# Patient Record
Sex: Female | Born: 1955
Health system: Southern US, Community
[De-identification: ages and names within clinical notes are randomized; demographics above are authoritative.]

## PROBLEM LIST (undated history)

## (undated) DIAGNOSIS — K635 Polyp of colon: Secondary | ICD-10-CM

## (undated) DIAGNOSIS — N159 Renal tubulo-interstitial disease, unspecified: Secondary | ICD-10-CM

## (undated) DIAGNOSIS — K589 Irritable bowel syndrome without diarrhea: Secondary | ICD-10-CM

## (undated) DIAGNOSIS — E890 Postprocedural hypothyroidism: Secondary | ICD-10-CM

## (undated) DIAGNOSIS — I499 Cardiac arrhythmia, unspecified: Secondary | ICD-10-CM

## (undated) DIAGNOSIS — F329 Major depressive disorder, single episode, unspecified: Secondary | ICD-10-CM

## (undated) DIAGNOSIS — M8580 Other specified disorders of bone density and structure, unspecified site: Secondary | ICD-10-CM

## (undated) DIAGNOSIS — I7300 Raynaud's syndrome without gangrene: Secondary | ICD-10-CM

## (undated) DIAGNOSIS — M1990 Unspecified osteoarthritis, unspecified site: Secondary | ICD-10-CM

## (undated) DIAGNOSIS — R002 Palpitations: Secondary | ICD-10-CM

## (undated) DIAGNOSIS — E785 Hyperlipidemia, unspecified: Secondary | ICD-10-CM

## (undated) DIAGNOSIS — M67919 Unspecified disorder of synovium and tendon, unspecified shoulder: Secondary | ICD-10-CM

## (undated) DIAGNOSIS — F32A Depression, unspecified: Secondary | ICD-10-CM

## (undated) HISTORY — DX: Major depressive disorder, single episode, unspecified: F32.9

## (undated) HISTORY — DX: Depression, unspecified: F32.A

## (undated) HISTORY — DX: Unspecified disorder of synovium and tendon, unspecified shoulder: M67.919

## (undated) HISTORY — PX: ROTATOR CUFF REPAIR: SHX139

## (undated) HISTORY — DX: Irritable bowel syndrome, unspecified: K58.9

## (undated) HISTORY — DX: Palpitations: R00.2

## (undated) HISTORY — PX: THYROID LOBECTOMY: SHX420

## (undated) HISTORY — DX: Other specified disorders of bone density and structure, unspecified site: M85.80

## (undated) HISTORY — DX: Raynaud's syndrome without gangrene: I73.00

## (undated) HISTORY — DX: Renal tubulo-interstitial disease, unspecified: N15.9

## (undated) HISTORY — DX: Cardiac arrhythmia, unspecified: I49.9

## (undated) HISTORY — DX: Postprocedural hypothyroidism: E89.0

## (undated) HISTORY — DX: Polyp of colon: K63.5

## (undated) HISTORY — DX: Hyperlipidemia, unspecified: E78.5

## (undated) HISTORY — DX: Unspecified osteoarthritis, unspecified site: M19.90

---

## 1999-03-08 ENCOUNTER — Other Ambulatory Visit: Admission: RE | Admit: 1999-03-08 | Discharge: 1999-03-08 | Payer: Self-pay | Admitting: Gynecology

## 1999-05-11 ENCOUNTER — Other Ambulatory Visit: Admission: RE | Admit: 1999-05-11 | Discharge: 1999-05-11 | Payer: Self-pay | Admitting: *Deleted

## 1999-08-21 ENCOUNTER — Other Ambulatory Visit: Admission: RE | Admit: 1999-08-21 | Discharge: 1999-08-21 | Payer: Self-pay | Admitting: Gynecology

## 2000-05-30 ENCOUNTER — Other Ambulatory Visit: Admission: RE | Admit: 2000-05-30 | Discharge: 2000-05-30 | Payer: Self-pay | Admitting: Gynecology

## 2000-06-24 ENCOUNTER — Other Ambulatory Visit: Admission: RE | Admit: 2000-06-24 | Discharge: 2000-06-24 | Payer: Self-pay | Admitting: Gynecology

## 2000-06-24 ENCOUNTER — Other Ambulatory Visit: Admission: RE | Admit: 2000-06-24 | Discharge: 2000-06-24 | Payer: Self-pay | Admitting: Internal Medicine

## 2000-06-24 ENCOUNTER — Encounter (INDEPENDENT_AMBULATORY_CARE_PROVIDER_SITE_OTHER): Payer: Self-pay | Admitting: Specialist

## 2000-09-18 ENCOUNTER — Encounter: Admission: RE | Admit: 2000-09-18 | Discharge: 2000-09-18 | Payer: Self-pay | Admitting: Gynecology

## 2000-09-18 ENCOUNTER — Encounter: Payer: Self-pay | Admitting: Gynecology

## 2001-02-19 ENCOUNTER — Ambulatory Visit (HOSPITAL_COMMUNITY): Admission: RE | Admit: 2001-02-19 | Discharge: 2001-02-19 | Payer: Self-pay | Admitting: Gynecology

## 2001-02-19 ENCOUNTER — Encounter: Payer: Self-pay | Admitting: Gynecology

## 2001-06-22 ENCOUNTER — Encounter: Payer: Self-pay | Admitting: Gynecology

## 2001-06-22 ENCOUNTER — Ambulatory Visit (HOSPITAL_COMMUNITY): Admission: AD | Admit: 2001-06-22 | Discharge: 2001-06-22 | Payer: Self-pay | Admitting: Gynecology

## 2001-07-20 ENCOUNTER — Encounter: Payer: Self-pay | Admitting: Gynecology

## 2001-07-20 ENCOUNTER — Ambulatory Visit (HOSPITAL_COMMUNITY): Admission: RE | Admit: 2001-07-20 | Discharge: 2001-07-20 | Payer: Self-pay | Admitting: Gynecology

## 2001-10-24 ENCOUNTER — Encounter: Payer: Self-pay | Admitting: Gynecology

## 2001-10-24 ENCOUNTER — Inpatient Hospital Stay (HOSPITAL_COMMUNITY): Admission: AD | Admit: 2001-10-24 | Discharge: 2001-10-24 | Payer: Self-pay | Admitting: Gynecology

## 2002-02-03 ENCOUNTER — Other Ambulatory Visit: Admission: RE | Admit: 2002-02-03 | Discharge: 2002-02-03 | Payer: Self-pay | Admitting: Gynecology

## 2003-06-16 ENCOUNTER — Encounter: Payer: Self-pay | Admitting: General Surgery

## 2003-06-16 ENCOUNTER — Encounter (INDEPENDENT_AMBULATORY_CARE_PROVIDER_SITE_OTHER): Payer: Self-pay | Admitting: Specialist

## 2003-06-16 ENCOUNTER — Observation Stay (HOSPITAL_COMMUNITY): Admission: RE | Admit: 2003-06-16 | Discharge: 2003-06-17 | Payer: Self-pay | Admitting: General Surgery

## 2003-07-28 ENCOUNTER — Other Ambulatory Visit: Admission: RE | Admit: 2003-07-28 | Discharge: 2003-07-28 | Payer: Self-pay | Admitting: Gynecology

## 2004-08-03 ENCOUNTER — Other Ambulatory Visit: Admission: RE | Admit: 2004-08-03 | Discharge: 2004-08-03 | Payer: Self-pay | Admitting: Gynecology

## 2004-08-25 ENCOUNTER — Encounter (INDEPENDENT_AMBULATORY_CARE_PROVIDER_SITE_OTHER): Payer: Self-pay | Admitting: *Deleted

## 2004-08-25 ENCOUNTER — Ambulatory Visit (HOSPITAL_COMMUNITY): Admission: RE | Admit: 2004-08-25 | Discharge: 2004-08-25 | Payer: Self-pay | Admitting: General Surgery

## 2005-08-21 ENCOUNTER — Other Ambulatory Visit: Admission: RE | Admit: 2005-08-21 | Discharge: 2005-08-21 | Payer: Self-pay | Admitting: Gynecology

## 2006-07-25 ENCOUNTER — Other Ambulatory Visit: Admission: RE | Admit: 2006-07-25 | Discharge: 2006-07-25 | Payer: Self-pay | Admitting: Endocrinology

## 2006-11-26 DIAGNOSIS — K635 Polyp of colon: Secondary | ICD-10-CM

## 2006-11-26 HISTORY — DX: Polyp of colon: K63.5

## 2006-12-20 ENCOUNTER — Other Ambulatory Visit: Admission: RE | Admit: 2006-12-20 | Discharge: 2006-12-20 | Payer: Self-pay | Admitting: Gynecology

## 2007-06-16 ENCOUNTER — Other Ambulatory Visit: Admission: RE | Admit: 2007-06-16 | Discharge: 2007-06-16 | Payer: Self-pay | Admitting: Gynecology

## 2007-12-25 ENCOUNTER — Other Ambulatory Visit: Admission: RE | Admit: 2007-12-25 | Discharge: 2007-12-25 | Payer: Self-pay | Admitting: Gynecology

## 2008-01-30 ENCOUNTER — Encounter: Admission: RE | Admit: 2008-01-30 | Discharge: 2008-01-30 | Payer: Self-pay | Admitting: Endocrinology

## 2008-01-30 ENCOUNTER — Encounter (INDEPENDENT_AMBULATORY_CARE_PROVIDER_SITE_OTHER): Payer: Self-pay | Admitting: Interventional Radiology

## 2008-01-30 ENCOUNTER — Other Ambulatory Visit: Admission: RE | Admit: 2008-01-30 | Discharge: 2008-01-30 | Payer: Self-pay | Admitting: Interventional Radiology

## 2008-05-11 ENCOUNTER — Encounter: Admission: RE | Admit: 2008-05-11 | Discharge: 2008-05-11 | Payer: Self-pay | Admitting: Gastroenterology

## 2008-07-07 ENCOUNTER — Ambulatory Visit (HOSPITAL_BASED_OUTPATIENT_CLINIC_OR_DEPARTMENT_OTHER): Admission: RE | Admit: 2008-07-07 | Discharge: 2008-07-07 | Payer: Self-pay | Admitting: Gynecology

## 2008-07-07 ENCOUNTER — Encounter: Payer: Self-pay | Admitting: Gynecology

## 2008-08-14 HISTORY — PX: DOPPLER ECHOCARDIOGRAPHY: SHX263

## 2008-11-26 HISTORY — PX: OOPHORECTOMY: SHX86

## 2009-01-03 ENCOUNTER — Other Ambulatory Visit: Admission: RE | Admit: 2009-01-03 | Discharge: 2009-01-03 | Payer: Self-pay | Admitting: Gynecology

## 2009-01-03 ENCOUNTER — Ambulatory Visit: Payer: Self-pay | Admitting: Gynecology

## 2009-01-03 ENCOUNTER — Encounter: Payer: Self-pay | Admitting: Gynecology

## 2009-01-05 ENCOUNTER — Ambulatory Visit: Payer: Self-pay | Admitting: Gynecology

## 2009-02-14 ENCOUNTER — Ambulatory Visit: Payer: Self-pay | Admitting: Gynecology

## 2009-04-20 ENCOUNTER — Encounter: Admission: RE | Admit: 2009-04-20 | Discharge: 2009-04-20 | Payer: Self-pay | Admitting: Endocrinology

## 2009-05-09 ENCOUNTER — Ambulatory Visit: Payer: Self-pay | Admitting: Gynecology

## 2009-05-10 ENCOUNTER — Ambulatory Visit: Payer: Self-pay | Admitting: Gynecology

## 2009-05-25 ENCOUNTER — Other Ambulatory Visit: Admission: RE | Admit: 2009-05-25 | Discharge: 2009-05-25 | Payer: Self-pay | Admitting: Diagnostic Radiology

## 2009-05-25 ENCOUNTER — Encounter: Admission: RE | Admit: 2009-05-25 | Discharge: 2009-05-25 | Payer: Self-pay | Admitting: Endocrinology

## 2010-01-04 ENCOUNTER — Other Ambulatory Visit: Admission: RE | Admit: 2010-01-04 | Discharge: 2010-01-04 | Payer: Self-pay | Admitting: Gynecology

## 2010-01-04 ENCOUNTER — Ambulatory Visit: Payer: Self-pay | Admitting: Gynecology

## 2010-01-19 ENCOUNTER — Ambulatory Visit: Payer: Self-pay | Admitting: Gynecology

## 2010-05-31 ENCOUNTER — Encounter: Admission: RE | Admit: 2010-05-31 | Discharge: 2010-05-31 | Payer: Self-pay | Admitting: Endocrinology

## 2010-08-08 ENCOUNTER — Ambulatory Visit: Payer: Self-pay | Admitting: Gynecology

## 2010-08-31 ENCOUNTER — Ambulatory Visit: Payer: Self-pay | Admitting: Gynecology

## 2010-09-27 ENCOUNTER — Ambulatory Visit: Payer: Self-pay | Admitting: Gynecology

## 2010-11-24 ENCOUNTER — Ambulatory Visit
Admission: RE | Admit: 2010-11-24 | Discharge: 2010-11-24 | Payer: Self-pay | Source: Home / Self Care | Attending: Gynecology | Admitting: Gynecology

## 2010-11-28 ENCOUNTER — Encounter
Admission: RE | Admit: 2010-11-28 | Discharge: 2010-11-28 | Payer: Self-pay | Source: Home / Self Care | Attending: Endocrinology | Admitting: Endocrinology

## 2011-01-16 ENCOUNTER — Other Ambulatory Visit (HOSPITAL_COMMUNITY)
Admission: RE | Admit: 2011-01-16 | Discharge: 2011-01-16 | Disposition: A | Discharge status: Home / Self Care | Payer: BC Managed Care – PPO | Source: Ambulatory Visit | Attending: Gynecology | Admitting: Gynecology

## 2011-01-16 ENCOUNTER — Other Ambulatory Visit: Payer: Self-pay | Admitting: Gynecology

## 2011-01-16 ENCOUNTER — Encounter (INDEPENDENT_AMBULATORY_CARE_PROVIDER_SITE_OTHER): Payer: BC Managed Care – PPO | Admitting: Gynecology

## 2011-01-16 ENCOUNTER — Encounter: Payer: Self-pay | Admitting: Gynecology

## 2011-01-16 DIAGNOSIS — Z01419 Encounter for gynecological examination (general) (routine) without abnormal findings: Secondary | ICD-10-CM

## 2011-01-16 DIAGNOSIS — Z124 Encounter for screening for malignant neoplasm of cervix: Secondary | ICD-10-CM | POA: Insufficient documentation

## 2011-01-16 DIAGNOSIS — B373 Candidiasis of vulva and vagina: Secondary | ICD-10-CM

## 2011-01-16 DIAGNOSIS — Z833 Family history of diabetes mellitus: Secondary | ICD-10-CM

## 2011-01-16 DIAGNOSIS — Z13220 Encounter for screening for lipoid disorders: Secondary | ICD-10-CM

## 2011-02-21 ENCOUNTER — Encounter (INDEPENDENT_AMBULATORY_CARE_PROVIDER_SITE_OTHER): Payer: BC Managed Care – PPO

## 2011-02-21 DIAGNOSIS — Z13820 Encounter for screening for osteoporosis: Secondary | ICD-10-CM

## 2011-04-10 NOTE — H&P (Signed)
NAMELedonna, [name]               ACCOUNT NO.:  [id number]   MEDICAL RECORD NO.:  [id number]          PATIENT TYPE:  AMB   LOCATION:  [facility]                         FACILITY:  [facility]   PHYSICIAN:  Juan H. Lily Peer, M.D.DATE OF BIRTH:  07/03/1956   DATE OF ADMISSION:  DATE OF DISCHARGE:                              HISTORY & PHYSICAL   HISTORY:  The patient is scheduled for a surgery, Wednesday, August 12  at 1:00 p.m. at [facility].  Please have history and  physical available.  The patient is a 55 year old gravida 2, para 2 who  is scheduled to undergo a laparoscopic right salpingo-oophorectomy,  possible laparoscopic bilateral salpingo-oophorectomy.  The patient was  seen in the office on July 31 to discuss the recent findings of the  right adnexal mass that had been noted on CT scan that had been ordered  by her gastroenterologist a result of the patient's abdominal pain.  She  subsequently was followed up in the office here with an ultrasound on  July 27 which demonstrated she had several small fibroids.  The right  ovary had a smooth wall diffuse homogeneous internal low-level echo mass  measuring 43 x 40 x 39 mm, continued with several echogenic foci within  the mass, negative color flow Doppler, free fluid around the mass.  Left  ovary had several follicles, otherwise normal.  The CA 125 was found be  elevated 87.7.  We had discussed with the patient that false-positives  and false-negatives of CA-125 and this could have been attributed to the  small fibroids that she has, nevertheless, and she was 55 years of age  and required further evaluation and possible treatment.  She was given  Depo-Provera 150 mg IM and no change in the size of the cyst from the  time of the CT scan on June 16 to the ultrasound July 30.  We have  recommended we proceed with a laparoscopy and right salpingo-  oophorectomy.  I had given her the option to consider bilateral salpingo-  oophorectomy since she is now 55 years of age.  She stated she would  like to hold on to see if she reaches any after menopause since her  mother did not reach menopause until mid 2s.  If there is any  abnormality on the contralateral ovary, she has given permission to  remove both ovaries.  She is fully aware that the ovarian mass malignant  that she would need to be scheduled for a staging laparotomy with a GYN  oncologist.  The CT scan did not demonstrate any evidence of abdominal  or pelvic lymphadenopathy.  The patient returned to the office on August  11, today, for a preop consultation and the cyst was still present  measuring 44 x 42 x 37 mm with low-level echoes and smooth wall mass.  Negative color flow Doppler.  Contralateral ovary was normal and small  fibroids were noted.   PAST MEDICAL HISTORY:  She is not allergic to any medications.  Has had  2 normal spontaneous vaginal delivery.  Has history of multinodular  goiter  in the past.  She has had twice subtotal thyroidectomy in the  past.  She is currently on Levoxyl 75 mcg daily.  She also had been  taking Aldactone 25 mg daily for fluid retention, and Motrin 800 mg  p.r.n.  She also had taken Valtrex p.r.n.   PAST SURGICAL HISTORY:  Prior surgeries have consisted of subtotal  thyroidectomy as well as a left rotator cuff repair and right thyroid  lobectomy.   FAMILY HISTORY:  Brother and aunt with osteoporosis.   PHYSICAL EXAMINATION:  VITAL SIGNS: The patient weighs 126 pounds.  Blood pressure 118/74.  She is 5 feet 4 inches tall.  HEENT: Unremarkable.  NECK: Supple.  Trachea midline.  No carotid bruits.  No thyromegaly.  LUNGS: Clear to auscultation without rhonchi or wheezes.  HEART: Regular rate and rhythm.  No murmurs or gallops.  BREAST EXAM: Unremarkable.  ABDOMEN: Soft, nontender.  No rebound or guarding.  PELVIC: Bartholin, urethra, Skene's within normal limits.  Vagina and  cervix, no lesions or discharge.   Uterus, anteverted; normal size;  shape; and consistency.  Adnexa, no palpable masses on the left but  right fullness was noted.  RECTAL EXAMINATION:  Confirmatory.   ASSESSMENT:  A 55 year old gravida 2, para 2 with a persistence of right  adnexal mass scheduled to undergo laparoscopy with right salpingo-  oophorectomy, possible bilateral salpingo-oophorectomy, and possible  laparotomy along with pelvic washings.  The risks, benefits, and pros  and cons of the operation were discussed to include infection although  she will receive prophylaxis antibiotic also deep venous thrombosis.  She will have PSA stockings for prophylaxis as well.  In the event of  any complication such as injury to the bladder; to the bowel; or  internal organs, we would need to do an open laparotomy for corrective  surgery at that point or if there any technical difficulty removing the  right adnexal mass.  The patient has opted to leave her contralateral  ovary if it appears to be normal.  She wants to go into a normal  spontaneous physiological menopause.  If the mass turns out to be  malignant, will make the appropriate referral for her to be followed by  the GYN oncologist for a detailed staging procedure.  All of the above  was discussed with the patient and her husband all questions were  answered and will follow accordingly.   PLAN:  The patient is scheduled for laparoscopy with right salpingo-  oophorectomy and pelvic washings, possible bilateral salpingo-  oophorectomy.  Please have history and physical available at [facility] for surgery scheduled on Wednesday August 12 at 1:00  p.m.      Juan H. Lily Peer, M.D.  Electronically Signed     JHF/MEDQ  D:  07/06/2008  T:  07/07/2008  Job:  [id number]

## 2011-04-10 NOTE — Op Note (Signed)
[name], [name]               ACCOUNT NO.:  [id number]   MEDICAL RECORD NO.:  [id number]          PATIENT TYPE:  AMB   LOCATION:  [facility]                         FACILITY:  [facility]   PHYSICIAN:  Juan H. Lily Peer, M.D.DATE OF BIRTH:  Aug 20, 1956   DATE OF PROCEDURE:  07/07/2008  DATE OF DISCHARGE:                               OPERATIVE REPORT   FIRST ASSISTANT:  Daniel L. Eda Paschal, M.D.   INDICATIONS FOR OPERATION:  A 55 year old gravida 2, para 2 with right  adnexal mass and lower abdominal pain and dysfunctional uterine  bleeding.   PREOPERATIVE DIAGNOSIS:  1. Right adnexal mass.  2. Dysfunctional uterine bleeding.   POSTOPERATIVE DIAGNOSIS:  1. Right endometrioma.  2. Endometriosis.  3. Pelvic adhesions.  4. Dysfunctional uterine bleeding.   PROCEDURES PERFORMED:  1. Dilatation and curettage.  2. Diagnostic laparoscopy.  3. Lysis of pelvic adhesions.  4. Right salpingo-oophorectomy.  5. Pelvic washings.   FINDINGS:  The patient was found to have a 4 cm right ovarian cyst  adhered to the right pelvic sidewall evidence of endometriosis scattered  on the periphery of the ovary as well as the anterior and posterior cul-  de-sac and some scattered speckled endometriosis on the left ovary and  some right pelvic adhesions to the adnexa.  Normal smooth liver  appearance, normal-appearing gallbladder, and appendix not visualized.   DESCRIPTION OF OPERATION:  After the patient was adequately counseled,  she was taken to the operating room where she underwent a successful  general endotracheal anesthesia.  She had received a gram of cefoxitin  for prophylaxis and PSA stockings for DVT prophylaxis as well.  She was  placed in the low lithotomy position.  The abdomen, vagina, and perineum  were prepped and draped in usual sterile fashion.  The uterus was found  to be retroverted.  It sounded to 6.5 cm and dilatation and curettage  was accomplished and tissue was submitted for  histological evaluation  for further evaluation of dysfunctional uterine bleeding.  The Hulka  tenaculum had been placed for manipulation during the laparoscopic  procedure.  A small stab incision was made in the infraumbilical region  and with the OptiVu 10/11 mm trocar was inserted into the peritoneum  under laparoscopic guidance.  This was followed by insufflation to  approximately 3 liters of carbon dioxide.  Two additional ports were  made in the right and left lower abdomen.  Under laparoscopic guidance,  whereby to 5 mm ports were placed.  A systematic inspection had  demonstrated extensive endometriosis.  The anterior and posterior cul-de-  sac as well as speckles of endometriosis on the left ovary and a right  large endometrioma adhered to the right pelvic sidewall was evident.  Pelvic washings was accomplished and was submitted for histological  evaluation.  The right tube and ovary were placed on tension to identify  the right infundibulopelvic ligament as well as the course of the right  ureter and with the tripolar cautery transection unit, the  infundibulopelvic ligament was cauterized and transected.  The remainder  of the mesosalpinx as well as the right  utero-ovarian ligament was  cauterized and transected.  The specimen was drawn from the umbilical  port after a 5-mm hysteroscope was inserted into the right lower port  and a basket retrieval with the Endopouch was accomplished to remove the  specimen and submitted for histological evaluation.  The pelvic cavity  was inspected again.  Copious irrigation of the pelvic cavity was  accomplished and Surgicel was placed at the raw surface; however, the  ovary had been adhered to the right pelvic sidewall which was coursing  very close to the ureter.  Ureter was found to be peristalsing.  There  was no injury.  The patient tolerated the procedure well.  The  instruments were removed.  The subumbilical fascia was closed with a   running stitch of 3-0 Vicryl suture and all 3 skin ports were  reapproximated with Dermabond glue.  A 0.25% Marcaine was infiltrated on  all 3 port site for postoperative analgesia.  The Hulka tenaculum was  removed.  Silver nitrate was used for one of the areas where the prong  was hugging the cervix that was bleeding a little bit and that was  contained.  The patient was awakened, transferred to recovery with  stable vital signs.  Blood loss from procedure was minimal.  Fluid  resuscitation consisted 1300 mL cc of lactated Ringer's.  Urine output  200 cc and clear.      Juan H. Lily Peer, M.D.  Electronically Signed     JHF/MEDQ  D:  07/07/2008  T:  07/08/2008  Job:  [id number]

## 2011-04-13 NOTE — Consult Note (Signed)
[facility]  PatientKHLOEI, [name] Visit Number: [id number] MRN: [id number]          Service Type: GYN Location: [facility] Attending Physician:  [name] Dictated by:   [name]. [name], M.D. Consultation Date: 10/24/01 Admit Date:  10/24/2001                            Consultation Report  CHIEF COMPLAINT:              Abdominal discomfort.  HISTORY:                      The patient is a 55 year old, gravida 2, para 2, with last menstrual period October 24, 2001.  The patient has a history of chronic anovulation and secondary infertility.  She has been under assistive reproductive technologies to include currently Pergonal 1 amp, recently released with HCG approximately six days ago.  The patient complained today of some abdominal discomfort and tinea of her abdomen and was asked to come to the emergency room to be evaluated to rule out the possibility of hyperstimulation syndrome.  She denied any history of shortness of breath.  No weakness, tiredness, or fatigue.  Urine output was reported to be normal.  She reported being afebrile.  PAST MEDICAL HISTORY:         She has a history of fibroid in the past, secondary infertility.  She has had HSV outbreak in 2001.  Denies smoking or any alcohol consumption.  ALLERGIES:                    Denies.  REVIEW OF SYSTEMS:            Otherwise unremarkable.  MEDICATIONS:                  Multivitamins and Zoloft for depression.  PHYSICAL EXAMINATION:  VITAL SIGNS:                  Temperature 98, pulse 56, respirations 20, blood pressure 103/62.  LUNGS:                        Clear to auscultation without rhonchi or wheeze.  HEART:                        Regular rate and rhythm with no murmurs or gallops.  ABDOMEN:                      Soft and mildly distended but no rebound or guarding.  Good bowel sounds.  PELVIC:                       Not done due to concerns of  hyperstimulation syndrome.  EXTREMITIES:                  Otherwise unremarkable.  LABORATORY DATA:              The patient had an ultrasound in which endometrium was normal with endometrial stripe 2.3 cm.  The right ovary measured 9.7 x 7.0 x 8.1 cm with 12 follicles, largest follicle measuring 3.5 x 3.2 x 3.5 cm.  The left ovary measured 10.4 x 6.3 x 8.3 cm with 10 follicles, the largest follicle measuring 4.2 x 3.1 x 3.9  cm.  Due to the fact that she could potentially since she was inseminated within 36 hours after recent HCG administration, the patient decided to proceed with a chest x-ray to rule out any evidence of pleural effusion as part of a potential hyperstimulation syndrome, but her abdomen was shielded.  Chest x-ray was otherwise unremarkable.  CBC demonstrated normal white blood count 9.9, hemoglobin 12.0, creatinine 35.3, platelet count 278,000.  Electrolytes including BUN and creatinine were also normal as were her liver function tests.  ASSESSMENT:                   A 55 year old gravida 2, para 2, with history of secondary infertility recently released with human chorionic gonadotropin (HCG) after Pergonal administration for several days prior to that, and she underwent a ______ approximately six days ago.  The patient hemodynamically stable and in no acute distress.  Abdomen was soft and minimally tender and minimally distended.  The patient had good bowel sounds, good urinary output, normal bowel movements, afebrile, ambulating, doing well otherwise, with the above-mentioned parameters indicating no evidence of hemoconcentration, no evidence of leukocytosis, no evidence of azotemia, large ovaries and as stable as she is, we decided to release her home to follow up in the office within 72 hours.  I will be in touch with her daily at home, and she knows to contact me as I am call this weekend if any other symptoms such as shortness of breath or increased  abdominal pain were to manifest itself.  She is to be at rest without any exertional activity, increase her fluid intake.  All this was discussed with [name], and all questions were answered and will follow accordingly.  PLAN:                         As per Assessment above.Dictated by:   [name] [name], M.D. Attending Physician:  [name] DD:  10/24/01 TD:  10/24/01 Job: [id number]

## 2011-04-13 NOTE — Op Note (Signed)
NAMEKHAMILA, BASSINGER                         ACCOUNT NO.:  [id number]   MEDICAL RECORD NO.:  [id number]                   PATIENT TYPE:  AMB   LOCATION:  DAY                                  FACILITY:  [facility]   PHYSICIAN:  Gita Kudo, M.D.              DATE OF BIRTH:  07/06/1956   DATE OF PROCEDURE:  06/16/2003  DATE OF DISCHARGE:                                 OPERATIVE REPORT   PROCEDURE:  Right thyroid lobectomy.   SURGEON:  Gita Kudo, M.D.   ASSISTANT:  Vikki Ports, M.D.   ANESTHESIA:  General endotracheal.   PREOPERATIVE DIAGNOSIS:  Mass right thyroid lobe.   POSTOPERATIVE DIAGNOSIS:  Mass right thyroid lobe, benign by frozen section,  left side felt and looked normal.   CLINICAL SUMMARY:  A 55 year old lady comes in for elective thyroid  resection. She has ultrasound consistent with a multinodular goiter but a  right side was dominant about 3.5 cm in size. A repeat thyroid ultrasound  showed an enlargement of the predominantly solid mass on the right. She has  elected for excision rather than needle aspiration.   FINDINGS:  The left lobe looked and felt totally normal. Ultrasound showed  two small nodules and I really did not see or feel them. The right lobe was  almost replaced by a dominant mass. There were no abnormal nodes in the  neck. The recurrent nerve was identified and not injured. The parathyroid  gland was adherent to the thyroid and after removing the right lobe, this  gland was taken off the thyroid gland, sliced into small bits and inserted  into the sternocleidomastoid muscle.   DESCRIPTION OF PROCEDURE:  Under satisfactory general endotracheal  anesthesia, the patient was positioned prepped and draped in a standard  fashion. A transverse neck incision was made and carried down through the  platysma with cautery for hemostasis. Superior and inferior flaps were  developed and Mahorner retractor placed for excellent exposure.  The midline  was opened and strap muscles dissected laterally and with good exposure, I  could see the right lobe of the thyroid quite well. Beginning at the middle  vein, I dissected it and divided it between clamps and metal clips. Then the  superior pole was taken down between 2-0 silk ties and metal clips. The  inferior pole was mobilized and again the inferior vein divided between  clips. Then retracting the thyroid gland medially and being careful to avoid  injury to nerves or parathyroid, the gland was dissected from the  paratracheal tissues. The inferior portion of the gland had what appeared to  be an adherent parathyroid gland. The superior gland was identified and not  injured. I removed this small possible parathyroid gland and divided it into  three little slices and placed it in the sternomastoid muscle in case they  were indeed parathyroid tissue. Then the dissection was continued and  the  isthmus was quite small and just divided with the cautery. The specimen was  sent for pathology and the wound was lavaged with saline, made dry by  cautery and clips. A light packing was placed and then the left side was  approached. Again the strap muscles were retracted laterally giving good  exposure. This gland was quite small and the lobe was carefully inspected  and palpated and I felt best to leave it along. Therefore the operative site  was again checked for hemostasis, lavaged with saline and suctioned dry and  the wound closed in layers with interrupted 3-0  Vicryl figure-of-eight midline sutures, interrupted 4-0 Vicryl platysma  suture. At this time, the report came back from pathology and we then  proceeded in closing the skin with staples and Steri-Strips. Sterile  dressings were applied and the patient went to the recovery room from the  operating room in good condition without complications.                                               Gita Kudo, M.D.     MRL/MEDQ  D:  06/16/2003  T:  06/16/2003  Job:  [id number]   cc:   Gaetano Hawthorne. Lily Peer, M.D.  [address]  [place]  [place] [address]  Fax: [phone]

## 2011-05-10 ENCOUNTER — Ambulatory Visit (INDEPENDENT_AMBULATORY_CARE_PROVIDER_SITE_OTHER): Payer: BC Managed Care – PPO | Admitting: Gynecology

## 2011-05-10 DIAGNOSIS — N8300 Follicular cyst of ovary, unspecified side: Secondary | ICD-10-CM

## 2011-05-10 DIAGNOSIS — R823 Hemoglobinuria: Secondary | ICD-10-CM

## 2011-05-10 DIAGNOSIS — R1032 Left lower quadrant pain: Secondary | ICD-10-CM

## 2011-05-10 DIAGNOSIS — D3910 Neoplasm of uncertain behavior of unspecified ovary: Secondary | ICD-10-CM

## 2011-05-10 DIAGNOSIS — N949 Unspecified condition associated with female genital organs and menstrual cycle: Secondary | ICD-10-CM

## 2011-06-11 ENCOUNTER — Ambulatory Visit (INDEPENDENT_AMBULATORY_CARE_PROVIDER_SITE_OTHER): Payer: BC Managed Care – PPO | Admitting: Gynecology

## 2011-06-11 ENCOUNTER — Other Ambulatory Visit: Payer: BC Managed Care – PPO

## 2011-06-11 DIAGNOSIS — N938 Other specified abnormal uterine and vaginal bleeding: Secondary | ICD-10-CM

## 2011-06-11 DIAGNOSIS — D252 Subserosal leiomyoma of uterus: Secondary | ICD-10-CM

## 2011-06-11 DIAGNOSIS — N949 Unspecified condition associated with female genital organs and menstrual cycle: Secondary | ICD-10-CM

## 2011-06-11 DIAGNOSIS — D259 Leiomyoma of uterus, unspecified: Secondary | ICD-10-CM

## 2011-08-16 ENCOUNTER — Other Ambulatory Visit: Payer: Self-pay | Admitting: Gynecology

## 2011-11-27 DIAGNOSIS — I499 Cardiac arrhythmia, unspecified: Secondary | ICD-10-CM

## 2011-11-27 HISTORY — DX: Cardiac arrhythmia, unspecified: I49.9

## 2012-01-03 ENCOUNTER — Other Ambulatory Visit: Payer: Self-pay | Admitting: Endocrinology

## 2012-01-03 DIAGNOSIS — E041 Nontoxic single thyroid nodule: Secondary | ICD-10-CM

## 2012-01-11 ENCOUNTER — Ambulatory Visit
Admission: RE | Admit: 2012-01-11 | Discharge: 2012-01-11 | Disposition: A | Discharge status: Home / Self Care | Payer: BC Managed Care – PPO | Source: Ambulatory Visit | Attending: Endocrinology | Admitting: Endocrinology

## 2012-01-11 DIAGNOSIS — E041 Nontoxic single thyroid nodule: Secondary | ICD-10-CM

## 2012-01-18 ENCOUNTER — Encounter: Payer: BC Managed Care – PPO | Admitting: Gynecology

## 2012-01-22 ENCOUNTER — Ambulatory Visit (INDEPENDENT_AMBULATORY_CARE_PROVIDER_SITE_OTHER): Payer: BC Managed Care – PPO | Admitting: Gynecology

## 2012-01-22 ENCOUNTER — Encounter: Payer: Self-pay | Admitting: Gynecology

## 2012-01-22 VITALS — BP 116/70 | Ht 63.75 in | Wt 136.0 lb

## 2012-01-22 DIAGNOSIS — E042 Nontoxic multinodular goiter: Secondary | ICD-10-CM | POA: Insufficient documentation

## 2012-01-22 DIAGNOSIS — Z01419 Encounter for gynecological examination (general) (routine) without abnormal findings: Secondary | ICD-10-CM

## 2012-01-22 DIAGNOSIS — N6310 Unspecified lump in the right breast, unspecified quadrant: Secondary | ICD-10-CM

## 2012-01-22 DIAGNOSIS — R635 Abnormal weight gain: Secondary | ICD-10-CM

## 2012-01-22 DIAGNOSIS — N63 Unspecified lump in unspecified breast: Secondary | ICD-10-CM

## 2012-01-22 DIAGNOSIS — IMO0001 Reserved for inherently not codable concepts without codable children: Secondary | ICD-10-CM

## 2012-01-22 DIAGNOSIS — Z0000 Encounter for general adult medical examination without abnormal findings: Secondary | ICD-10-CM

## 2012-01-22 DIAGNOSIS — Z309 Encounter for contraceptive management, unspecified: Secondary | ICD-10-CM

## 2012-01-22 LAB — CBC WITH DIFFERENTIAL/PLATELET
Basophils Absolute: 0 10*3/uL (ref 0.0–0.1)
Basophils Relative: 0 % (ref 0–1)
Eosinophils Absolute: 0.1 10*3/uL (ref 0.0–0.7)
Eosinophils Relative: 1 % (ref 0–5)
HCT: 37.4 % (ref 36.0–46.0)
Lymphocytes Relative: 27 % (ref 12–46)
Lymphs Abs: 2 10*3/uL (ref 0.7–4.0)
MCH: 28.4 pg (ref 26.0–34.0)
Neutro Abs: 4.8 10*3/uL (ref 1.7–7.7)
WBC: 7.4 10*3/uL (ref 4.0–10.5)

## 2012-01-22 MED ORDER — NORETHIN-ETH ESTRAD-FE BIPHAS 1 MG-10 MCG / 10 MCG PO TABS: 1.0000 | ORAL_TABLET | Freq: Every day | ORAL | Status: DC

## 2012-01-22 NOTE — Patient Instructions (Signed)
Remember to start your calcium/vitamin D (either caltrate plus, or oscal, or citracal or viactiv). Take one daily. Will call you this week for ultrasound appointment

## 2012-01-22 NOTE — Progress Notes (Signed)
[name] 1956/09/23 [id number]   History:    56 y.o.  for annual exam who has been followed by Dr. Lurene Shadow for her nontoxic multinodular goiter. Patient stated recent thyroid function tests were normal. Patient with recent thyroid ultrasound stable left thyroid nodule. Patient's currently on no medication at the present time. Her complaint is been gaining some weight. Review of her record indicated she was weighing 129 last year and is weighing 136 now. Her BMI is less than 25. Her last Pap smear was in 2012 which was normal as was her last bone density study. Her last colonoscopy was negative in 2012. She has been on low low Estrin oral contraceptive pill and she states she is having normal menstrual cycles. In July of 2012 she had a normal FSH.  Past medical history,surgical history, family history and social history were all reviewed and documented in the EPIC chart.  Gynecologic History Patient's last menstrual period was 01/15/2012. Contraception: OCP (estrogen/progesterone) Last Pap: 2012. Results were: normal Last mammogram: 2012. Results were: normal  Obstetric History OB History    Grav Para Term Preterm Abortions TAB SAB Ect Mult Living   2 2 2       2      # Outc Date GA Lbr Len/2nd Wgt Sex Del Anes PTL Lv   1 TRM     F SVD  No Yes   2 TRM     M SVD  No Yes       ROS:  Was performed and pertinent positives and negatives are included in the history.  Exam: chaperone present  BP 116/70  Ht 5' 3.75" (1.619 m)  Wt 136 lb (61.689 kg)  BMI 23.53 kg/m2  LMP 01/15/2012  Body mass index is 23.53 kg/(m^2).  General appearance : Well developed well nourished female. No acute distress HEENT: Neck supple, trachea midline, no carotid bruits, no thyroidmegaly, small left inferior thyroid nodule Lungs: Clear to auscultation, no rhonchi or wheezes, or rib retractions  Heart: Regular rate and rhythm, no murmurs or gallops Breast:Examined in sitting and supine position were  symmetrical in appearance, no palpable masses on the left breast or tenderness,  no skin retraction, no nipple inversion, no nipple discharge, no skin discoloration, no axillary or supraclavicular lymphadenopathy. On the right breast at the 4:00 position in the periareolar region there was a 1 cm nontender mobile nodule Abdomen: no palpable masses or tenderness, no rebound or guarding Extremities: no edema or skin discoloration or tenderness  Pelvic:  Bartholin, Urethra, Skene Glands: Within normal limits             Vagina: No gross lesions or discharge  Cervix: No gross lesions or discharge  Uterus  retroverted, normal size, shape and consistency, non-tender and mobile  Adnexa  Without masses or tenderness  Anus and perineum  normal   Rectovaginal  normal sphincter tone without palpated masses or tenderness             Hemoccult cards given to the patient to submit to the office for testing     Assessment/Plan:  56 y.o. female for annual exam with incidental findings today of a right breast nodule at the 4:00 position in the periareolar region approximately 1 cm in size mobile and nontender. She will be sent for a diagnostic mammogram. A CBC cholesterol along with a hemoglobin A1c was done today. Thyroid function test done by an endocrinologist recently. Next year we will take patient off the oral contraceptive pill  and check her FSH level. Fecal occult blood testing cards were given to the patient to submit to the office for testing. She was encouraged to start taking her calcium and vitamin D for osteoporosis prevention.    Ok Edwards MD, 4:53 PM 01/22/2012

## 2012-01-23 ENCOUNTER — Other Ambulatory Visit: Payer: Self-pay

## 2012-01-23 ENCOUNTER — Other Ambulatory Visit: Payer: Self-pay | Admitting: *Deleted

## 2012-01-23 ENCOUNTER — Telehealth: Payer: Self-pay | Admitting: *Deleted

## 2012-01-23 DIAGNOSIS — R7309 Other abnormal glucose: Secondary | ICD-10-CM

## 2012-01-23 DIAGNOSIS — N63 Unspecified lump in unspecified breast: Secondary | ICD-10-CM

## 2012-01-23 LAB — HEMOGLOBIN A1C: Mean Plasma Glucose: 117 mg/dL — ABNORMAL HIGH (ref <117)

## 2012-01-23 LAB — URINALYSIS W MICROSCOPIC + REFLEX CULTURE
Casts: NONE SEEN
Crystals: NONE SEEN
Glucose, UA: NEGATIVE mg/dL
Nitrite: NEGATIVE
Protein, ur: NEGATIVE mg/dL
Squamous Epithelial / LPF: NONE SEEN
Urobilinogen, UA: 0.2 mg/dL (ref 0.0–1.0)
pH: 6.5 (ref 5.0–8.0)

## 2012-01-23 NOTE — Telephone Encounter (Signed)
Patient informed appt set up at [facility] for 01/28/12 @ 9:30am. Order faxed

## 2012-01-23 NOTE — Telephone Encounter (Signed)
Message copied by Libby Maw on Wed Jan 23, 2012 12:28 PM ------      Message from: Ok Edwards      Created: Tue Jan 22, 2012  4:58 PM       Babbette Dalesandro, please schedule a diagnostic mammogram of the right breast for this patient at [facility] breast imaging Center. Patient with right breast nodule 4:00 position periareolar region. She had her scan there done last year. She'll need a screening mammogram on the contralateral breast. Thank you

## 2012-01-24 ENCOUNTER — Other Ambulatory Visit: Payer: BC Managed Care – PPO

## 2012-01-24 DIAGNOSIS — R7309 Other abnormal glucose: Secondary | ICD-10-CM

## 2012-01-24 LAB — GLUCOSE, RANDOM: Glucose, Bld: 90 mg/dL (ref 70–99)

## 2012-01-31 ENCOUNTER — Other Ambulatory Visit: Payer: Self-pay | Admitting: Gynecology

## 2012-01-31 DIAGNOSIS — N63 Unspecified lump in unspecified breast: Secondary | ICD-10-CM

## 2012-02-29 DIAGNOSIS — Z1211 Encounter for screening for malignant neoplasm of colon: Secondary | ICD-10-CM

## 2012-03-03 ENCOUNTER — Other Ambulatory Visit: Payer: Self-pay | Admitting: Anesthesiology

## 2012-03-03 ENCOUNTER — Other Ambulatory Visit: Payer: Self-pay | Admitting: Gynecology

## 2012-03-03 DIAGNOSIS — Z1211 Encounter for screening for malignant neoplasm of colon: Secondary | ICD-10-CM

## 2012-03-10 ENCOUNTER — Ambulatory Visit (INDEPENDENT_AMBULATORY_CARE_PROVIDER_SITE_OTHER): Payer: BC Managed Care – PPO | Admitting: Gynecology

## 2012-03-10 ENCOUNTER — Other Ambulatory Visit: Payer: Self-pay | Admitting: Gynecology

## 2012-03-10 ENCOUNTER — Ambulatory Visit (HOSPITAL_COMMUNITY)
Admission: RE | Admit: 2012-03-10 | Discharge: 2012-03-10 | Disposition: A | Discharge status: Home / Self Care | Payer: BC Managed Care – PPO | Source: Ambulatory Visit | Attending: Gynecology | Admitting: Gynecology

## 2012-03-10 ENCOUNTER — Other Ambulatory Visit: Payer: Self-pay | Admitting: *Deleted

## 2012-03-10 ENCOUNTER — Telehealth: Payer: Self-pay | Admitting: *Deleted

## 2012-03-10 DIAGNOSIS — N951 Menopausal and female climacteric states: Secondary | ICD-10-CM

## 2012-03-10 DIAGNOSIS — M79606 Pain in leg, unspecified: Secondary | ICD-10-CM

## 2012-03-10 DIAGNOSIS — M79609 Pain in unspecified limb: Secondary | ICD-10-CM

## 2012-03-10 DIAGNOSIS — Z780 Asymptomatic menopausal state: Secondary | ICD-10-CM

## 2012-03-10 DIAGNOSIS — M549 Dorsalgia, unspecified: Secondary | ICD-10-CM

## 2012-03-10 DIAGNOSIS — I8390 Asymptomatic varicose veins of unspecified lower extremity: Secondary | ICD-10-CM | POA: Insufficient documentation

## 2012-03-10 DIAGNOSIS — N390 Urinary tract infection, site not specified: Secondary | ICD-10-CM

## 2012-03-10 DIAGNOSIS — R300 Dysuria: Secondary | ICD-10-CM

## 2012-03-10 DIAGNOSIS — M79604 Pain in right leg: Secondary | ICD-10-CM

## 2012-03-10 LAB — URINALYSIS W MICROSCOPIC + REFLEX CULTURE
Nitrite: NEGATIVE
Protein, ur: 100 mg/dL — AB
pH: 7 (ref 5.0–8.0)

## 2012-03-10 MED ORDER — CIPROFLOXACIN HCL 250 MG PO TABS: 250.0000 mg | ORAL_TABLET | Freq: Two times a day (BID) | ORAL | Status: AC

## 2012-03-10 NOTE — Telephone Encounter (Signed)
Pt was sent to [facility] vascular clinic for Doppler study for right lower extremity today, vascular clinic called and said that the study was negative for right leg DVT.  Report will be up soon.

## 2012-03-10 NOTE — Patient Instructions (Signed)
[name], here Doppler study of the lower extremity will be done this afternoon at [facility]. They will call me with a result this afternoon and I will contact you later. Don't start her oral contraceptive pill on Sunday and come by the office on Monday to check her [facility] and a urinalysis. If you do not hear from by the middle the week and a Doppler study is negative you can stay on the low dose oral contraceptive pill until we retest her [facility] in one year. During clear fluids. If he have worsening back pain or fever please report to the office during office hours or if not to the emergency room. She should start to have some improvement in the next 24-48 hours.

## 2012-03-10 NOTE — Progress Notes (Signed)
Patient presented to the office today stating that she woke up early this morning with dysuria and frequency and noticed blood in her urine. She has no prior history of kidney stones. She is currently on the low dose oral contraceptive pill and is not heavy menstrual cycle. She had some mild flank discomfort but no fever reported or nausea or vomiting. She was also complaining of cramping in her lower right thigh and the or the back of her knee. She does have history of varicosities. She has not had this lower extremity discomfort and quite some time.  Exam: Back: No CVA tenderness Abdomen: Tender suprapubically Lower extremities: Tender right lower medial thigh and knee or popliteal fossa area. Bilateral equal popliteal pulses and bilateral equal dorsalis pedis and medial malleolus pulses. Equal temperature in size on both lower extremities and size. Some varicosity was noted in her right thigh posterior. There was negative Homan sign. There was no edema. There was no discoloration of the lower extremity.  Urinalysis: Too numerous to count WBC, too numerous to count RBCs, too numerous to count bacteria  Assessment/plan: Urinary tract infection patient will be treated with Cipro 250 mg twice a day for 5 days. Uribell 1 by mouth 3 times a day for 2-3 days for discomfort. She was instructed to increase her fluid intake. She will be sent to the [facility]  vascular clinic for Doppler study of the right lower extremity this afternoon. She will return to the office Monday of next week when she has been off the oral contraceptive pill so that we can check her [facility]. If her FSH is normal she will continue the low low Estrin oral contraceptive pill benefits elevated she will discontinue it. Her discomfort may be from her varicosity but due to her age and being a low dose oral contraceptive pill a DVT needs to be ruled out.

## 2012-03-10 NOTE — Telephone Encounter (Signed)
Patient was contacted and informed that the Doppler flow study was negative of the right leg.

## 2012-03-10 NOTE — Progress Notes (Signed)
*  PRELIMINARY RESULTS* Vascular Ultrasound Right lower extremity venous duplex has been completed.  Preliminary findings: Right= No evidence of DVT or baker's cyst.  Farrel Demark, RDMS     03/10/2012, 3:49 PM

## 2012-03-13 ENCOUNTER — Other Ambulatory Visit: Payer: Self-pay | Admitting: Gynecology

## 2012-03-17 ENCOUNTER — Other Ambulatory Visit: Payer: BC Managed Care – PPO

## 2012-03-17 DIAGNOSIS — Z780 Asymptomatic menopausal state: Secondary | ICD-10-CM

## 2012-03-17 DIAGNOSIS — N390 Urinary tract infection, site not specified: Secondary | ICD-10-CM

## 2012-03-18 ENCOUNTER — Encounter: Payer: BC Managed Care – PPO | Admitting: Vascular Surgery

## 2012-03-18 LAB — URINALYSIS W MICROSCOPIC + REFLEX CULTURE
Bilirubin Urine: NEGATIVE
Crystals: NONE SEEN
Glucose, UA: NEGATIVE mg/dL
Specific Gravity, Urine: 1.019 (ref 1.005–1.030)

## 2012-05-08 ENCOUNTER — Telehealth: Payer: Self-pay | Admitting: *Deleted

## 2012-05-08 NOTE — Telephone Encounter (Signed)
Pt is currently taking lo Loestrin since feb/2013, pt would like to stop taking pills now due to weight gain. Pt said she has gained about 12 pounds this year. At feb ov pt was going to stop taking pills next year and recheck [facility], pt would like to stop now.  When should pt come in for [place] recheck? Please advise

## 2012-05-08 NOTE — Telephone Encounter (Signed)
She can finish current birth control pill pack. Use barrier contraception and returned back to the office in one month after discontinuation to check an [facility] level to see if she is menopausal.

## 2012-05-08 NOTE — Telephone Encounter (Signed)
Pt informed with the below note, order already in computer, she will do as directed.

## 2012-05-28 ENCOUNTER — Ambulatory Visit
Admission: RE | Admit: 2012-05-28 | Discharge: 2012-05-28 | Disposition: A | Discharge status: Home / Self Care | Payer: BC Managed Care – PPO | Source: Ambulatory Visit | Attending: Endocrinology | Admitting: Endocrinology

## 2012-05-28 ENCOUNTER — Other Ambulatory Visit: Payer: Self-pay | Admitting: Endocrinology

## 2012-05-28 DIAGNOSIS — E041 Nontoxic single thyroid nodule: Secondary | ICD-10-CM

## 2012-06-02 ENCOUNTER — Encounter: Payer: Self-pay | Admitting: Vascular Surgery

## 2012-06-02 ENCOUNTER — Other Ambulatory Visit (HOSPITAL_COMMUNITY): Payer: Self-pay | Admitting: Endocrinology

## 2012-06-02 DIAGNOSIS — E049 Nontoxic goiter, unspecified: Secondary | ICD-10-CM

## 2012-06-03 ENCOUNTER — Ambulatory Visit (INDEPENDENT_AMBULATORY_CARE_PROVIDER_SITE_OTHER): Payer: BC Managed Care – PPO | Admitting: Vascular Surgery

## 2012-06-03 ENCOUNTER — Ambulatory Visit (INDEPENDENT_AMBULATORY_CARE_PROVIDER_SITE_OTHER): Payer: BC Managed Care – PPO

## 2012-06-03 ENCOUNTER — Encounter: Payer: Self-pay | Admitting: Vascular Surgery

## 2012-06-03 VITALS — BP 119/66 | HR 65 | Resp 16 | Ht 64.0 in | Wt 135.0 lb

## 2012-06-03 DIAGNOSIS — I83893 Varicose veins of bilateral lower extremities with other complications: Secondary | ICD-10-CM | POA: Insufficient documentation

## 2012-06-03 NOTE — Progress Notes (Signed)
Subjective:     Patient ID: [name], female   DOB: 1956-09-02, 56 y.o.   MRN: [id number]  HPI this 56 year old female is evaluated for venous insufficiency of the right leg. She has had a painful longitudinal varix beginning in the proximal posterior thigh on the right leg which extends down lateral to her knee into the calf. This has been present since her last child was born 21 years ago. He has been gradually enlarging and becoming more painful. About causes burning stinging and itching discomfort. She has no distal edema. She has no history of DVT or thrombophlebitis. She has had this previously evaluated but not treated.  Past Medical History  Diagnosis Date  . Rotator cuff disorder     TEAR-  . Multinodular goiter   . Depression     PMDD  . Colon polyps 2008    BENIGN  . Raynaud's phenomenon     History  Substance Use Topics  . Smoking status: Never Smoker   . Smokeless tobacco: Never Used  . Alcohol Use: Yes     occ, two glasses of wine a week    Family History  Problem Relation Age of Onset  . Osteoporosis Father   . Hypertension Father   . Hypertension Mother     No Known Allergies  Current outpatient prescriptions:Amphetamine-Dextroamphetamine (ADDERALL PO), Take 15 mg by mouth daily. , Disp: , Rfl: ;  cetirizine (ZYRTEC) 10 MG tablet, Take 10 mg by mouth daily., Disp: , Rfl: ;  fish oil-omega-3 fatty acids 1000 MG capsule, Take 2 g by mouth daily.  , Disp: , Rfl: ;  Ginger, Zingiber officinalis, (GINGER PO), Take by mouth.  , Disp: , Rfl:  glucosamine-chondroitin 500-400 MG tablet, Take 1 tablet by mouth 3 (three) times daily.  , Disp: , Rfl: ;  Misc Natural Products (TART CHERRY ADVANCED PO), Take by mouth.  , Disp: , Rfl: ;  Sertraline HCl (ZOLOFT PO), Take 75 mg by mouth daily. , Disp: , Rfl: ;  valACYclovir (VALTREX) 1000 MG tablet, TAKE 1 TABLET BY MOUTH DAILY FOR 7 DAYS, Disp: 7 tablet, Rfl: 5;  ZOVIRAX 5 %, apply twice a day for 7 to 10 days, Disp: 2 g,  Rfl: 5 Norethindrone-Ethinyl Estradiol-Fe Biphas (LO LOESTRIN FE) 1 MG-10 MCG / 10 MCG tablet, Take 1 tablet by mouth daily., Disp: 1 Package, Rfl: 11  BP 119/66  Pulse 65  Resp 16  Ht 5\' 4"  (1.626 m)  Wt 135 lb (61.236 kg)  BMI 23.17 kg/m2  Body mass index is 23.17 kg/(m^2).           Review of Systems denies chest pain, dyspnea on exertion, PND, orthopnea, hemoptysis, claudication     Objective:   Physical Exam blood pressure 119/66 heart rate 60 respirations 16 Gen.-alert and oriented x3 in no apparent distress HEENT normal for age Lungs no rhonchi or wheezing Cardiovascular regular rhythm no murmurs carotid pulses 3+ palpable no bruits audible Abdomen soft nontender no palpable masses Musculoskeletal free of  major deformities Skin clear -no rashes Neurologic normal Lower extremities 3+ femoral and dorsalis pedis pulses palpable bilaterally with no edema Right leg has a linear varix which extends from the proximal posterior thigh medially extending laterally across the thigh to the lateral distal thigh and lateral to the calf area. There are no spider veins or other bulging varicosities noted. There is no distal edema. There is no hyperpigmentation or ulceration.  Today I ordered a venous duplex exam  which I reviewed and interpreted. The right great saphenous vein has a few areas of mild reflux but nothing significant. Right small saphenous vein is normal. There is a large meandering varix beginning in the proximal thigh which has reflux throughout which seems to be fed by a perforating branch in the proximal thigh. There is no DVT.       Assessment:     Venous hypertension and posterior varicosity connected to incompetent perforating branch and proximal thigh    Plan:     Sclerotherapy would be best option. Patient will decide whether she would like to proceed with this

## 2012-06-05 ENCOUNTER — Encounter (HOSPITAL_COMMUNITY)
Admission: RE | Admit: 2012-06-05 | Discharge: 2012-06-05 | Disposition: A | Discharge status: Home / Self Care | Payer: BC Managed Care – PPO | Source: Ambulatory Visit | Attending: Endocrinology | Admitting: Endocrinology

## 2012-06-05 DIAGNOSIS — E049 Nontoxic goiter, unspecified: Secondary | ICD-10-CM

## 2012-06-06 ENCOUNTER — Encounter: Payer: Self-pay | Admitting: *Deleted

## 2012-06-06 ENCOUNTER — Encounter (HOSPITAL_COMMUNITY): Payer: Self-pay

## 2012-06-06 ENCOUNTER — Encounter (HOSPITAL_COMMUNITY)
Admission: RE | Admit: 2012-06-06 | Discharge: 2012-06-06 | Disposition: A | Discharge status: Home / Self Care | Payer: BC Managed Care – PPO | Source: Ambulatory Visit | Attending: Endocrinology | Admitting: Endocrinology

## 2012-06-06 MED ORDER — SODIUM IODIDE I 131 CAPSULE
8.5000 | Freq: Once | INTRAVENOUS | Status: AC | PRN
  Administered 2012-06-05: 8.5 via ORAL

## 2012-06-06 MED ORDER — SODIUM PERTECHNETATE TC 99M INJECTION
10.2000 | Freq: Once | INTRAVENOUS | Status: AC | PRN
  Administered 2012-06-06: 10.2 via INTRAVENOUS

## 2012-06-09 ENCOUNTER — Ambulatory Visit (INDEPENDENT_AMBULATORY_CARE_PROVIDER_SITE_OTHER): Payer: BC Managed Care – PPO | Admitting: *Deleted

## 2012-06-09 DIAGNOSIS — I781 Nevus, non-neoplastic: Secondary | ICD-10-CM

## 2012-06-09 NOTE — Progress Notes (Signed)
X=.5% Sotradecol administered with a 27g butterfly.  Patient received a total of 12cc. Patient came in to have large reticular on the back of her right thigh injected. She was seen by Dr. Hart Rochester last week and had a reflux study done. Also noticed a smaller reticular on the back of her left thigh which she also wanted me to inject once I showed it to her. Easy access. Pt tolerated the few sticks well. Will follow prn.  Cutaneous Laser:pulsed mode  2.8 watts on a tiny vessel in her nose    Photos: yes  Compression stockings applied: yes size small

## 2012-06-11 ENCOUNTER — Encounter: Payer: Self-pay | Admitting: Vascular Surgery

## 2012-06-11 NOTE — Procedures (Unsigned)
LOWER EXTREMITY VENOUS REFLUX EXAM  INDICATION:  Lower extremity varicose veins with pain and inflammation.  EXAM:  Using color-flow imaging and pulse Doppler spectral analysis, the bilateral common femoral, superficial femoral, popliteal, posterior tibial, greater and lesser saphenous veins are evaluated.  There is evidence suggesting mild deep venous insufficiency in the bilateral lower extremities in the common femoral vein.  The right saphenofemoral junction is not competent with reflux of >500 milliseconds.  The left saphenofemoral junction is competent.  The right GSV is not competent with Reflux of >500 milliseconds with the caliber as described below.  The left GSV is competent.   The bilateral proximal short saphenous vein demonstrates competency.  GSV Diameter (used if found to be incompetent only)                                           Right    Left Proximal Greater Saphenous Vein           0.67 cm  cm Proximal-to-mid-thigh                     0.40 cm  cm Mid thigh                                 0.31 cm  cm Mid-distal thigh                          cm       cm Distal thigh                              0.38 cm  cm Knee                                      0.33 cm  cm  IMPRESSION: 1. The right great saphenous vein is not competent with reflux >500     milliseconds.  The left greater saphenous vein is competent. 2. The bilateral greater saphenous veins are not tortuous. 3. The deep venous system is not competent with reflux of >500     milliseconds in the common femoral veins bilaterally and at the     right saphenofemoral junction. 4. The bilateral small saphenous veins are competent. 5. There is a tortuous incompetent branch along the posterior right     thigh and calf.  An incompetent perforator is noted in the proximal     to mid posterior thigh. 6. No evidence of lower extremity deep venous thrombosis  bilaterally.  ___________________________________________ Quita Skye Hart Rochester, M.D.  CI/MEDQ  D:  06/03/2012  T:  06/03/2012  Job:  [id number]

## 2012-06-16 ENCOUNTER — Other Ambulatory Visit (HOSPITAL_COMMUNITY): Payer: Self-pay | Admitting: Endocrinology

## 2012-06-18 ENCOUNTER — Other Ambulatory Visit: Payer: Self-pay | Admitting: Endocrinology

## 2012-06-18 DIAGNOSIS — E0590 Thyrotoxicosis, unspecified without thyrotoxic crisis or storm: Secondary | ICD-10-CM

## 2012-07-04 ENCOUNTER — Encounter (HOSPITAL_COMMUNITY): Payer: Self-pay

## 2012-07-04 ENCOUNTER — Encounter (HOSPITAL_COMMUNITY)
Admission: RE | Admit: 2012-07-04 | Discharge: 2012-07-04 | Disposition: A | Discharge status: Home / Self Care | Payer: BC Managed Care – PPO | Source: Ambulatory Visit | Attending: Endocrinology | Admitting: Endocrinology

## 2012-07-04 DIAGNOSIS — E0590 Thyrotoxicosis, unspecified without thyrotoxic crisis or storm: Secondary | ICD-10-CM | POA: Insufficient documentation

## 2012-07-04 MED ORDER — SODIUM IODIDE I 131 CAPSULE
30.5000 | Freq: Once | INTRAVENOUS | Status: AC | PRN
  Administered 2012-07-04: 30.5 via ORAL

## 2012-08-15 ENCOUNTER — Other Ambulatory Visit: Payer: Self-pay | Admitting: Gynecology

## 2012-09-04 HISTORY — PX: NM MYOCAR PERF WALL MOTION: HXRAD629

## 2012-11-14 ENCOUNTER — Other Ambulatory Visit: Payer: Self-pay

## 2013-01-22 ENCOUNTER — Other Ambulatory Visit: Payer: Self-pay | Admitting: *Deleted

## 2013-01-22 DIAGNOSIS — N63 Unspecified lump in unspecified breast: Secondary | ICD-10-CM

## 2013-01-23 ENCOUNTER — Encounter: Payer: Self-pay | Admitting: Gynecology

## 2013-01-23 ENCOUNTER — Ambulatory Visit (INDEPENDENT_AMBULATORY_CARE_PROVIDER_SITE_OTHER): Payer: BC Managed Care – PPO | Admitting: Gynecology

## 2013-01-23 VITALS — BP 116/74 | Ht 63.5 in | Wt 138.0 lb

## 2013-01-23 DIAGNOSIS — Z23 Encounter for immunization: Secondary | ICD-10-CM

## 2013-01-23 DIAGNOSIS — Z780 Asymptomatic menopausal state: Secondary | ICD-10-CM

## 2013-01-23 DIAGNOSIS — Z01419 Encounter for gynecological examination (general) (routine) without abnormal findings: Secondary | ICD-10-CM

## 2013-01-23 DIAGNOSIS — N938 Other specified abnormal uterine and vaginal bleeding: Secondary | ICD-10-CM

## 2013-01-23 DIAGNOSIS — N949 Unspecified condition associated with female genital organs and menstrual cycle: Secondary | ICD-10-CM

## 2013-01-23 DIAGNOSIS — R634 Abnormal weight loss: Secondary | ICD-10-CM

## 2013-01-23 LAB — CBC WITH DIFFERENTIAL/PLATELET
Eosinophils Relative: 1 % (ref 0–5)
HCT: 35 % — ABNORMAL LOW (ref 36.0–46.0)
Lymphocytes Relative: 30 % (ref 12–46)
Lymphs Abs: 1.5 10*3/uL (ref 0.7–4.0)
MCV: 85.4 fL (ref 78.0–100.0)
Monocytes Absolute: 0.4 10*3/uL (ref 0.1–1.0)
Platelets: 307 10*3/uL (ref 150–400)
RBC: 4.1 MIL/uL (ref 3.87–5.11)
WBC: 5.1 10*3/uL (ref 4.0–10.5)

## 2013-01-23 NOTE — Patient Instructions (Addendum)

## 2013-01-23 NOTE — Progress Notes (Signed)
[name] 07-10-1956 [id number]   History:    57 y.o.  for annual gyn exam with complaint of 2 periods per month recently. She has otherwise been regular. Her cycles have been lasting approximately 7 days and heavy recently. Patient not using any form of contraception. Patient has been followed by her endocrinologist Dr. Lurene Shadow for her nontoxic multinodular goiter.patient informed me that in August of 2013 she had iodine 131 treatment for her thyroid. She is currently on levothyroid 100 mcg daily. Dr. Lurene Shadow recently did her thyroid function test results pending. Patient prior to that has had a subtotal thyroidectomy in the past. Patient has been followed by Dr. Nanetta Batty cardiologist for some form of mild cardiac arrhythmia for which she's currently on Toprol.  Review of her record indicated also that she has had laparoscopic right salpingo-oophorectomy in the past and has had a history of stage IV endometriosis. She also suffers from HSV but infrequently. Patient would know prior history of abnormal Pap smears. Patient denies any vasomotor symptoms. She had a Mirena IUD removed in 2013. Last bone density study was normal 2012. Review of her record indicated that she had an elevated FSH in 2013.   Past medical history,surgical history, family history and social history were all reviewed and documented in the EPIC chart.  Gynecologic History Patient's last menstrual period was 01/11/2013. Contraception: none Last Pap: 2012. Results were: normal Last mammogram: 2013. Results were: normal  Obstetric History OB History   Grav Para Term Preterm Abortions TAB SAB Ect Mult Living   2 2 2       2      # Outc Date GA Lbr Len/2nd Wgt Sex Del Anes PTL Lv   1 TRM     F SVD  No Yes   2 TRM     M SVD  No Yes       ROS: A ROS was performed and pertinent positives and negatives are included in the history.  GENERAL: No fevers or chills. HEENT: No change in vision, no earache, sore throat or  sinus congestion. NECK: No pain or stiffness. CARDIOVASCULAR: No chest pain or pressure. No palpitations. PULMONARY: No shortness of breath, cough or wheeze. GASTROINTESTINAL: No abdominal pain, nausea, vomiting or diarrhea, melena or bright red blood per rectum. GENITOURINARY: No urinary frequency, urgency, hesitancy or dysuria. MUSCULOSKELETAL: No joint or muscle pain, no back pain, no recent trauma. DERMATOLOGIC: No rash, no itching, no lesions. ENDOCRINE: No polyuria, polydipsia, no heat or cold intolerance. No recent change in weight. HEMATOLOGICAL: No anemia or easy bruising or bleeding. NEUROLOGIC: No headache, seizures, numbness, tingling or weakness. PSYCHIATRIC: No depression, no loss of interest in normal activity or change in sleep pattern.     Exam: chaperone present  BP 116/74  Ht 5' 3.5" (1.613 m)  Wt 138 lb (62.596 kg)  BMI 24.06 kg/m2  LMP 01/11/2013  Body mass index is 24.06 kg/(m^2).  General appearance : Well developed well nourished female. No acute distress HEENT: Neck supple, trachea midline, no carotid bruits, no thyroidmegaly Lungs: Clear to auscultation, no rhonchi or wheezes, or rib retractions  Heart: Regular rate and rhythm, no murmurs or gallops Breast:Examined in sitting and supine position were symmetrical in appearance, no palpable masses or tenderness,  no skin retraction, no nipple inversion, no nipple discharge, no skin discoloration, no axillary or supraclavicular lymphadenopathy Abdomen: no palpable masses or tenderness, no rebound or guarding Extremities: no edema or skin discoloration or tenderness  Pelvic:  Bartholin, Urethra, Skene Glands: Within normal limits             Vagina: No gross lesions or discharge  Cervix: No gross lesions or discharge  Uterus  anteverted, normal size, shape and consistency, non-tender and mobile  Adnexa  Without masses or tenderness  Anus and perineum  normal   Rectovaginal  normal sphincter tone without palpated  masses or tenderness             Hemoccult cards will be provided to submit to the office for testing     Assessment/Plan:  57 y.o. female for annual exam who is 57 years of age still menstruating. Her cycles have been regular until recently whereby she had 2 periods and now they're lasting 7 days and very heavy. Patient underwent endometrial biopsy today in the office and tissue was submitted for histological evaluation. Patient will return back next week for sonohysterogram to complete evaluation. No Pap smear done today the new guidelines discussed. The following labs were done: CBC, screening cholesterol, FSH, hemoglobin A1c, urinalysis. She was given Hemoccult card system it to the office for testing. She received her Tdap vaccine today. She will schedule her bone density study for April. She was reminded her monthly self breast examination. She exercises regularly and would reminded her on the importance of calcium and vitamin D.    Ok Edwards MD, 10:04 AM 01/23/2013

## 2013-01-24 LAB — URINALYSIS W MICROSCOPIC + REFLEX CULTURE
Bilirubin Urine: NEGATIVE
Crystals: NONE SEEN
Leukocytes, UA: NEGATIVE
Nitrite: NEGATIVE
Specific Gravity, Urine: 1.016 (ref 1.005–1.030)
Urobilinogen, UA: 0.2 mg/dL (ref 0.0–1.0)

## 2013-01-28 ENCOUNTER — Other Ambulatory Visit: Payer: Self-pay | Admitting: Gynecology

## 2013-01-28 DIAGNOSIS — N63 Unspecified lump in unspecified breast: Secondary | ICD-10-CM

## 2013-02-04 ENCOUNTER — Ambulatory Visit (INDEPENDENT_AMBULATORY_CARE_PROVIDER_SITE_OTHER): Payer: BC Managed Care – PPO

## 2013-02-04 ENCOUNTER — Other Ambulatory Visit: Payer: Self-pay | Admitting: Gynecology

## 2013-02-04 ENCOUNTER — Ambulatory Visit (INDEPENDENT_AMBULATORY_CARE_PROVIDER_SITE_OTHER): Payer: BC Managed Care – PPO | Admitting: Gynecology

## 2013-02-04 DIAGNOSIS — N83339 Acquired atrophy of ovary and fallopian tube, unspecified side: Secondary | ICD-10-CM

## 2013-02-04 DIAGNOSIS — N83209 Unspecified ovarian cyst, unspecified side: Secondary | ICD-10-CM

## 2013-02-04 DIAGNOSIS — Z780 Asymptomatic menopausal state: Secondary | ICD-10-CM

## 2013-02-04 DIAGNOSIS — N924 Excessive bleeding in the premenopausal period: Secondary | ICD-10-CM

## 2013-02-04 DIAGNOSIS — D259 Leiomyoma of uterus, unspecified: Secondary | ICD-10-CM

## 2013-02-04 DIAGNOSIS — D251 Intramural leiomyoma of uterus: Secondary | ICD-10-CM

## 2013-02-04 DIAGNOSIS — N838 Other noninflammatory disorders of ovary, fallopian tube and broad ligament: Secondary | ICD-10-CM

## 2013-02-04 DIAGNOSIS — N938 Other specified abnormal uterine and vaginal bleeding: Secondary | ICD-10-CM

## 2013-02-04 DIAGNOSIS — D3910 Neoplasm of uncertain behavior of unspecified ovary: Secondary | ICD-10-CM

## 2013-02-04 DIAGNOSIS — D3911 Neoplasm of uncertain behavior of right ovary: Secondary | ICD-10-CM

## 2013-02-04 DIAGNOSIS — N949 Unspecified condition associated with female genital organs and menstrual cycle: Secondary | ICD-10-CM

## 2013-02-04 DIAGNOSIS — Z8742 Personal history of other diseases of the female genital tract: Secondary | ICD-10-CM

## 2013-02-04 DIAGNOSIS — N83202 Unspecified ovarian cyst, left side: Secondary | ICD-10-CM

## 2013-02-04 DIAGNOSIS — N854 Malposition of uterus: Secondary | ICD-10-CM

## 2013-02-04 MED ORDER — MEDROXYPROGESTERONE ACETATE 150 MG/ML IM SUSP: 150.0000 mg | Freq: Once | INTRAMUSCULAR | Status: DC

## 2013-02-04 MED ORDER — MEDROXYPROGESTERONE ACETATE 150 MG/ML IM SUSP
150.0000 mg | Freq: Once | INTRAMUSCULAR | Status: AC
  Administered 2013-02-04: 150 mg via INTRAMUSCULAR

## 2013-02-04 NOTE — Patient Instructions (Signed)
CA-125 Tumor Marker CA 125 is a tumor marker that is used to help monitor the course of ovarian or endometrial cancer. PREPARATION FOR TEST No preparation is necessary. NORMAL FINDINGS Adults: 0-35 units/mL (0-35 kilounits)/L Ranges for normal findings may vary among different laboratories and hospitals. You should always check with your doctor after having lab work or other tests done to discuss the meaning of your test results and whether your values are considered within normal limits. MEANING OF TEST  Your caregiver will go over the test results with you and discuss the importance and meaning of your results, as well as treatment options and the need for additional tests if necessary. OBTAINING THE TEST RESULTS It is your responsibility to obtain your test results. Ask the lab or department performing the test when and how you will get your results. Document Released: 12/04/2004 Document Revised: 02/04/2012 Document Reviewed: 10/20/2008 [facility] Patient Information 2013 [place], [place].  Ovarian Cyst The ovaries are small organs that are on each side of the uterus. The ovaries are the organs that produce the female hormones, estrogen and progesterone. An ovarian cyst is a sac filled with fluid that can vary in its size. It is normal for a small cyst to form in women who are in the childbearing age and who have menstrual periods. This type of cyst is called a follicle cyst that becomes an ovulation cyst (corpus luteum cyst) after it produces the women's egg. It later goes away on its own if the woman does not become pregnant. There are other kinds of ovarian cysts that may cause problems and may need to be treated. The most serious problem is a cyst with cancer. It should be noted that menopausal women who have an ovarian cyst are at a higher risk of it being a cancer cyst. They should be evaluated very quickly, thoroughly and followed closely. This is especially true in menopausal women because of  the high rate of ovarian cancer in women in menopause. CAUSES AND TYPES OF OVARIAN CYSTS:  FUNCTIONAL CYST: The follicle/corpus luteum cyst is a functional cyst that occurs every month during ovulation with the menstrual cycle. They go away with the next menstrual cycle if the woman does not get pregnant. Usually, there are no symptoms with a functional cyst.  ENDOMETRIOMA CYST: This cyst develops from the lining of the uterus tissue. This cyst gets in or on the ovary. It grows every month from the bleeding during the menstrual period. It is also called a "chocolate cyst" because it becomes filled with blood that turns brown. This cyst can cause pain in the lower abdomen during intercourse and with your menstrual period.  CYSTADENOMA CYST: This cyst develops from the cells on the outside of the ovary. They usually are not cancerous. They can get very big and cause lower abdomen pain and pain with intercourse. This type of cyst can twist on itself, cut off its blood supply and cause severe pain. It also can easily rupture and cause a lot of pain.  DERMOID CYST: This type of cyst is sometimes found in both ovaries. They are found to have different kinds of body tissue in the cyst. The tissue includes skin, teeth, hair, and/or cartilage. They usually do not have symptoms unless they get very big. Dermoid cysts are rarely cancerous.  POLYCYSTIC OVARY: This is a rare condition with hormone problems that produces many small cysts on both ovaries. The cysts are follicle-like cysts that never produce an egg and become a  corpus luteum. It can cause an increase in body weight, infertility, acne, increase in body and facial hair and lack of menstrual periods or rare menstrual periods. Many women with this problem develop type 2 diabetes. The exact cause of this problem is unknown. A polycystic ovary is rarely cancerous.  THECA LUTEIN CYST: Occurs when too much hormone (human chorionic gonadotropin) is produced and  over-stimulates the ovaries to produce an egg. They are frequently seen when doctors stimulate the ovaries for invitro-fertilization (test tube babies).  LUTEOMA CYST: This cyst is seen during pregnancy. Rarely it can cause an obstruction to the birth canal during labor and delivery. They usually go away after delivery. SYMPTOMS   Pelvic pain or pressure.  Pain during sexual intercourse.  Increasing girth (swelling) of the abdomen.  Abnormal menstrual periods.  Increasing pain with menstrual periods.  You stop having menstrual periods and you are not pregnant. DIAGNOSIS  The diagnosis can be made during:  Routine or annual pelvic examination (common).  Ultrasound.  X-ray of the pelvis.  CT Scan.  MRI.  Blood tests. TREATMENT   Treatment may only be to follow the cyst monthly for 2 to 3 months with your caregiver. Many go away on their own, especially functional cysts.  May be aspirated (drained) with a long needle with ultrasound, or by laparoscopy (inserting a tube into the pelvis through a small incision).  The whole cyst can be removed by laparoscopy.  Sometimes the cyst may need to be removed through an incision in the lower abdomen.  Hormone treatment is sometimes used to help dissolve certain cysts.  Birth control pills are sometimes used to help dissolve certain cysts. HOME CARE INSTRUCTIONS  Follow your caregiver's advice regarding:  Medicine.  Follow up visits to evaluate and treat the cyst.  You may need to come back or make an appointment with another caregiver, to find the exact cause of your cyst, if your caregiver is not a gynecologist.  Get your yearly and recommended pelvic examinations and Pap tests.  Let your caregiver know if you have had an ovarian cyst in the past. SEEK MEDICAL CARE IF:   Your periods are late, irregular, they stop, or are painful.  Your stomach (abdomen) or pelvic pain does not go away.  Your stomach becomes larger or  swollen.  You have pressure on your bladder or trouble emptying your bladder completely.  You have painful sexual intercourse.  You have feelings of fullness, pressure, or discomfort in your stomach.  You lose weight for no apparent reason.  You feel generally ill.  You become constipated.  You lose your appetite.  You develop acne.  You have an increase in body and facial hair.  You are gaining weight, without changing your exercise and eating habits.  You think you are pregnant. SEEK IMMEDIATE MEDICAL CARE IF:   You have increasing abdominal pain.  You feel sick to your stomach (nausea) and/or vomit.  You develop a fever that comes on suddenly.  You develop abdominal pain during a bowel movement.  Your menstrual periods become heavier than usual. Document Released: 11/12/2005 Document Revised: 02/04/2012 Document Reviewed: 09/15/2009 [facility] Patient Information 2013 [place], [place].

## 2013-02-04 NOTE — Progress Notes (Signed)
Patient presented to the office today to continue her evaluation for her recent dysfunction uterine bleeding. Patient was seen in the office on February 28 for her annual exam when she voiced this concern. She underwent an endometrial biopsy with the following results:  Endometrium, biopsy, uterus - PROLIFERATIVE PATTERN ENDOMETRIUM. - NO HYPERPLASIA, ATYPIA OR MALIGNANCY IDENTIFIED.  Last Pap smear normal 2012  Patient with elevated FSH 32.6 on April 2013 and on followup on March 28 was 5.8. Dr. Lurene Shadow has been monitoring her thyroid disorder and patient had her recent thyroid function test with her and she is still on the same thyroid medication dosage. Patient is not having any vasomotor symptoms.  Ultrasound today: Uterus measured 8.9 x 7.3 x 6.3 cm with endometrial stripe of 6.5 mm. Patient with several small intramural myomas totaling 4 with her largest one measuring 23 x 16 mm. Patient with prior history of right salpingo-oophorectomy for severe endometriosis. Left ovary with thinwall echo-free avascular cyst measuring 56 x 53 x 56 mm. There was no fluid in the cul-de-sac.  Patient underwent a sonohysterogram: The cervix was cleansed with Betadine solution and a sterile intrauterine catheter was introduced into the intrauterine cavity. Fluid was instilled and no intrauterine defect was noted.  Assessment/plan: Patient with left ovarian cyst probably the source of estrogen production contributing to her FSH now decreased from previous study. We will obtain a CA 125 today. The limitations of the results were discussed with this patient. She knows this and she has had history of endometriosis and fibroids that her CA 125 may be falsely elevated. She will receive Depo-Provera 150 mg IM today and she will return back to the office in 3 months for followup ultrasound. If the cyst is still persistent are giving her problem regardless of the CA 125 I had recommended that since she's now approaching 57  that we proceed with a laparoscopic right salpingo-oophorectomy. We will reassess in 3 months.

## 2013-02-05 LAB — CA 125: CA 125: 24.4 U/mL (ref 0.0–30.2)

## 2013-04-08 ENCOUNTER — Other Ambulatory Visit: Payer: Self-pay | Admitting: Cardiovascular Disease

## 2013-05-08 ENCOUNTER — Ambulatory Visit (INDEPENDENT_AMBULATORY_CARE_PROVIDER_SITE_OTHER): Payer: BC Managed Care – PPO

## 2013-05-08 ENCOUNTER — Ambulatory Visit (INDEPENDENT_AMBULATORY_CARE_PROVIDER_SITE_OTHER): Payer: BC Managed Care – PPO | Admitting: Gynecology

## 2013-05-08 ENCOUNTER — Encounter: Payer: Self-pay | Admitting: Gynecology

## 2013-05-08 VITALS — BP 110/70

## 2013-05-08 DIAGNOSIS — N83202 Unspecified ovarian cyst, left side: Secondary | ICD-10-CM

## 2013-05-08 DIAGNOSIS — N949 Unspecified condition associated with female genital organs and menstrual cycle: Secondary | ICD-10-CM

## 2013-05-08 DIAGNOSIS — N951 Menopausal and female climacteric states: Secondary | ICD-10-CM

## 2013-05-08 DIAGNOSIS — N938 Other specified abnormal uterine and vaginal bleeding: Secondary | ICD-10-CM

## 2013-05-08 DIAGNOSIS — N83209 Unspecified ovarian cyst, unspecified side: Secondary | ICD-10-CM

## 2013-05-08 MED ORDER — MEGESTROL ACETATE 40 MG PO TABS: ORAL_TABLET | ORAL | Status: DC

## 2013-05-08 NOTE — Patient Instructions (Addendum)
Total Laparoscopic Hysterectomy A total laparoscopic hysterectomy is a minimally invasive surgery to remove your uterus and cervix. This surgery is performed by making several small cuts (incisions) in your abdomen. It can also be done with a thin, lighted tube (laparoscope) inserted into 2 small incisions in the lower abdomen. Your fallopian tubes and ovaries can be removed (bilateral salpingo-oopherectomy) during this surgery as well.If a total laparoscopic hysterectomy is started and it is not safe to continue, the laparoscopic surgery will be converted to an open abdominal surgery. You will not have menstrual periods or be able to get pregnant after having this surgery. If a bilateral salpingo-oopherectomy was performed before menopause, you will go through a sudden (abrupt) menopause. This can be helped with hormone medicines. Benefits of minimally invasive surgery include:  Less pain.  Less risk of blood loss.  Less risk of infection.  Quicker return to normal activities.  Usually a 1 night stay in the hospital.  Overall patient satisfaction. LET YOUR CAREGIVER KNOW ABOUT:  Any history of abnormal Pap tests.  Allergies to food or medicine.  Medicines taken, including vitamins, herbs, eyedrops, over-the-counter medicines, and creams.  Use of steroids (by mouth or creams).  Previous problems with anesthetics or numbing medicines.  History of bleeding problems or blood clots.  Previous surgery.  Other health problems, including diabetes and kidney problems.  Desire for future fertility.  Any infections or colds you may have developed.  Symptoms of irregular or heavy periods, weight loss, or urinary or bowel changes. RISKS AND COMPLICATIONS   Bleeding.  Blood clots in the legs or lung.  Infection.  Injury to surrounding organs.  Problems with anesthesia.  Early menopause symptoms (hot flashes, night sweats, insomnia).  Risk of conversion to an open abdominal  incision. BEFORE THE PROCEDURE  Ask your caregiver about changing or stopping your regular medicines.  Do not take aspirin or blood thinners (anticoagulants) for 1 week before the surgery, or as told by your caregiver.  Do not eat or drink anything for 8 hours before the surgery, or as told by your caregiver.  Quit smoking if you smoke.  Arrange for a ride home after surgery and for someone to help you at home during recovery. PROCEDURE   You will be given antibiotic medicine.  An intravenous (IV) line will be placed in your arm. You will be given medicine to make you sleep (general anesthetic).  A gas (carbon dioxide) will be used to inflate your abdomen. This will allow your surgeon to look inside your abdomen, perform your surgery, and treat any other problems found if necessary.  Three or four small incisions (often less than  inch) will be made in your abdomen. One of these incisions will be made in the area of your belly button (navel). The laparoscope will be inserted into the incision. Your surgeon will look through the laparoscope while doing your procedure.  Other surgical instruments will be inserted through the other incisions.  The uterus may be removed through the vagina or cut into small pieces and removed through the small incisions.  Your incisions will be closed. AFTER THE PROCEDURE  The gas will be released from inside your abdomen.  You will be taken to the recovery area where a nurse will watch and check your progress. Once you are awake, stable, and taking fluids well, without other problems, you will return to your room or be allowed to go home.  There is usually minimal discomfort following the surgery because  the incisions are so small.  You will be given pain medicine while you are in the hospital and for when you go home.  Try to have someone with you the first 3 to 5 days after you go home.  Follow up with your surgeon in 2 to 4 weeks after surgery  to evaluate your progress. Document Released: 09/09/2007 Document Revised: 02/04/2012 Document Reviewed: 06/29/2011 [facility] Patient Information 2014 [place], [place].

## 2013-05-08 NOTE — Progress Notes (Signed)
Patient is a 57 year old who presented to the office for further discussion of her dysfunctional uterine bleeding.Patient was seen in the office on February 28 for her annual exam when she voiced this concern. She underwent an endometrial biopsy with the following results:   Endometrium, biopsy, uterus  - PROLIFERATIVE PATTERN ENDOMETRIUM.  - NO HYPERPLASIA, ATYPIA OR MALIGNANCY IDENTIFIED.   Last Pap smear normal 2012  Patient with elevated FSH 32.6 on April 2013 and on followup on March 28 was 5.8. Dr. Lurene Shadow has been monitoring her thyroid disorder and patient had her recent thyroid function test with her and she is still on the same thyroid medication dosage. Patient is not having any vasomotor symptoms.  On office visit 02/04/2013 she had an ultrasound with the following results:  Uterus measured 8.9 x 7.3 x 6.3 cm with endometrial stripe of 6.5 mm. Patient with several small intramural myomas totaling 4 with her largest one measuring 23 x 16 mm. Patient with prior history of right salpingo-oophorectomy for severe endometriosis. Left ovary with thinwall echo-free avascular cyst measuring 56 x 53 x 56 mm. There was no fluid in the cul-de-sac.  On that same office visit patient underwent a sonohysterogram but no intracavitary defects noted.  Patient had a normal CA 125, screen cholesterol, an FSH of 5.8. Her hemoglobin was found to be 11.5 for which she started to take our tablet daily. She had received Depo-Provera 150 mg IM and returned  to the office today for followup ultrasound.  Ultrasound: Uterus measuring 9.2 x 6.3 x 5.7 mm with endometrial stripe of 5.3 mm. Patient was sooner small intramural myomas. Previous ovarian cyst completely resolved. No fluid in the cul-de-sac.  The patient states that she did continue to bleed does not to spotting.  She's wearing off the effects of the Depo-Provera as well as the resolution of this ovarian cyst. We're going to place her on Megace 40 mg  twice a day for 12 days of each month for the next 3 months and then she will monitor her cycles if she continues with any form of irregular bleeding we have discussed several options want to include her option endometrial ablation or total laparoscopic hysterectomy with ovarian conservation. Literature and information was provided.

## 2013-07-28 ENCOUNTER — Other Ambulatory Visit: Payer: Self-pay | Admitting: *Deleted

## 2013-07-28 MED ORDER — METOPROLOL SUCCINATE ER 25 MG PO TB24: 25.0000 mg | ORAL_TABLET | Freq: Every day | ORAL | Status: DC

## 2013-07-28 NOTE — Telephone Encounter (Signed)
Rx was sent to pharmacy electronically. 

## 2013-08-20 ENCOUNTER — Encounter: Payer: Self-pay | Admitting: *Deleted

## 2013-08-21 ENCOUNTER — Encounter: Payer: Self-pay | Admitting: Cardiovascular Disease

## 2013-08-21 ENCOUNTER — Ambulatory Visit (INDEPENDENT_AMBULATORY_CARE_PROVIDER_SITE_OTHER): Payer: BC Managed Care – PPO | Admitting: Cardiovascular Disease

## 2013-08-21 VITALS — BP 100/60 | HR 67 | Ht 64.0 in | Wt 145.0 lb

## 2013-08-21 DIAGNOSIS — R002 Palpitations: Secondary | ICD-10-CM

## 2013-08-21 MED ORDER — METOPROLOL SUCCINATE ER 50 MG PO TB24: 50.0000 mg | ORAL_TABLET | Freq: Every day | ORAL | Status: DC

## 2013-08-21 NOTE — Assessment & Plan Note (Signed)
I saw [name] back 12/09/12. She has a history of palpitations with an event monitor that showed PVCs as well as episodes of sinus tachycardia versus PSVT. I did begin her on low-dose beta blockade with which resulted in marked improvement in her symptoms. She really spent 10 days in [place] and most there she noticed increasing frequency and severity of her palpitations.she did have a normal 2-D echo and Myoview stress test. We've had discussion about whether to perform an additional amount her but decided to empirically increase her beta blocker to double the dose.

## 2013-08-21 NOTE — Patient Instructions (Addendum)
Your physician wants you to follow-up in: 3 months with an extender and 6 months with Dr Allyson Sabal.  You will receive a reminder letter in the mail two months in advance. If you don't receive a letter, please call our office to schedule the follow-up appointment.  Dr Allyson Sabal has increased your Metoprolol succinate to 50mg  daily.

## 2013-08-21 NOTE — Progress Notes (Signed)
08/21/2013 [place] [name]   1956/08/10  [id number]  Primary Physician Ok Edwards, MD Primary Cardiologist: Runell Gess MD [name]   HPI:  The patient returns today for followup. She is a 57 year old thin and fit-appearing married Caucasian female, mother of 2, who works as an Network engineer. I had seen her initially 4 years ago, and she was referred back in September for chest pain and palpitations. She is on Adderall and has stopped drinking caffeine. She has had thyroid ablation and is having thyroid replacement, managed by Dr. Talmage Nap. A Myoview stress test was normal. An event monitor just showed sinus tachycardia versus PSVT. I did begin her on low-dose beta blockade, which resulted in marked improvement in her clinical symptoms. Since I saw her back on 12/09/12 she said recent increase in frequency and severity of her palpitation symptoms.    Current Outpatient Prescriptions  Medication Sig Dispense Refill  . acyclovir cream (ZOVIRAX) 5 %       . Amphetamine-Dextroamphetamine (ADDERALL PO) Take 15 mg by mouth daily.       . cetirizine (ZYRTEC) 10 MG tablet Take 10 mg by mouth daily.      . fish oil-omega-3 fatty acids 1000 MG capsule Take 2 g by mouth daily.        . Ginger, Zingiber officinalis, (GINGER PO) Take by mouth.        . levothyroxine (SYNTHROID, LEVOTHROID) 100 MCG tablet Take 100 mcg by mouth daily.      . metoprolol succinate (TOPROL-XL) 25 MG 24 hr tablet Take 1 tablet (25 mg total) by mouth daily.  30 tablet  5  . Misc Natural Products (TART CHERRY ADVANCED PO) Take by mouth.        . Sertraline HCl (ZOLOFT PO) Take 75 mg by mouth daily.       . valACYclovir (VALTREX) 1000 MG tablet        No current facility-administered medications for this visit.    No Known Allergies  History   Social History  . Marital Status: Married    Spouse Name: N/A    Number of Children: N/A  . Years of Education: N/A   Occupational History  . Not  on file.   Social History Main Topics  . Smoking status: Never Smoker   . Smokeless tobacco: Never Used  . Alcohol Use: Yes     Comment: occ, two glasses of wine a week  . Drug Use: No  . Sexual Activity: Yes    Birth Control/ Protection: Pill   Other Topics Concern  . Not on file   Social History Narrative  . No narrative on file     Review of Systems: General: negative for chills, fever, night sweats or weight changes.  Cardiovascular: negative for chest pain, dyspnea on exertion, edema, orthopnea, palpitations, paroxysmal nocturnal dyspnea or shortness of breath Dermatological: negative for rash Respiratory: negative for cough or wheezing Urologic: negative for hematuria Abdominal: negative for nausea, vomiting, diarrhea, bright red blood per rectum, melena, or hematemesis Neurologic: negative for visual changes, syncope, or dizziness All other systems reviewed and are otherwise negative except as noted above.    Blood pressure 100/60, pulse 67, height 5\' 4"  (1.626 m), weight 145 lb (65.772 kg).  General appearance: alert and no distress Neck: no adenopathy, no carotid bruit, no JVD, supple, symmetrical, trachea midline and thyroid not enlarged, symmetric, no tenderness/mass/nodules Lungs: clear to auscultation bilaterally Heart: regular rate and rhythm, S1, S2 normal, no  murmur, click, rub or gallop Extremities: extremities normal, atraumatic, no cyanosis or edema  EKG normal sinus rhythm at 57 without ST or T wave changes  ASSESSMENT AND PLAN:   Palpitations I saw Ms. Callahan back 12/09/12. She has a history of palpitations with an event monitor that showed PVCs as well as episodes of sinus tachycardia versus PSVT. I did begin her on low-dose beta blockade with which resulted in marked improvement in her symptoms. She really spent 10 days in [place] and most there she noticed increasing frequency and severity of her palpitations.she did have a normal 2-D echo and  Myoview stress test. We've had discussion about whether to perform an additional amount her but decided to empirically increase her beta blocker to double the dose.      Runell Gess MD FACP,FACC,FAHA, [facility] 08/21/2013 10:38 AM

## 2014-02-01 ENCOUNTER — Encounter: Payer: Self-pay | Admitting: Gynecology

## 2014-02-18 ENCOUNTER — Ambulatory Visit (INDEPENDENT_AMBULATORY_CARE_PROVIDER_SITE_OTHER): Payer: BC Managed Care – PPO | Admitting: Gynecology

## 2014-02-18 ENCOUNTER — Other Ambulatory Visit (HOSPITAL_COMMUNITY)
Admission: RE | Admit: 2014-02-18 | Discharge: 2014-02-18 | Disposition: A | Discharge status: Home / Self Care | Payer: BC Managed Care – PPO | Source: Ambulatory Visit | Attending: Gynecology | Admitting: Gynecology

## 2014-02-18 ENCOUNTER — Encounter: Payer: Self-pay | Admitting: Gynecology

## 2014-02-18 VITALS — BP 100/66 | Ht 64.0 in | Wt 136.2 lb

## 2014-02-18 DIAGNOSIS — Z124 Encounter for screening for malignant neoplasm of cervix: Secondary | ICD-10-CM | POA: Insufficient documentation

## 2014-02-18 DIAGNOSIS — Z1159 Encounter for screening for other viral diseases: Secondary | ICD-10-CM

## 2014-02-18 DIAGNOSIS — Z01419 Encounter for gynecological examination (general) (routine) without abnormal findings: Secondary | ICD-10-CM

## 2014-02-18 DIAGNOSIS — Z1151 Encounter for screening for human papillomavirus (HPV): Secondary | ICD-10-CM | POA: Insufficient documentation

## 2014-02-18 DIAGNOSIS — N951 Menopausal and female climacteric states: Secondary | ICD-10-CM | POA: Insufficient documentation

## 2014-02-18 NOTE — Progress Notes (Addendum)
[name] November 20, 1956 [id number]   History:    58 y.o.  for annual gyn exam with no complaints today. Patient has been perimenopausal. Review of her record indicated that in April 2013 her [facility] was 32.6 and when it was repeated in February 2014 it was 5.8. Patient is having no vasomotor symptoms. She had gone through several months without a period and had one recently.  Patient has been followed by her endocrinologist Dr. Bubba Camp for her nontoxic multinodular goiter.patient informed me that in August of 2013 she had iodine 131 treatment for her thyroid. She is currently on levothyroid 100 mcg daily. Patient prior to that has had a subtotal thyroidectomy in the past.  Patient has been followed by Dr. Quay Burow cardiologist for some form of mild cardiac arrhythmia for which she's currently on Toprol.  Review of her record indicated also that she has had laparoscopic right salpingo-oophorectomy in the past and has had a history of stage IV endometriosis. She also suffers from HSV but infrequently. Patient with no prior history of abnormal Pap smears.She had a Mirena IUD removed in 2013. Last bone density study was normal 2012.    Normal colonoscopy 2012.   Past medical history,surgical history, family history and social history were all reviewed and documented in the EPIC chart.  Gynecologic History Patient's last menstrual period was 02/07/2014. Contraception: none Last Pap: 2012. Results were: normal Last mammogram: 2015. Results were: Normal 3-D dense breast  Obstetric History OB History  Gravida Para Term Preterm AB SAB TAB Ectopic Multiple Living  2 2 2       2     # Outcome Date GA Lbr Len/2nd Weight Sex Delivery Anes PTL Lv  2 TRM     M SVD  N Y  1 TRM     F SVD  N Y       ROS: A ROS was performed and pertinent positives and negatives are included in the history.  GENERAL: No fevers or chills. HEENT: No change in vision, no earache, sore throat or sinus congestion.  NECK: No pain or stiffness. CARDIOVASCULAR: No chest pain or pressure. No palpitations. PULMONARY: No shortness of breath, cough or wheeze. GASTROINTESTINAL: No abdominal pain, nausea, vomiting or diarrhea, melena or bright red blood per rectum. GENITOURINARY: No urinary frequency, urgency, hesitancy or dysuria. MUSCULOSKELETAL: No joint or muscle pain, no back pain, no recent trauma. DERMATOLOGIC: No rash, no itching, no lesions. ENDOCRINE: No polyuria, polydipsia, no heat or cold intolerance. No recent change in weight. HEMATOLOGICAL: No anemia or easy bruising or bleeding. NEUROLOGIC: No headache, seizures, numbness, tingling or weakness. PSYCHIATRIC: No depression, no loss of interest in normal activity or change in sleep pattern.     Exam: chaperone present  BP 100/66  Ht 5\' 4"  (1.626 m)  Wt 136 lb 3.2 oz (61.78 kg)  BMI 23.37 kg/m2  LMP 02/07/2014  Body mass index is 23.37 kg/(m^2).  General appearance : Well developed well nourished female. No acute distress HEENT: Neck supple, trachea midline, no carotid bruits, no thyroidmegaly Lungs: Clear to auscultation, no rhonchi or wheezes, or rib retractions  Heart: Regular rate and rhythm, no murmurs or gallops Breast:Examined in sitting and supine position were symmetrical in appearance, no palpable masses or tenderness,  no skin retraction, no nipple inversion, no nipple discharge, no skin discoloration, no axillary or supraclavicular lymphadenopathy Abdomen: no palpable masses or tenderness, no rebound or guarding Extremities: no edema or skin discoloration or tenderness  Pelvic:  Bartholin, Urethra, Skene Glands: Within normal limits             Vagina: No gross lesions or discharge  Cervix: No gross lesions or discharge  Uterus  anteverted, normal size, shape and consistency, non-tender and mobile  Adnexa  Without masses or tenderness  Anus and perineum  normal   Rectovaginal  normal sphincter tone without palpated masses or  tenderness             Hemoccult card provided     Assessment/Plan:  58 y.o. female for annual exam perimenopausal with no vasomotor symptoms fluctuating FSH previous 2 years. Will check [place] along with fasting lipid profile, conference metabolic panel, thyroid function tests, CBC, as well as urinalysis tomorrow.  New CDC guidelines is recommending patients be tested once in her lifetime for hepatitis C antibody who were born between [birth year] through 1963. This was discussed with the patient today and has agreed to be tested today.  Patient will schedule for a bone density study in the next 2 weeks. We discussed importance of calcium and vitamin D and regular exercise for osteoporosis prevention. Pap smear was done today in accordance to the new guidelines. She was reminded to do her monthly breast exam. She was f provided with Hemoccult cards for testing in the near future. She is to follow up with endocrinologist later this summer. Patient had a normal colonoscopy in 2012.  Note: This dictation was prepared with  Dragon/digital dictation along withSmart phrase technology. Any transcriptional errors that result from this process are unintentional.   Terrance Mass MD, 11:51 AM 02/18/2014

## 2014-02-18 NOTE — Patient Instructions (Addendum)
Breast Self-Awareness Practicing breast self-awareness may pick up problems early, prevent significant medical complications, and possibly save your life. By practicing breast self-awareness, you can become familiar with how your breasts look and feel and if your breasts are changing. This allows you to notice changes early. It can also offer you some reassurance that your breast health is good. One way to learn what is normal for your breasts and whether your breasts are changing is to do a breast self-exam. If you find a lump or something that was not present in the past, it is best to contact your caregiver right away. Other findings that should be evaluated by your caregiver include nipple discharge, especially if it is bloody; skin changes or reddening; areas where the skin seems to be pulled in (retracted); or new lumps and bumps. Breast pain is seldom associated with cancer (malignancy), but should also be evaluated by a caregiver. HOW TO PERFORM A BREAST SELF-EXAM The best time to examine your breasts is 5 7 days after your menstrual period is over. During menstruation, the breasts are lumpier, and it may be more difficult to pick up changes. If you do not menstruate, have reached menopause, or had your uterus removed (hysterectomy), you should examine your breasts at regular intervals, such as monthly. If you are breastfeeding, examine your breasts after a feeding or after using a breast pump. Breast implants do not decrease the risk for lumps or tumors, so continue to perform breast self-exams as recommended. Talk to your caregiver about how to determine the difference between the implant and breast tissue. Also, talk about the amount of pressure you should use during the exam. Over time, you will become more familiar with the variations of your breasts and more comfortable with the exam. A breast self-exam requires you to remove all your clothes above the waist.  Shingles Vaccine What You Need to  Know WHAT IS SHINGLES?  Shingles is a painful skin rash, often with blisters. It is also called Herpes Zoster or just Zoster.  A shingles rash usually appears on one side of the face or body and lasts from 2 to 4 weeks. Its main symptom is pain, which can be quite severe. Other symptoms of shingles can include fever, headache, chills, and upset stomach. Very rarely, a shingles infection can lead to pneumonia, hearing problems, blindness, brain inflammation (encephalitis), or death.  For about 1 person in 5, severe pain can continue even after the rash clears up. This is called post-herpetic neuralgia.  Shingles is caused by the Varicella Zoster virus. This is the same virus that causes chickenpox. Only someone who has had a case of chickenpox or rarely, has gotten chickenpox vaccine, can get shingles. The virus stays in your body. It can reappear many years later to cause a case of shingles.  You cannot catch shingles from another person with shingles. However, a person who has never had chickenpox (or chickenpox vaccine) could get chickenpox from someone with shingles. This is not very common.  Shingles is far more common in people 77 and older than in younger people. It is also more common in people whose immune systems are weakened because of a disease such as cancer or drugs such as steroids or chemotherapy.  At least 1 million people get shingles per year in the [place]. SHINGLES VACCINE  A vaccine for shingles was licensed in 3500. In clinical trials, the vaccine reduced the risk of shingles by 50%. It can also reduce the pain  in people who still get shingles after being vaccinated.  A single dose of shingles vaccine is recommended for adults 32 years of age and older. SOME PEOPLE SHOULD NOT GET SHINGLES VACCINE OR SHOULD WAIT A person should not get shingles vaccine if he or she:  Has ever had a life-threatening allergic reaction to gelatin, the antibiotic neomycin, or any other  component of shingles vaccine. Tell your caregiver if you have any severe allergies.  Has a weakened immune system because of current:  AIDS or another disease that affects the immune system.  Treatment with drugs that affect the immune system, such as prolonged use of high-dose steroids.  Cancer treatment, such as radiation or chemotherapy.  Cancer affecting the bone marrow or lymphatic system, such as leukemia or lymphoma.  Is pregnant, or might be pregnant. Women should not become pregnant until at least 4 weeks after getting shingles vaccine. Someone with a minor illness, such as a cold, may be vaccinated. Anyone with a moderate or severe acute illness should usually wait until he or she recovers before getting the vaccine. This includes anyone with a temperature of 101.3 F (38 C) or higher. WHAT ARE THE RISKS FROM SHINGLES VACCINE?  A vaccine, like any medicine, could possibly cause serious problems, such as severe allergic reactions. However, the risk of a vaccine causing serious harm, or death, is extremely small.  No serious problems have been identified with shingles vaccine. Mild Problems  Redness, soreness, swelling, or itching at the site of the injection (about 1 person in 3).  Headache (about 1 person in 47). Like all vaccines, shingles vaccine is being closely monitored for unusual or severe problems. WHAT IF THERE IS A MODERATE OR SEVERE REACTION? What should I look for? Any unusual condition, such as a severe allergic reaction or a high fever. If a severe allergic reaction occurred, it would be within a few minutes to an hour after the shot. Signs of a serious allergic reaction can include difficulty breathing, weakness, hoarseness or wheezing, a fast heartbeat, hives, dizziness, paleness, or swelling of the throat. What should I do?  Call your caregiver, or get the person to a caregiver right away.  Tell the caregiver what happened, the date and time it happened,  and when the vaccination was given.  Ask the caregiver to report the reaction by filing a Vaccine Adverse Event Reporting System (VAERS) form. Or, you can file this report through the VAERS web site at www.vaers.SamedayNews.es or by calling 641-273-5276. VAERS does not provide medical advice. HOW CAN I LEARN MORE?  Ask your caregiver. He or she can give you the vaccine package insert or suggest other sources of information.  Contact the Centers for Disease Control and Prevention (CDC):  Call (680) 607-1323 (1-800-CDC-INFO).  Visit the CDC website at http://hunter.com/ CDC Shingles Vaccine VIS (08/31/08) Document Released: 09/09/2006 Document Revised: 02/04/2012 Document Reviewed: 03/04/2013 [employer] Patient Information 2014 [place]. 1. Look at your breasts and nipples. Stand in front of a mirror in a room with good lighting. With your hands on your hips, push your hands firmly downward. Look for a difference in shape, contour, and size from one breast to the other (asymmetry). Asymmetry includes puckers, dips, or bumps. Also, look for skin changes, such as reddened or scaly areas on the breasts. Look for nipple changes, such as discharge, dimpling, repositioning, or redness. 2. Carefully feel your breasts. This is best done either in the shower or tub while using soapy water or when flat  on your back. Place the arm (on the side of the breast you are examining) above your head. Use the pads (not the fingertips) of your three middle fingers on your opposite hand to feel your breasts. Start in the underarm area and use  inch (2 cm) overlapping circles to feel your breast. Use 3 different levels of pressure (light, medium, and firm pressure) at each circle before moving to the next circle. The light pressure is needed to feel the tissue closest to the skin. The medium pressure will help to feel breast tissue a little deeper, while the firm pressure is needed to feel the tissue close to the ribs.  Continue the overlapping circles, moving downward over the breast until you feel your ribs below your breast. Then, move one finger-width towards the center of the body. Continue to use the  inch (2 cm) overlapping circles to feel your breast as you move slowly up toward the collar bone (clavicle) near the base of the neck. Continue the up and down exam using all 3 pressures until you reach the middle of the chest. Do this with each breast, carefully feeling for lumps or changes. 3.  Keep a written record with breast changes or normal findings for each breast. By writing this information down, you do not need to depend only on memory for size, tenderness, or location. Write down where you are in your menstrual cycle, if you are still menstruating. Breast tissue can have some lumps or thick tissue. However, see your caregiver if you find anything that concerns you.  SEEK MEDICAL CARE IF:  You see a change in shape, contour, or size of your breasts or nipples.   You see skin changes, such as reddened or scaly areas on the breasts or nipples.   You have an unusual discharge from your nipples.   You feel a new lump or unusually thick areas.  Document Released: 11/12/2005 Document Revised: 10/29/2012 Document Reviewed: 02/27/2012 [facility] Patient Information 2014 [place].

## 2014-02-19 ENCOUNTER — Other Ambulatory Visit: Payer: BC Managed Care – PPO

## 2014-02-19 LAB — URINALYSIS W MICROSCOPIC + REFLEX CULTURE
Bilirubin Urine: NEGATIVE
Casts: NONE SEEN
Crystals: NONE SEEN
Glucose, UA: NEGATIVE mg/dL
Hgb urine dipstick: NEGATIVE
Ketones, ur: NEGATIVE mg/dL
LEUKOCYTES UA: NEGATIVE
Nitrite: NEGATIVE
PROTEIN: NEGATIVE mg/dL
Specific Gravity, Urine: 1.018 (ref 1.005–1.030)
UROBILINOGEN UA: 0.2 mg/dL (ref 0.0–1.0)
pH: 6.5 (ref 5.0–8.0)

## 2014-02-19 LAB — CBC WITH DIFFERENTIAL/PLATELET
BASOS ABS: 0 10*3/uL (ref 0.0–0.1)
Basophils Relative: 1 % (ref 0–1)
Eosinophils Absolute: 0.1 10*3/uL (ref 0.0–0.7)
Eosinophils Relative: 2 % (ref 0–5)
HEMATOCRIT: 32.8 % — AB (ref 36.0–46.0)
Hemoglobin: 10.9 g/dL — ABNORMAL LOW (ref 12.0–15.0)
LYMPHS ABS: 1.5 10*3/uL (ref 0.7–4.0)
LYMPHS PCT: 34 % (ref 12–46)
MCH: 28.5 pg (ref 26.0–34.0)
MCHC: 33.2 g/dL (ref 30.0–36.0)
MCV: 85.9 fL (ref 78.0–100.0)
Monocytes Absolute: 0.3 10*3/uL (ref 0.1–1.0)
Monocytes Relative: 8 % (ref 3–12)
NEUTROS ABS: 2.4 10*3/uL (ref 1.7–7.7)
Neutrophils Relative %: 55 % (ref 43–77)
PLATELETS: 291 10*3/uL (ref 150–400)
RBC: 3.82 MIL/uL — ABNORMAL LOW (ref 3.87–5.11)
RDW: 14.3 % (ref 11.5–15.5)
WBC: 4.3 10*3/uL (ref 4.0–10.5)

## 2014-02-19 LAB — LIPID PANEL
CHOLESTEROL: 165 mg/dL (ref 0–200)
HDL: 47 mg/dL (ref >39)
LDL Cholesterol: 95 mg/dL (ref 0–99)
Total CHOL/HDL Ratio: 3.5 Ratio
Triglycerides: 113 mg/dL (ref <150)
VLDL: 23 mg/dL (ref 0–40)

## 2014-02-19 LAB — COMPREHENSIVE METABOLIC PANEL
ALT: 24 U/L (ref 0–35)
AST: 20 U/L (ref 0–37)
Albumin: 3.7 g/dL (ref 3.5–5.2)
Alkaline Phosphatase: 55 U/L (ref 39–117)
BUN: 12 mg/dL (ref 6–23)
CALCIUM: 8.7 mg/dL (ref 8.4–10.5)
CHLORIDE: 104 meq/L (ref 96–112)
CO2: 25 meq/L (ref 19–32)
CREATININE: 0.69 mg/dL (ref 0.50–1.10)
Glucose, Bld: 85 mg/dL (ref 70–99)
Potassium: 4.1 mEq/L (ref 3.5–5.3)
SODIUM: 137 meq/L (ref 135–145)
TOTAL PROTEIN: 6 g/dL (ref 6.0–8.3)
Total Bilirubin: 0.7 mg/dL (ref 0.2–1.2)

## 2014-02-20 LAB — FOLLICLE STIMULATING HORMONE: FSH: 7.8 m[IU]/mL

## 2014-02-20 LAB — THYROID PANEL WITH TSH
Free Thyroxine Index: 3.1 (ref 1.0–3.9)
T3 UPTAKE: 34.1 % (ref 22.5–37.0)
T4, Total: 9.2 ug/dL (ref 5.0–12.5)
TSH: 0.213 u[IU]/mL — AB (ref 0.350–4.500)

## 2014-02-20 LAB — HEPATITIS C ANTIBODY: HCV Ab: NEGATIVE

## 2014-02-22 ENCOUNTER — Other Ambulatory Visit: Payer: Self-pay | Admitting: Gynecology

## 2014-02-22 DIAGNOSIS — D649 Anemia, unspecified: Secondary | ICD-10-CM

## 2014-02-23 ENCOUNTER — Ambulatory Visit (INDEPENDENT_AMBULATORY_CARE_PROVIDER_SITE_OTHER): Payer: BC Managed Care – PPO | Admitting: Cardiovascular Disease

## 2014-02-23 ENCOUNTER — Encounter: Payer: Self-pay | Admitting: Cardiovascular Disease

## 2014-02-23 VITALS — BP 92/60 | HR 53 | Ht 64.0 in | Wt 137.1 lb

## 2014-02-23 DIAGNOSIS — R002 Palpitations: Secondary | ICD-10-CM

## 2014-02-23 NOTE — Patient Instructions (Signed)
Your physician wants you to follow-up in: 1 year with Dr Berry. You will receive a reminder letter in the mail two months in advance. If you don't receive a letter, please call our office to schedule the follow-up appointment.  

## 2014-02-23 NOTE — Assessment & Plan Note (Signed)
Since I saw her back 6 months ago and adjusted her beta blockers her palpitations have essentially resolved. She feels clinically improved other than the fact that she's mildly hypotensive and bradycardic he complains of some lethargy and fatigue. I told her there is a trade-off between lack of palpitations and energy as she prefers to stay on a beta blocker at this time.

## 2014-02-23 NOTE — Progress Notes (Signed)
02/23/2014 [name]   1956/05/22  [id number]  Primary Physician Terrance Mass, MD Primary Cardiologist: Lorretta Harp MD Renae Gloss   HPI:  The patient returns today for followup. She is a 58 year old thin and fit-appearing married Caucasian female, mother of 2, who works as an Futures trader. I had seen her initially 4 years ago, and she was referred back in September for chest pain and palpitations. She is on Adderall and has stopped drinking caffeine. She has had thyroid ablation and is having thyroid replacement, managed by Dr. Chalmers Cater. A Myoview stress test was normal. An event monitor just showed sinus tachycardia versus PSVT. I did begin her on low-dose beta blockade, which resulted in marked improvement in her clinical symptoms. Since I saw her back not 26/14 she says that she feels clinically improved as a result of up titration of her beta blocker although she is somewhat fatigued as well.     Current Outpatient Prescriptions  Medication Sig Dispense Refill  . acyclovir cream (ZOVIRAX) 5 % Take 1 application by mouth as needed.       . Amphetamine-Dextroamphetamine (ADDERALL PO) Take 15 mg by mouth daily.       . cetirizine (ZYRTEC) 10 MG tablet Take 10 mg by mouth daily.      . fish oil-omega-3 fatty acids 1000 MG capsule Take 2 g by mouth daily.        . Ginger, Zingiber officinalis, (GINGER PO) Take by mouth.        . levothyroxine (SYNTHROID, LEVOTHROID) 100 MCG tablet Take 100 mcg by mouth daily.      . metoprolol succinate (TOPROL-XL) 50 MG 24 hr tablet Take 1 tablet (50 mg total) by mouth daily.  90 tablet  3  . Misc Natural Products (TART CHERRY ADVANCED PO) Take by mouth.        . Sertraline HCl (ZOLOFT PO) Take 75 mg by mouth daily.       . valACYclovir (VALTREX) 1000 MG tablet Take 1,000 mg by mouth as needed.        No current facility-administered medications for this visit.    No Known Allergies  History   Social History  .  Marital Status: Married    Spouse Name: N/A    Number of Children: N/A  . Years of Education: N/A   Occupational History  . Not on file.   Social History Main Topics  . Smoking status: Never Smoker   . Smokeless tobacco: Never Used  . Alcohol Use: Yes     Comment: occ, two glasses of wine a week  . Drug Use: No  . Sexual Activity: Yes   Other Topics Concern  . Not on file   Social History Narrative  . No narrative on file     Review of Systems: General: negative for chills, fever, night sweats or weight changes.  Cardiovascular: negative for chest pain, dyspnea on exertion, edema, orthopnea, palpitations, paroxysmal nocturnal dyspnea or shortness of breath Dermatological: negative for rash Respiratory: negative for cough or wheezing Urologic: negative for hematuria Abdominal: negative for nausea, vomiting, diarrhea, bright red blood per rectum, melena, or hematemesis Neurologic: negative for visual changes, syncope, or dizziness All other systems reviewed and are otherwise negative except as noted above.    Blood pressure 92/60, pulse 53, height 5\' 4"  (1.626 m), weight 137 lb 1.6 oz (62.188 kg), last menstrual period 02/07/2014.  General appearance: alert and no distress Neck: no adenopathy, no carotid  bruit, no JVD, supple, symmetrical, trachea midline and thyroid not enlarged, symmetric, no tenderness/mass/nodules Lungs: clear to auscultation bilaterally Heart: regular rate and rhythm, S1, S2 normal, no murmur, click, rub or gallop Extremities: extremities normal, atraumatic, no cyanosis or edema  EKG sinus bradycardia at 53 without ST or T wave changes  ASSESSMENT AND PLAN:   Palpitations Since I saw her back 6 months ago and adjusted her beta blockers her palpitations have essentially resolved. She feels clinically improved other than the fact that she's mildly hypotensive and bradycardic he complains of some lethargy and fatigue. I told her there is a trade-off  between lack of palpitations and energy as she prefers to stay on a beta blocker at this time.      Lorretta Harp MD FACP,FACC,FAHA, [facility] 02/23/2014 8:50 AM

## 2014-04-01 ENCOUNTER — Ambulatory Visit (INDEPENDENT_AMBULATORY_CARE_PROVIDER_SITE_OTHER): Payer: BC Managed Care – PPO

## 2014-04-01 ENCOUNTER — Other Ambulatory Visit: Payer: Self-pay | Admitting: Gynecology

## 2014-04-01 DIAGNOSIS — Z13820 Encounter for screening for osteoporosis: Secondary | ICD-10-CM

## 2014-04-01 DIAGNOSIS — N951 Menopausal and female climacteric states: Secondary | ICD-10-CM

## 2014-04-05 ENCOUNTER — Other Ambulatory Visit: Payer: Self-pay | Admitting: *Deleted

## 2014-04-05 DIAGNOSIS — M8580 Other specified disorders of bone density and structure, unspecified site: Secondary | ICD-10-CM

## 2014-04-05 DIAGNOSIS — M898X9 Other specified disorders of bone, unspecified site: Secondary | ICD-10-CM

## 2014-05-27 ENCOUNTER — Other Ambulatory Visit: Payer: Self-pay | Admitting: Gynecology

## 2014-06-04 ENCOUNTER — Other Ambulatory Visit: Payer: BC Managed Care – PPO

## 2014-06-04 DIAGNOSIS — D649 Anemia, unspecified: Secondary | ICD-10-CM

## 2014-06-04 DIAGNOSIS — M898X9 Other specified disorders of bone, unspecified site: Secondary | ICD-10-CM

## 2014-06-04 DIAGNOSIS — M8580 Other specified disorders of bone density and structure, unspecified site: Secondary | ICD-10-CM

## 2014-06-04 LAB — CBC WITH DIFFERENTIAL/PLATELET
BASOS PCT: 1 % (ref 0–1)
Basophils Absolute: 0.1 10*3/uL (ref 0.0–0.1)
Eosinophils Absolute: 0.1 10*3/uL (ref 0.0–0.7)
Eosinophils Relative: 2 % (ref 0–5)
HEMATOCRIT: 31.9 % — AB (ref 36.0–46.0)
Hemoglobin: 10.7 g/dL — ABNORMAL LOW (ref 12.0–15.0)
LYMPHS ABS: 1.6 10*3/uL (ref 0.7–4.0)
Lymphocytes Relative: 27 % (ref 12–46)
MCH: 28.6 pg (ref 26.0–34.0)
MCHC: 33.5 g/dL (ref 30.0–36.0)
MCV: 85.3 fL (ref 78.0–100.0)
MONO ABS: 0.5 10*3/uL (ref 0.1–1.0)
Monocytes Relative: 8 % (ref 3–12)
Neutro Abs: 3.6 10*3/uL (ref 1.7–7.7)
Neutrophils Relative %: 62 % (ref 43–77)
Platelets: 283 10*3/uL (ref 150–400)
RBC: 3.74 MIL/uL — ABNORMAL LOW (ref 3.87–5.11)
RDW: 14 % (ref 11.5–15.5)
WBC: 5.8 10*3/uL (ref 4.0–10.5)

## 2014-06-05 LAB — VITAMIN D 25 HYDROXY (VIT D DEFICIENCY, FRACTURES): Vit D, 25-Hydroxy: 77 ng/mL (ref 30–89)

## 2014-07-14 ENCOUNTER — Telehealth: Payer: Self-pay

## 2014-07-14 NOTE — Telephone Encounter (Signed)
Left message to call.

## 2014-07-14 NOTE — Telephone Encounter (Signed)
Patient said she went to see Dr. Collene Mares as you recommended.  Dr. Collene Mares told her she needed to see you once again as Dr. Collene Mares thinks that her fibroids are what is causing her iron to be low. Patient wants to know what you recommend next?

## 2014-07-14 NOTE — Telephone Encounter (Signed)
Please schedule sonohysterogram in one-to 2 weeks with consultation

## 2014-07-14 NOTE — Telephone Encounter (Signed)
Patient informed. She knows appt desk will be calling her to schedule.

## 2014-07-19 ENCOUNTER — Other Ambulatory Visit: Payer: Self-pay | Admitting: Gynecology

## 2014-07-19 DIAGNOSIS — D500 Iron deficiency anemia secondary to blood loss (chronic): Secondary | ICD-10-CM

## 2014-07-19 DIAGNOSIS — D259 Leiomyoma of uterus, unspecified: Secondary | ICD-10-CM

## 2014-08-05 ENCOUNTER — Ambulatory Visit (INDEPENDENT_AMBULATORY_CARE_PROVIDER_SITE_OTHER): Payer: BC Managed Care – PPO

## 2014-08-05 ENCOUNTER — Other Ambulatory Visit: Payer: Self-pay | Admitting: Gynecology

## 2014-08-05 ENCOUNTER — Ambulatory Visit: Payer: BC Managed Care – PPO | Admitting: Gynecology

## 2014-08-05 ENCOUNTER — Ambulatory Visit (INDEPENDENT_AMBULATORY_CARE_PROVIDER_SITE_OTHER): Payer: BC Managed Care – PPO | Admitting: Gynecology

## 2014-08-05 ENCOUNTER — Other Ambulatory Visit: Payer: BC Managed Care – PPO

## 2014-08-05 DIAGNOSIS — N920 Excessive and frequent menstruation with regular cycle: Secondary | ICD-10-CM

## 2014-08-05 DIAGNOSIS — D251 Intramural leiomyoma of uterus: Secondary | ICD-10-CM

## 2014-08-05 DIAGNOSIS — Z8742 Personal history of other diseases of the female genital tract: Secondary | ICD-10-CM

## 2014-08-05 DIAGNOSIS — D500 Iron deficiency anemia secondary to blood loss (chronic): Secondary | ICD-10-CM

## 2014-08-05 DIAGNOSIS — D508 Other iron deficiency anemias: Secondary | ICD-10-CM

## 2014-08-05 DIAGNOSIS — N950 Postmenopausal bleeding: Secondary | ICD-10-CM

## 2014-08-05 DIAGNOSIS — D259 Leiomyoma of uterus, unspecified: Secondary | ICD-10-CM

## 2014-08-05 LAB — CBC WITH DIFFERENTIAL/PLATELET
BASOS ABS: 0 10*3/uL (ref 0.0–0.1)
Basophils Relative: 0 % (ref 0–1)
EOS PCT: 1 % (ref 0–5)
Eosinophils Absolute: 0.1 10*3/uL (ref 0.0–0.7)
HCT: 32.7 % — ABNORMAL LOW (ref 36.0–46.0)
Hemoglobin: 11 g/dL — ABNORMAL LOW (ref 12.0–15.0)
LYMPHS PCT: 27 % (ref 12–46)
Lymphs Abs: 1.9 10*3/uL (ref 0.7–4.0)
MCH: 28.7 pg (ref 26.0–34.0)
MCHC: 33.6 g/dL (ref 30.0–36.0)
MCV: 85.4 fL (ref 78.0–100.0)
MONO ABS: 0.6 10*3/uL (ref 0.1–1.0)
Monocytes Relative: 8 % (ref 3–12)
Neutro Abs: 4.5 10*3/uL (ref 1.7–7.7)
Neutrophils Relative %: 64 % (ref 43–77)
Platelets: 296 10*3/uL (ref 150–400)
RBC: 3.83 MIL/uL — ABNORMAL LOW (ref 3.87–5.11)
RDW: 13.9 % (ref 11.5–15.5)
WBC: 7 10*3/uL (ref 4.0–10.5)

## 2014-08-05 NOTE — Progress Notes (Signed)
   Patient presented to the office today as part of her evaluation for her chronic anemia. Patient with known history of fibroid uterus. Patient also has been followed by her endocrinologist Dr. Bubba Camp for her nontoxic multinodular goiter.patient informed me that in August of 2013 she had iodine 131 treatment for her thyroid. She is currently on levothyroid 100 mcg daily. Patient prior to that has had a subtotal thyroidectomy in the past. Patient had a normal colonoscopy approximately 3 years ago and due to her chronic anemia she had been referred back to her gastroenterologist for further evaluation of the chronic anemia and did not feel that she needed another colonoscopy since her Hemoccult testing was negative.Review of her record indicated also that she has had laparoscopic right salpingo-oophorectomy in the past and has had a history of stage IV endometriosis. In April 2013 her [facility] was 32.6 and when it was repeated in February 2014 it was 5.8. Patient has not had any vasomotor symptoms it has not been on any HRT. Patient had a Mirena IUD removed in 2013.   In March 2014 patient had an ultrasound which demonstrated the following: Uterus measured 8.9 x 7.3 x 6.3 cm with endometrial stripe of 6.5 mm. Patient with several small intramural myomas totaling 4 with her largest one measuring 23 x 16 mm. Patient with prior history of right salpingo-oophorectomy for severe endometriosis. Left ovary with thinwall echo-free avascular cyst measuring 56 x 53 x 56 mm. There was no fluid in the cul-de-sac. Endometrial biopsy at that office visit demonstrated the following:  Endometrium, biopsy, uterus - PROLIFERATIVE PATTERN ENDOMETRIUM. - NO HYPERPLASIA, ATYPIA OR MALIGNANCY IDENTIFIED.  Normal CA 125 and she received a shot of Depo-Provera 150 mg IM and returned back to the office on June 2014 and ultrasound demonstrated the following:  Uterus measuring 9.2 x 6.3 x 5.7 mm with endometrial stripe of 5.3 mm.  Patient was sooner small intramural myomas. Previous ovarian cyst completely resolved. No fluid in the cul-de-sac.  Sonohysterogram today: Uterus measures 7.9 x 6.6 x 4.8 cm with endometrial stripe at 2.1 mm. Patient had 3 intramural fibroids the largest measuring 32 x 23 mm. Right adnexa was negative due to the fact that she's had a right subcutaneous oophorectomy in the past. Left ovary was normal. No fluid in the cul-de-sac. The cervix was cleansed with Betadine solution and a sterile catheter was introduced into the cavity and saline was instilled and no intracavitary defect was noted.  Assessment/plan: Perimenopause/menopause patient with fluctuating FSH levels with history of anemia and menstrual cycles on and off. We will check her CBC today. Patient will continue her iron supplementation daily. We have discussed the placement of a Mirena IUD and then repeat her CBC in 6 months and if she has no menstrual cycle and she continues to be anemic I will then refer her to a hematologist for further evaluation of her chronic anemia since a gastroenterologist and not appear concerned about it coming from the GI tract. We did not do an endometrial biopsy today. She had endometrial stripe of 2.1 mm. Patient has not had a menstrual cycle in 6 weeks. Prior to that she was having one every 4 weeks which she had reported as being heavy.

## 2014-08-06 ENCOUNTER — Other Ambulatory Visit: Payer: Self-pay | Admitting: Gynecology

## 2014-08-06 DIAGNOSIS — D500 Iron deficiency anemia secondary to blood loss (chronic): Secondary | ICD-10-CM

## 2014-08-11 ENCOUNTER — Telehealth: Payer: Self-pay | Admitting: Gynecology

## 2014-08-11 NOTE — Telephone Encounter (Signed)
08/11/15-LM VM for pt to call. I checked her benefits for the Mirena with med dx per JF and was told it was covered at 100%, no copay. BC Ref #[id number]. wl

## 2014-08-13 ENCOUNTER — Other Ambulatory Visit: Payer: Self-pay | Admitting: Gynecology

## 2014-08-13 DIAGNOSIS — Z3049 Encounter for surveillance of other contraceptives: Secondary | ICD-10-CM

## 2014-08-13 MED ORDER — LEVONORGESTREL 20 MCG/24HR IU IUD: INTRAUTERINE_SYSTEM | Freq: Once | INTRAUTERINE | Status: DC

## 2014-08-21 ENCOUNTER — Other Ambulatory Visit: Payer: Self-pay | Admitting: Cardiovascular Disease

## 2014-08-23 NOTE — Telephone Encounter (Signed)
Rx was sent to pharmacy electronically. 

## 2014-09-22 ENCOUNTER — Ambulatory Visit (INDEPENDENT_AMBULATORY_CARE_PROVIDER_SITE_OTHER): Payer: BC Managed Care – PPO | Admitting: Gynecology

## 2014-09-22 ENCOUNTER — Encounter: Payer: Self-pay | Admitting: Gynecology

## 2014-09-22 VITALS — BP 118/76

## 2014-09-22 DIAGNOSIS — Z30430 Encounter for insertion of intrauterine contraceptive device: Secondary | ICD-10-CM

## 2014-09-22 DIAGNOSIS — Z975 Presence of (intrauterine) contraceptive device: Secondary | ICD-10-CM | POA: Insufficient documentation

## 2014-09-22 NOTE — Progress Notes (Signed)
11 sexual patient presented to the office today for placement of Mirena IUD. Patient's GYN history as follows:  Review of her record indicated also that she has had laparoscopic right salpingo-oophorectomy in the past and has had a history of stage IV endometriosis. In April 2013 her [facility] was 32.6 and when it was repeated in February 2014 it was 5.8. Patient has not had any vasomotor symptoms it has not been on any HRT. Patient had a Mirena IUD removed in 2013.   In March 2014 patient had an ultrasound which demonstrated the following:  Uterus measured 8.9 x 7.3 x 6.3 cm with endometrial stripe of 6.5 mm. Patient with several small intramural myomas totaling 4 with her largest one measuring 23 x 16 mm. Patient with prior history of right salpingo-oophorectomy for severe endometriosis. Left ovary with thinwall echo-free avascular cyst measuring 56 x 53 x 56 mm. There was no fluid in the cul-de-sac. Endometrial biopsy at that office visit demonstrated the following:  Endometrium, biopsy, uterus  - PROLIFERATIVE PATTERN ENDOMETRIUM.  - NO HYPERPLASIA, ATYPIA OR MALIGNANCY IDENTIFIED.  Normal CA 125 and she received a shot of Depo-Provera 150 mg IM and returned back to the office on June 2014 and ultrasound demonstrated the following:  Uterus measuring 9.2 x 6.3 x 5.7 mm with endometrial stripe of 5.3 mm. Patient was sooner small intramural myomas. Previous ovarian cyst completely resolved. No fluid in the cul-de-sac  Sonohysterogram today:  Uterus measures 7.9 x 6.6 x 4.8 cm with endometrial stripe at 2.1 mm. Patient had 3 intramural fibroids the largest measuring 32 x 23 mm. Right adnexa was negative due to the fact that she's had a right subcutaneous oophorectomy in the past. Left ovary was normal. No fluid in the cul-de-sac. The cervix was cleansed with Betadine solution and a sterile catheter was introduced into the cavity and saline was instilled and no intracavitary defect was  noted.  Perimenopause/menopause patient with fluctuating FSH levels with history of anemia with heavy irregular cycles. Patient on iron supplementation. Patient scheduled to return back for follow-up CBC in 6 months. She is here for placement of Mirena IUD.                                                                    IUD procedure note       Patient presented to the office today for placement of Mirena IUD. The patient had previously been provided with literature information on this method of contraception. The risks benefits and pros and cons were discussed and all her questions were answered. She is fully aware that this form of contraception is 99% effective and is good for 5 years.  Pelvic exam: Bartholin urethra Skene glands: Within normal limits Vagina: No lesions or discharge Cervix: No lesions or discharge Uterus: Retroverted position Adnexa: No masses or tenderness Rectal exam: Not done  The cervix was cleansed with Betadine solution. A single-tooth tenaculum was placed on the anterior cervical lip. The uterus sounded to 9/2 centimeter. The IUD was shown to the patient and inserted in a sterile fashion. The IUD string was trimmed. The single-tooth tenaculum was removed. Patient was instructed to return back to the office in one month for follow up.  Number [id number]

## 2014-09-22 NOTE — Patient Instructions (Signed)
Levonorgestrel intrauterine device (IUD) What is this medicine? LEVONORGESTREL IUD (LEE voe nor jes trel) is a contraceptive (birth control) device. The device is placed inside the uterus by a healthcare professional. It is used to prevent pregnancy and can also be used to treat heavy bleeding that occurs during your period. Depending on the device, it can be used for 3 to 5 years. This medicine may be used for other purposes; ask your health care provider or pharmacist if you have questions. COMMON BRAND NAME(S): LILETTA, Mirena, Skyla What should I tell my health care provider before I take this medicine? They need to know if you have any of these conditions: -abnormal Pap smear -cancer of the breast, uterus, or cervix -diabetes -endometritis -genital or pelvic infection now or in the past -have more than one sexual partner or your partner has more than one partner -heart disease -history of an ectopic or tubal pregnancy -immune system problems -IUD in place -liver disease or tumor -problems with blood clots or take blood-thinners -use intravenous drugs -uterus of unusual shape -vaginal bleeding that has not been explained -an unusual or allergic reaction to levonorgestrel, other hormones, silicone, or polyethylene, medicines, foods, dyes, or preservatives -pregnant or trying to get pregnant -breast-feeding How should I use this medicine? This device is placed inside the uterus by a health care professional. Talk to your pediatrician regarding the use of this medicine in children. Special care may be needed. Overdosage: If you think you have taken too much of this medicine contact a poison control center or emergency room at once. NOTE: This medicine is only for you. Do not share this medicine with others. What if I miss a dose? This does not apply. What may interact with this medicine? Do not take this medicine with any of the following  medications: -amprenavir -bosentan -fosamprenavir This medicine may also interact with the following medications: -aprepitant -barbiturate medicines for inducing sleep or treating seizures -bexarotene -griseofulvin -medicines to treat seizures like carbamazepine, ethotoin, felbamate, oxcarbazepine, phenytoin, topiramate -modafinil -pioglitazone -rifabutin -rifampin -rifapentine -some medicines to treat HIV infection like atazanavir, indinavir, lopinavir, nelfinavir, tipranavir, ritonavir -St. John's wort -warfarin This list may not describe all possible interactions. Give your health care provider a list of all the medicines, herbs, non-prescription drugs, or dietary supplements you use. Also tell them if you smoke, drink alcohol, or use illegal drugs. Some items may interact with your medicine. What should I watch for while using this medicine? Visit your doctor or health care professional for regular check ups. See your doctor if you or your partner has sexual contact with others, becomes HIV positive, or gets a sexual transmitted disease. This product does not protect you against HIV infection (AIDS) or other sexually transmitted diseases. You can check the placement of the IUD yourself by reaching up to the top of your vagina with clean fingers to feel the threads. Do not pull on the threads. It is a good habit to check placement after each menstrual period. Call your doctor right away if you feel more of the IUD than just the threads or if you cannot feel the threads at all. The IUD may come out by itself. You may become pregnant if the device comes out. If you notice that the IUD has come out use a backup birth control method like condoms and call your health care provider. Using tampons will not change the position of the IUD and are okay to use during your period. What side effects may   I notice from receiving this medicine? Side effects that you should report to your doctor or  health care professional as soon as possible: -allergic reactions like skin rash, itching or hives, swelling of the face, lips, or tongue -fever, flu-like symptoms -genital sores -high blood pressure -no menstrual period for 6 weeks during use -pain, swelling, warmth in the leg -pelvic pain or tenderness -severe or sudden headache -signs of pregnancy -stomach cramping -sudden shortness of breath -trouble with balance, talking, or walking -unusual vaginal bleeding, discharge -yellowing of the eyes or skin Side effects that usually do not require medical attention (report to your doctor or health care professional if they continue or are bothersome): -acne -breast pain -change in sex drive or performance -changes in weight -cramping, dizziness, or faintness while the device is being inserted -headache -irregular menstrual bleeding within first 3 to 6 months of use -nausea This list may not describe all possible side effects. Call your doctor for medical advice about side effects. You may report side effects to FDA at 1-800-FDA-1088. Where should I keep my medicine? This does not apply. NOTE: This sheet is a summary. It may not cover all possible information. If you have questions about this medicine, talk to your doctor, pharmacist, or health care provider.  2015, Elsevier/Gold Standard. (2011-12-13 13:54:04)  

## 2014-09-23 ENCOUNTER — Encounter: Payer: Self-pay | Admitting: Gynecology

## 2014-09-27 ENCOUNTER — Encounter: Payer: Self-pay | Admitting: Gynecology

## 2014-10-18 ENCOUNTER — Telehealth: Payer: Self-pay | Admitting: *Deleted

## 2014-10-18 NOTE — Telephone Encounter (Signed)
Pt had mirena iud placed on 09/22/14 started a cycle 1 week after placement, heavy bleeding lasted about 12 days. Pt is spotting now only but daily, she has 1 month follow up visit scheduled on Wednesday 10/20/14. I told pt not usual to having irregular bleeding after placement of IUD and since bleeding is no longer heavy okay to wait until OV Wednesday to be examined. I told pt I would relay information to you to see if you advised anything different.

## 2014-10-18 NOTE — Telephone Encounter (Signed)
Yes

## 2014-10-18 NOTE — Telephone Encounter (Signed)
Pt informed with the below note. 

## 2014-10-20 ENCOUNTER — Telehealth: Payer: Self-pay | Admitting: *Deleted

## 2014-10-20 ENCOUNTER — Ambulatory Visit (HOSPITAL_COMMUNITY)
Admission: RE | Admit: 2014-10-20 | Discharge: 2014-10-20 | Disposition: A | Discharge status: Home / Self Care | Payer: BC Managed Care – PPO | Source: Ambulatory Visit | Attending: Gynecology | Admitting: Gynecology

## 2014-10-20 ENCOUNTER — Encounter: Payer: Self-pay | Admitting: Gynecology

## 2014-10-20 ENCOUNTER — Ambulatory Visit (INDEPENDENT_AMBULATORY_CARE_PROVIDER_SITE_OTHER): Payer: BC Managed Care – PPO | Admitting: Gynecology

## 2014-10-20 VITALS — BP 128/76

## 2014-10-20 DIAGNOSIS — M79661 Pain in right lower leg: Secondary | ICD-10-CM

## 2014-10-20 DIAGNOSIS — D500 Iron deficiency anemia secondary to blood loss (chronic): Secondary | ICD-10-CM

## 2014-10-20 DIAGNOSIS — D251 Intramural leiomyoma of uterus: Secondary | ICD-10-CM

## 2014-10-20 DIAGNOSIS — M79609 Pain in unspecified limb: Secondary | ICD-10-CM

## 2014-10-20 DIAGNOSIS — M79651 Pain in right thigh: Secondary | ICD-10-CM

## 2014-10-20 DIAGNOSIS — Z30431 Encounter for routine checking of intrauterine contraceptive device: Secondary | ICD-10-CM

## 2014-10-20 LAB — CBC WITH DIFFERENTIAL/PLATELET
Basophils Absolute: 0.1 10*3/uL (ref 0.0–0.1)
Basophils Relative: 1 % (ref 0–1)
EOS ABS: 0.2 10*3/uL (ref 0.0–0.7)
Eosinophils Relative: 3 % (ref 0–5)
HEMATOCRIT: 37.3 % (ref 36.0–46.0)
HEMOGLOBIN: 12 g/dL (ref 12.0–15.0)
LYMPHS ABS: 1.7 10*3/uL (ref 0.7–4.0)
Lymphocytes Relative: 28 % (ref 12–46)
MCH: 28.2 pg (ref 26.0–34.0)
MCHC: 32.2 g/dL (ref 30.0–36.0)
MCV: 87.8 fL (ref 78.0–100.0)
MONO ABS: 0.5 10*3/uL (ref 0.1–1.0)
MONOS PCT: 8 % (ref 3–12)
MPV: 10.8 fL (ref 9.4–12.4)
NEUTROS PCT: 60 % (ref 43–77)
Neutro Abs: 3.7 10*3/uL (ref 1.7–7.7)
Platelets: 295 10*3/uL (ref 150–400)
RBC: 4.25 MIL/uL (ref 3.87–5.11)
RDW: 14.1 % (ref 11.5–15.5)
WBC: 6.1 10*3/uL (ref 4.0–10.5)

## 2014-10-20 MED ORDER — IBUPROFEN 800 MG PO TABS: 800.0000 mg | ORAL_TABLET | Freq: Three times a day (TID) | ORAL | Status: DC | PRN

## 2014-10-20 NOTE — Progress Notes (Signed)
*  Preliminary Results* Right lower extremity venous duplex completed. Right lower extremity is negative for deep vein thrombosis. There is no evidence of right Baker's cyst.  Preliminary results discussed with Anderson Malta of Dr.Fernandez's office.  10/20/2014 12:46 PM  Maudry Mayhew, RVT, RDCS, RDMS

## 2014-10-20 NOTE — Progress Notes (Signed)
Patient presented to the office today for her 1 month follow-up after having placed the Mirena IUD not as much for contraception but for cycle control since she has had chronic anemia. Patient 2 weeks ago was on an airplane trip for about 4 hours and she began experiencing the past few days right medial thigh pain on walking and on minimal activity. She denies any numbness or tingling. Her history as follows:  Review of her record indicated also that she has had laparoscopic right salpingo-oophorectomy in the past and has had a history of stage IV endometriosis. In April 2013 her [facility] was 32.6 and when it was repeated in February 2014 it was 5.8. Patient has not had any vasomotor symptoms it has not been on any HRT. Patient had a Mirena IUD removed in 2013.   In March 2014 patient had an ultrasound which demonstrated the following:  Uterus measured 8.9 x 7.3 x 6.3 cm with endometrial stripe of 6.5 mm. Patient with several small intramural myomas totaling 4 with her largest one measuring 23 x 16 mm. Patient with prior history of right salpingo-oophorectomy for severe endometriosis. Left ovary with thinwall echo-free avascular cyst measuring 56 x 53 x 56 mm. There was no fluid in the cul-de-sac. Endometrial biopsy at that office visit demonstrated the following:  Endometrium, biopsy, uterus  - PROLIFERATIVE PATTERN ENDOMETRIUM.  - NO HYPERPLASIA, ATYPIA OR MALIGNANCY IDENTIFIED.  Normal CA 125 and she received a shot of Depo-Provera 150 mg IM and returned back to the office on June 2014 and ultrasound demonstrated the following:  Ultrasound 09/22/2014 as follows: Uterus measuring 9.2 x 6.3 x 5.7 mm with endometrial stripe of 5.3 mm. Patient was sooner small intramural myomas. Previous ovarian cyst completely resolved. No fluid in the cul-de-sac  Sonohysterogram today:  Uterus measures 7.9 x 6.6 x 4.8 cm with endometrial stripe at 2.1 mm. Patient had 3 intramural fibroids the largest measuring  32 x 23 mm. Right adnexa was negative due to the fact that she's had a right salpingo-oophorectomy in the past. Left ovary was normal. No fluid in the cul-de-sac. The cervix was cleansed with Betadine solution and a sterile catheter was introduced into the cavity and saline was instilled and no intracavitary defect was noted.   Perimenopause/menopause patient with fluctuating FSH levels with history of anemia with heavy irregular cycles. Patient on iron supplementation. Patient sent no complaints with the IUD except she has some bleeding initially after the insertion. Exam: Pelvic: Bartholin urethra Skene was within normal limits Vagina: No lesions or discharge Cervix: IUD string visualized the IUD string was trimmed Bimanual exam: Uterus anteverted normal size shape and consistency Adnexa: No palpable masses or tenderness Rectal exam not done  Lower extremity exam: Patient with bilateral normal calf and thigh circumference. No pitting edema on either or extremity. Negative Homans sign on both legs. No calf tenderness on either leg. Patient was extremely tender on the medial aspect of the right thigh.  Assessment/plan: #1 chronic anemia on iron supplementation we'll check CBC today #2 1 month follow-up after Mirena IUD doing well otherwise #3 right medial thigh pain rule out DVT. Patient will be sent to the [facility] vascular lab this morning for further evaluation. #4 if no DVT is identified patient will be placed on an anti-inflammatory is as well as a muscle relaxant for one week as follows: Motrin 800 mg 3 times a day and Flexeril 10 mg 1 by mouth twice a day along with warm moist compression  and no physical exercise activity during that time such as tennis like she does regularly.

## 2014-10-20 NOTE — Telephone Encounter (Signed)
-----   Message from Terrance Mass, MD sent at 10/20/2014 12:07 PM EST ----- Please call her and tell her that I got the report this is good news. Tell her to begin taking ibuprofen or Motrin 800 mg 3 times a day for the next 5-7 days with food as well as the Flexeril that I prescribed as well. Also to apply local heat to the area 2-3 times a day. Patient not to play tennis or vigorous exercise for the next week. ----- Message -----    From: Kelvin Cellar    Sent: 10/20/2014  12:00 PM      To: Terrance Mass, MD  Dr. Toney Rakes  Pt doppler study was negative for DVT.

## 2014-10-20 NOTE — Telephone Encounter (Signed)
Doppler scheduled today at [place] @ 11:30 am pt will go to [facility]	[place].

## 2014-10-20 NOTE — Telephone Encounter (Signed)
Pt informed, rx sent for motrin

## 2015-01-24 ENCOUNTER — Other Ambulatory Visit: Payer: Self-pay | Admitting: Gynecology

## 2015-01-24 DIAGNOSIS — D500 Iron deficiency anemia secondary to blood loss (chronic): Secondary | ICD-10-CM

## 2015-02-03 ENCOUNTER — Other Ambulatory Visit: Payer: Self-pay | Admitting: Gynecology

## 2015-03-15 ENCOUNTER — Other Ambulatory Visit: Payer: Self-pay | Admitting: Cardiovascular Disease

## 2015-03-17 ENCOUNTER — Telehealth: Payer: Self-pay | Admitting: Cardiovascular Disease

## 2015-03-18 NOTE — Telephone Encounter (Signed)
Close encounter 

## 2015-04-14 ENCOUNTER — Other Ambulatory Visit: Payer: Self-pay | Admitting: Cardiovascular Disease

## 2015-04-14 NOTE — Telephone Encounter (Signed)
Rx has been sent to the pharmacy electronically. ° °

## 2015-05-05 ENCOUNTER — Ambulatory Visit: Payer: Self-pay | Admitting: Women's Health

## 2015-05-13 ENCOUNTER — Ambulatory Visit: Payer: Self-pay | Admitting: Cardiovascular Disease

## 2015-05-18 ENCOUNTER — Encounter (HOSPITAL_COMMUNITY): Payer: Self-pay | Admitting: Emergency Medicine

## 2015-05-18 ENCOUNTER — Emergency Department (HOSPITAL_COMMUNITY)
Admission: EM | Admit: 2015-05-18 | Discharge: 2015-05-18 | Disposition: A | Discharge status: Home / Self Care | Payer: BLUE CROSS/BLUE SHIELD | Attending: Emergency Medicine | Admitting: Emergency Medicine

## 2015-05-18 ENCOUNTER — Encounter: Payer: Self-pay | Admitting: Gynecology

## 2015-05-18 ENCOUNTER — Ambulatory Visit (INDEPENDENT_AMBULATORY_CARE_PROVIDER_SITE_OTHER): Payer: BLUE CROSS/BLUE SHIELD | Admitting: Gynecology

## 2015-05-18 VITALS — BP 126/70 | Ht 64.0 in | Wt 141.0 lb

## 2015-05-18 DIAGNOSIS — Z86010 Personal history of colonic polyps: Secondary | ICD-10-CM | POA: Diagnosis not present

## 2015-05-18 DIAGNOSIS — M1990 Unspecified osteoarthritis, unspecified site: Secondary | ICD-10-CM | POA: Insufficient documentation

## 2015-05-18 DIAGNOSIS — R197 Diarrhea, unspecified: Secondary | ICD-10-CM | POA: Diagnosis not present

## 2015-05-18 DIAGNOSIS — X58XXXA Exposure to other specified factors, initial encounter: Secondary | ICD-10-CM | POA: Diagnosis not present

## 2015-05-18 DIAGNOSIS — Y999 Unspecified external cause status: Secondary | ICD-10-CM | POA: Diagnosis not present

## 2015-05-18 DIAGNOSIS — N951 Menopausal and female climacteric states: Secondary | ICD-10-CM | POA: Diagnosis not present

## 2015-05-18 DIAGNOSIS — Z79899 Other long term (current) drug therapy: Secondary | ICD-10-CM | POA: Diagnosis not present

## 2015-05-18 DIAGNOSIS — N393 Stress incontinence (female) (male): Secondary | ICD-10-CM

## 2015-05-18 DIAGNOSIS — Z8659 Personal history of other mental and behavioral disorders: Secondary | ICD-10-CM | POA: Diagnosis not present

## 2015-05-18 DIAGNOSIS — Y929 Unspecified place or not applicable: Secondary | ICD-10-CM | POA: Diagnosis not present

## 2015-05-18 DIAGNOSIS — Y939 Activity, unspecified: Secondary | ICD-10-CM | POA: Insufficient documentation

## 2015-05-18 DIAGNOSIS — Z8639 Personal history of other endocrine, nutritional and metabolic disease: Secondary | ICD-10-CM | POA: Insufficient documentation

## 2015-05-18 DIAGNOSIS — Z01419 Encounter for gynecological examination (general) (routine) without abnormal findings: Secondary | ICD-10-CM | POA: Diagnosis not present

## 2015-05-18 DIAGNOSIS — T7840XD Allergy, unspecified, subsequent encounter: Secondary | ICD-10-CM | POA: Diagnosis not present

## 2015-05-18 DIAGNOSIS — E042 Nontoxic multinodular goiter: Secondary | ICD-10-CM

## 2015-05-18 DIAGNOSIS — T7840XA Allergy, unspecified, initial encounter: Secondary | ICD-10-CM | POA: Insufficient documentation

## 2015-05-18 MED ORDER — HYDROXYZINE HCL 25 MG PO TABS
25.0000 mg | ORAL_TABLET | Freq: Once | ORAL | Status: AC
  Administered 2015-05-18: 25 mg via ORAL
  Filled 2015-05-18: qty 1

## 2015-05-18 MED ORDER — METHYLPREDNISOLONE SODIUM SUCC 125 MG IJ SOLR
125.0000 mg | Freq: Once | INTRAMUSCULAR | Status: AC
  Administered 2015-05-18: 125 mg via INTRAMUSCULAR
  Filled 2015-05-18: qty 2

## 2015-05-18 MED ORDER — DIPHENHYDRAMINE HCL 25 MG PO CAPS
50.0000 mg | ORAL_CAPSULE | Freq: Once | ORAL | Status: AC
  Administered 2015-05-18: 50 mg via ORAL
  Filled 2015-05-18: qty 2

## 2015-05-18 MED ORDER — HYDROXYZINE HCL 25 MG PO TABS: 25.0000 mg | ORAL_TABLET | Freq: Four times a day (QID) | ORAL | Status: DC | PRN

## 2015-05-18 MED ORDER — FAMOTIDINE 20 MG PO TABS
20.0000 mg | ORAL_TABLET | Freq: Once | ORAL | Status: AC
  Administered 2015-05-18: 20 mg via ORAL
  Filled 2015-05-18: qty 1

## 2015-05-18 MED ORDER — PREDNISONE 20 MG PO TABS: ORAL_TABLET | ORAL | Status: DC

## 2015-05-18 NOTE — ED Notes (Signed)
Pt states that she has been taking lomotil since last Thursday but thinks that she may be having an allergic reaction to it. Swollen lips, hives about the body. Airway intact. Speaking in complete sentences. Alert and oriented.

## 2015-05-18 NOTE — Progress Notes (Signed)
[name] [birth year] [id number]   History:    59 y.o.  for annual gyn exam who was seen in the emergency room early today as a result of a hypersensitivity reaction either from fluid or from her medication that was provided to her at an urgent care last week as a result of her diarrhea. Although she stated that she had flown also as well to [place] and handed tender that evening and had symptoms shortly thereafter so not certain if it was due to the medication Diphenenoxylate/atropine was found that she [address] [place]. She was recently prescribed this morning Vistaril as well as prednisone pack to help with her allergic reaction. She had mentioned that 6 days ago at the urgent care in [place] when she was having explosive diarrhea as they did a culture for C. difficile for which we do not have the results.  Patient had a Mirena IUD placed last year and occasionally will have a light menstrual cycle. Patient has had fluctuating FSH levels were normal to elevation and this is the reason why during the perimenopausal phase she was placed on the Mirena IUD.  Patient has been followed by her endocrinologist Dr. Bubba Camp for her nontoxic multinodular goiter.patient informed me that in August of 2013 she had iodine 131 treatment for her thyroid. She is currently on levothyroid 100 mcg daily. Patient prior to that has had a subtotal thyroidectomy in the past. Patient has informed me that she is seen another provider instead of this endocrinologist for her thyroid and is  Dr.Wiggy Tye Savoy who is been doing her blood work.   Review of her record indicated also that she has had laparoscopic right salpingo-oophorectomy in the past and has had a history of stage IV endometriosis. She also suffers from HSV but infrequently. Patient with no prior history of abnormal Pap smears. Patient had a normal bone density study in 2015. She also had a normal colonoscopy in 2012.  Patient brought to my attention that  sometimes when she coughs or sneeze she leaks urine. She denies any nocturia.  Past medical history,surgical history, family history and social history were all reviewed and documented in the EPIC chart.  Gynecologic History Patient's last menstrual period was 03/22/2015. Contraception: IUD Last Pap: 2015. Results were: normal Last mammogram: 2015normal. Results were: normal  Obstetric History OB History  Gravida Para Term Preterm AB SAB TAB Ectopic Multiple Living  2 2 2       2     # Outcome Date GA Lbr Len/2nd Weight Sex Delivery Anes PTL Lv  2 Term     M Vag-Spont  N Y  1 Term     F Vag-Spont  N Y       ROS: A ROS was performed and pertinent positives and negatives are included in the history.  GENERAL: No fevers or chills. HEENT: No change in vision, no earache, sore throat or sinus congestion. NECK: No pain or stiffness. CARDIOVASCULAR: No chest pain or pressure. No palpitations. PULMONARY: No shortness of breath, cough or wheeze. GASTROINTESTINAL: No abdominal pain, nausea, vomiting or diarrhea, melena or bright red blood per rectum. GENITOURINARY: No urinary frequency, urgency, hesitancy or dysuria. MUSCULOSKELETAL: No joint or muscle pain, no back pain, no recent trauma. DERMATOLOGIC: No rash, no itching, no lesions. ENDOCRINE: No polyuria, polydipsia, no heat or cold intolerance. No recent change in weight. HEMATOLOGICAL: No anemia or easy bruising or bleeding. NEUROLOGIC: No headache, seizures, numbness, tingling or weakness. PSYCHIATRIC: No depression,  no loss of interest in normal activity or change in sleep pattern.     Exam: chaperone present  BP 126/70 mmHg  Ht 5\' 4"  (1.626 m)  Wt 141 lb (63.957 kg)  BMI 24.19 kg/m2  LMP 03/22/2015  Body mass index is 24.19 kg/(m^2).  General appearance : Well developed well nourished female. No acute distress HEENT: Eyes: no retinal hemorrhage or exudates,  Neck supple, trachea midline, no carotid bruits, no thyroidmegaly Lungs:  Clear to auscultation, no rhonchi or wheezes, or rib retractions  Heart: Regular rate and rhythm, no murmurs or gallops Breast:Examined in sitting and supine position were symmetrical in appearance, no palpable masses or tenderness,  no skin retraction, no nipple inversion, no nipple discharge, no skin discoloration, no axillary or supraclavicular lymphadenopathy Abdomen: no palpable masses or tenderness, no rebound or guarding Extremities: no edema or skin discoloration or tenderness  Pelvic:  Bartholin, Urethra, Skene Glands: Within normal limits             Vagina: No gross lesions or discharge  Cervix: No gross lesions or discharge, IUD string visualized  Uterus  anteverted, normal size, shape and consistency, non-tender and mobile  Adnexa  Without masses or tenderness  Anus and perineum  normal   Rectovaginal  normal sphincter tone without palpated masses or tenderness             Hemoccult cards provided     Assessment/Plan:  59 y.o. female for annual exam with anaphylactic reaction uncertain if to do medication or food. Whether it's immediate or delayed hypersensitivity reaction. Patient seen in the emergency room this morning was started on Vistaril and prednisone pack. Patient with history of multinodular goiter being followed by different provider now in [place] we'll try to obtain records to update our records as well. I have recommended that she be followed by a local endocrinologist and I can give her different name and she will let us know if she is not happy with a new provider. We obtained a medical release form to obtain results from the C. difficile culture that was done 6 days ago in [place]. Patient to schedule her mammogram later this year. We discussed importance of calcium vitamin D and regular exercise for osteoporosis prevention. At the completion of the examination patient stated that after she coughs or sneeze she leaks urine. I'm going to  provided with literature information on stress urinary incontinence, urodynamically testing as well as overactive bladder. I'm also going to provide her with information on Kegel exercises.  Terrance Mass MD, 11:22 AM 05/18/2015

## 2015-05-18 NOTE — ED Notes (Signed)
She is awake, alert and oriented x 4 with clear speech.  She is happy that her swelling (esp. Of lips) has almost dissipated.  She has no c/o nor requests at this time.

## 2015-05-18 NOTE — ED Provider Notes (Signed)
CSN: [id number]     Arrival date & time 05/18/15  0119 History   First MD Initiated Contact with Patient 05/18/15 0122     Chief Complaint  Patient presents with  . Allergic Reaction     (Consider location/radiation/quality/duration/timing/severity/associated sxs/prior Treatment) HPI Patient presents with 24 hours of raised erythematous and pruritic rash starting in the lower extremities and spreading to the trunk and upper extremities. She noticed upper and lower lip swelling this evening. States symptoms started after dinner yesterday evening. No known food exposures. No new detergents or shampoos. Patient states she's been taking Lomotil for diarrhea for the past week and there's been no other changes to her medications. She denies any intraoral swelling. No stridor or wheezing. No nausea or vomiting. Past Medical History  Diagnosis Date  . Rotator cuff disorder     TEAR-  . Multinodular goiter   . Depression     PMDD  . Colon polyps 2008    BENIGN  . Raynaud's phenomenon   . Hyperlipidemia   . Arrhythmia 2013    PVCs,-monitor  . Palpitations   . Arthritis    Past Surgical History  Procedure Laterality Date  . Oophorectomy  2010    LAP RSO  . Rotator cuff repair    . Thyroid lobectomy      RIGHT  . Doppler echocardiography  08/14/2008    EF => 55% , no valvular pathology  . Nm myocar perf wall motion  09/04/2012    EF 66% ,LV normal, exercise capcity 11 mets -completeing stae 3 and 1 minute into stage 4,developed some mild chest pain and throat  tightness caused to stop exercise. [facility] TM scor  2 - MOD RISK  . Event monitor  08/15/2012-08/28/2012    NSR with PVC's ,PSVT vs Aflutter WITH 2:1   Family History  Problem Relation Age of Onset  . Osteoporosis Father   . Hypertension Father   . Lung cancer Father   . Brain cancer Father   . Hypertension Mother   . Heart attack Mother 63  . Migraines Child    History  Substance Use Topics  . Smoking status: Never  Smoker   . Smokeless tobacco: Never Used  . Alcohol Use: Yes     Comment: occ, two glasses of wine a week   OB History    Gravida Para Term Preterm AB TAB SAB Ectopic Multiple Living   2 2 2       2      Review of Systems  Constitutional: Negative for fever and chills.  HENT: Positive for facial swelling.   Respiratory: Negative for shortness of breath and wheezing.   Cardiovascular: Negative for leg swelling.  Gastrointestinal: Positive for diarrhea. Negative for nausea and vomiting.  Musculoskeletal: Negative for myalgias and neck stiffness.  Skin: Positive for rash.  Neurological: Negative for dizziness, weakness, light-headedness and numbness.  All other systems reviewed and are negative.     Allergies  Review of patient's allergies indicates no known allergies.  Home Medications   Prior to Admission medications   Medication Sig Start Date End Date Taking? Authorizing Provider  amphetamine-dextroamphetamine (ADDERALL) 15 MG tablet take 1 tablet by mouth twice a day AT LEAST 6 HOURS APART 05/04/15  Yes Historical Provider, MD  cetirizine (ZYRTEC) 10 MG tablet Take 10 mg by mouth daily.   Yes Historical Provider, MD  diphenoxylate-atropine (LOMOTIL) 2.5-0.025 MG per tablet Take 2 tablets by mouth 2 (two) times daily. Start 05/10/15 for ten  days   Yes Historical Provider, MD  ibuprofen (ADVIL,MOTRIN) 800 MG tablet Take 1 tablet (800 mg total) by mouth 3 (three) times daily as needed. 10/20/14  Yes Terrance Mass, MD  metoprolol succinate (TOPROL-XL) 50 MG 24 hr tablet take 1 tablet by mouth once daily 04/14/15  Yes Lorretta Harp, MD  sertraline (ZOLOFT) 50 MG tablet Take 75 mg by mouth daily.  05/11/15  Yes Historical Provider, MD  thyroid (ARMOUR) 65 MG tablet Take 65 mg by mouth daily.   Yes Historical Provider, MD  hydrOXYzine (ATARAX/VISTARIL) 25 MG tablet Take 1 tablet (25 mg total) by mouth every 6 (six) hours as needed for itching. 05/18/15   Julianne Rice, MD   predniSONE (DELTASONE) 20 MG tablet 3 tabs po day one, then 2 po daily x 4 days 05/18/15   Julianne Rice, MD   BP 91/48 mmHg  Pulse 83  Temp(Src) 98.2 F (36.8 C) (Oral)  Resp 16  SpO2 100%  LMP 03/18/2015 (Approximate) Physical Exam  Constitutional: She is oriented to person, place, and time. She appears well-developed and well-nourished. No distress.  HENT:  Head: Normocephalic and atraumatic.  Mouth/Throat: Oropharynx is clear and moist.  Bilateral upper and lower lip swelling. There is no intraoral swelling.  Eyes: EOM are normal. Pupils are equal, round, and reactive to light.  Neck: Normal range of motion. Neck supple.  Cardiovascular: Normal rate and regular rhythm.  Exam reveals no gallop.   No murmur heard. Pulmonary/Chest: Effort normal and breath sounds normal. No stridor. No respiratory distress. She has no wheezes. She has no rales. She exhibits no tenderness.  Abdominal: Soft. Bowel sounds are normal. She exhibits no distension and no mass. There is no tenderness. There is no rebound and no guarding.  Musculoskeletal: Normal range of motion. She exhibits edema. She exhibits no tenderness.  Swelling noted to bilateral upper extremities at the wrist and hands. Distal pulses intact.  Neurological: She is alert and oriented to person, place, and time.  Skin: Skin is warm and dry. Rash noted. No erythema.  Raised erythematous plaques to the patient's extremities and trunk.  Psychiatric: She has a normal mood and affect. Her behavior is normal.  Nursing note and vitals reviewed.   ED Course  Procedures (including critical care time) Labs Review Labs Reviewed - No data to display  Imaging Review No results found.   EKG Interpretation None      MDM   Final diagnoses:  Allergic reaction, initial encounter    Patient percent with allergic reaction but no airway compromise. No known new exposures other than Lomotil which she's been taking for the past  week.  Patient's lip swelling has improved. Continues to have erythematous plaques though they seemed to have decreased in intensity. Improved swelling of bilateral hands. We'll continue to observe.  Pt with continued improvement. Observed in the ED >6 hrs. Given return precautions.  Julianne Rice, MD 05/18/15 [id number]

## 2015-05-18 NOTE — Discharge Instructions (Signed)

## 2015-05-18 NOTE — ED Notes (Signed)
Pt sleeping in bed, in no distress.

## 2015-05-18 NOTE — Patient Instructions (Signed)
Kegel Exercises The goal of Kegel exercises is to isolate and exercise your pelvic floor muscles. These muscles act as a hammock that supports the rectum, vagina, small intestine, and uterus. As the muscles weaken, the hammock sags and these organs are displaced from their normal positions. Kegel exercises can strengthen your pelvic floor muscles and help you to improve bladder and bowel control, improve sexual response, and help reduce many problems and some discomfort during pregnancy. Kegel exercises can be done anywhere and at any time. HOW TO PERFORM KEGEL EXERCISES 1. Locate your pelvic floor muscles. To do this, squeeze (contract) the muscles that you use when you try to stop the flow of urine. You will feel a tightness in the vaginal area (women) and a tight lift in the rectal area (men and women). 2. When you begin, contract your pelvic muscles tight for 2-5 seconds, then relax them for 2-5 seconds. This is one set. Do 4-5 sets with a short pause in between. 3. Contract your pelvic muscles for 8-10 seconds, then relax them for 8-10 seconds. Do 4-5 sets. If you cannot contract your pelvic muscles for 8-10 seconds, try 5-7 seconds and work your way up to 8-10 seconds. Your goal is 4-5 sets of 10 contractions each day. Keep your stomach, buttocks, and legs relaxed during the exercises. Perform sets of both short and long contractions. Vary your positions. Perform these contractions 3-4 times per day. Perform sets while you are:   Lying in bed in the morning.  Standing at lunch.  Sitting in the late afternoon.  Lying in bed at night. You should do 40-50 contractions per day. Do not perform more Kegel exercises per day than recommended. Overexercising can cause muscle fatigue. Continue these exercises for for at least 15-20 weeks or as directed by your caregiver. Document Released: 10/29/2012 Document Reviewed: 10/29/2012 [facility] Patient Information 2015 [place]. This information is  not intended to replace advice given to you by your health care provider. Make sure you discuss any questions you have with your health care provider. Overactive Bladder The bladder has two functions that are totally opposite of the other. One is to relax and stretch out so it can store urine (fills like a balloon), and the other is to contract and squeeze down so that it can empty the urine that it has stored. Proper functioning of the bladder is a complex mixing of these two functions. The filling and emptying of the bladder can be influenced by: 4. The bladder. 5. The spinal cord. 6. The brain. 7. The nerves going to the bladder. 8. Other organs that are closely related to the bladder such as prostate in males and the vagina in females. As your bladder fills with urine, nerve signals are sent from the bladder to the brain to tell you that you may need to urinate. Normal urination requires that the bladder squeeze down with sufficient strength to empty the bladder, but this also requires that the bladder squeeze down sufficiently long to finish the job. In addition the sphincter muscles, which normally keep you from leaking urine, must also relax so that the urine can pass. Coordination between the bladder muscle squeezing down and the sphincter muscles relaxing is required to make everything happen normally. With an overactive bladder sometimes the muscles of the bladder contract unexpectedly and involuntarily and this causes an urgent need to urinate. The normal response is to try to hold urine in by contracting the sphincter muscles. Sometimes the bladder contracts  so strongly that the sphincter muscles cannot stop the urine from passing out and incontinence occurs. This kind of incontinence is called urge incontinence. Having an overactive bladder can be embarrassing and awkward. It can keep you from living life the way you want to. Many people think it is just something you have to put up with as  you grow older or have certain health conditions. In fact, there are treatments that can help make your life easier and more pleasant. CAUSES  Many things can cause an overactive bladder. Possibilities include:  Urinary tract infection or infection of nearby tissues such as the prostate.  Prostate enlargement.  In women, multiple pregnancies or surgery on the uterus or urethra.  Bladder stones, inflammation, or tumors.  Caffeine.  Alcohol.  Medications. For example, diuretics (drugs that help the body get rid of extra fluid) increase urine production. Some other medicines must be taken with lots of fluids.  Muscle or nerve weakness. This might be the result of a spinal cord injury, a stroke, multiple sclerosis, or Parkinson disease.  Diabetes can cause a high urine volume which fills the bladder so quickly that the normal urge to urinate is triggered very strongly. SYMPTOMS   Loss of bladder control. You feel the need to urinate and cannot make your body wait.  Sudden, strong urges to urinate.  Urinating 8 or more times a day.  Waking up to urinate two or more times a night. DIAGNOSIS  To decide if you have overactive bladder, your health care provider will probably:  Ask about symptoms you have noticed.  Ask about your overall health. This will include questions about any medications you are taking.  Do a physical examination. This will help determine if there are obvious blockages or other problems.  Order some tests. These might include:  A blood test to check for diabetes or other health issues that could be contributing to the problem.  Urine testing. This could measure the flow of urine and the pressure on the bladder.  A test of your neurological system (the brain, spinal cord, and nerves). This is the system that senses the need to urinate. Some of these tests are called flow tests, bladder pressure tests, and electrical measurements of the sphincter muscle.  A  bladder test to check whether it is emptying completely when you urinate.  Cystoscopy. This test uses a thin tube with a tiny camera on it. It offers a look inside your urethra and bladder to see if there are problems.  Imaging tests. You might be given a contrast dye and then asked to urinate. X-rays are taken to see how your bladder is working. TREATMENT  An overactive bladder can be treated in many ways. The treatment will depend on the cause. Whether you have a mild or severe case also makes a difference. Often, treatment can be given in your health care provider's office or clinic. Be sure to discuss the different options with your caregiver. They include:  Behavioral treatments. These do not involve medication or surgery:  Bladder training. For this, you would follow a schedule to urinate at regular intervals. This helps you learn to control the urge to urinate. At first, you might be asked to wait a few minutes after feeling the urge. In time, you should be able to schedule bathroom visits an hour or more apart.  Kegel exercises. These exercises strengthen the pelvic floor muscles, which support the bladder. Toning these muscles can help control urination even if  the bladder muscles are overactive. A specialist will teach you how to do these exercises correctly. They will require daily practice.  Weight loss. If you are obese or overweight, losing weight might stop your bladder from being overactive. Talk to your health care provider about how many pounds you should lose. Also ask if there is a specific program or method that would work best for you.  Diet change. This might be suggested if constipation is making your overactive bladder worse. Your health care provider or a nutritionist can explain ways to change what you eat to ease constipation. Other people might need to take in less caffeine or alcohol. Sometimes drinking fewer fluids is needed, too.  Protection. This is not an actual  treatment. But, you could wear special pads to take care of any leakage while you wait for other treatments to take effect. This will help you avoid embarrassment.  Physical treatments.  Electrical stimulation. Electrodes will send gentle pulses to the nerves or muscles that help control the bladder. The goal is to strengthen them. Sometimes this is done with the electrodes outside the body. Or, they might be placed inside the body (implanted). This treatment can take several months to have an effect.  Medications. These are usually used along with other treatments. Several medicines are available. Some are injected into the muscles involved in urination. Others come in pill form. Medications sometimes prescribed include:  Anticholinergics. These drugs block the signals that the nerves deliver to the bladder. This keeps it from releasing urine at the wrong time. Researchers think the drugs might help in other ways, too.  Imipramine. This is an antidepressant. But, it relaxes bladder muscles.  Botox. This is still experimental. Some people believe that injecting it into the bladder muscles will relax them so they work more normally. It has also been injected into the sphincter muscle when the sphincter muscle does not open properly. This is a temporary fix, however. Also, it might make matters worse, especially in older people.  Surgery.  A device might be implanted to help manage your nerves. It works on the nerves that signal when you need to urinate.  Surgery is sometimes needed with electrical stimulation. If the electrodes are implanted, this is done through surgery.  Sometimes repairs need to be made through surgery. For example, the size of the bladder can be changed. This is usually done in severe cases only. HOME CARE INSTRUCTIONS   Take any medications your health care provider prescribed or suggested. Follow the directions carefully.  Practice any lifestyle changes that are  recommended. These might include:  Drinking less fluid or drinking at different times of the day. If you need to urinate often during the night, for example, you may need to stop drinking fluids early in the evening.  Cutting down on caffeine or alcohol. They can both make an overactive bladder worse. Caffeine is found in coffee, tea, and sodas.  Doing Kegel exercises to strengthen muscles.  Losing weight, if that is recommended.  Eating a healthy and balanced diet. This will help you avoid constipation.  Keep a journal or a log. You might be asked to record how much you drink and when, and also when you feel the need to urinate.  Learn how to care for implants or other devices, such as pessaries. SEEK MEDICAL CARE IF:   Your overactive bladder gets worse.  You feel increased pain or irritation when you urinate.  You notice blood in your urine.  You have questions about any medications or devices that your health care provider recommended.  You notice blood, pus, or swelling at the site of any test or treatment procedure.  You have an oral temperature above 102F (38.9C). SEEK IMMEDIATE MEDICAL CARE IF:  You have an oral temperature above 102F (38.9C), not controlled by medicine. Document Released: 09/08/2009 Document Revised: 03/29/2014 Document Reviewed: 09/08/2009 [facility] Patient Information 2015 [place], [place]. This information is not intended to replace advice given to you by your health care provider. Make sure you discuss any questions you have with your health care provider. Urinary Incontinence Urinary incontinence is the involuntary loss of urine from your bladder. CAUSES  There are many causes of urinary incontinence. They include: 9. Medicines. 10. Infections. 11. Prostatic enlargement, leading to overflow of urine from your bladder. 12. Surgery. 13. Neurological diseases. 14. Emotional factors. SIGNS AND SYMPTOMS Urinary Incontinence can be divided into  four types:  Urge incontinence. Urge incontinence is the involuntary loss of urine before you have the opportunity to go to the bathroom. There is a sudden urge to void but not enough time to reach a bathroom.  Stress incontinence. Stress incontinence is the sudden loss of urine with any activity that forces urine to pass. It is commonly caused by anatomical changes to the pelvis and sphincter areas of your body.  Overflow incontinence. Overflow incontinence is the loss of urine from an obstructed opening to your bladder. This results in a backup of urine and a resultant buildup of pressure within the bladder. When the pressure within the bladder exceeds the closing pressure of the sphincter, the urine overflows, which causes incontinence, similar to water overflowing a dam.  Total incontinence. Total incontinence is the loss of urine as a result of the inability to store urine within your bladder. DIAGNOSIS  Evaluating the cause of incontinence may require:  A thorough and complete medical and obstetric history.  A complete physical exam.  Laboratory tests such as a urine culture and sensitivities. When additional tests are indicated, they can include:  An ultrasound exam.  Kidney and bladder X-rays.  Cystoscopy. This is an exam of the bladder using a narrow scope.  Urodynamic testing to test the nerve function to the bladder and sphincter areas. TREATMENT  Treatment for urinary incontinence depends on the cause:  For urge incontinence caused by a bacterial infection, antibiotics will be prescribed. If the urge incontinence is related to medicines you take, your health care provider may have you change the medicine.  For stress incontinence, surgery to re-establish anatomical support to the bladder or sphincter, or both, will often correct the condition.  For overflow incontinence caused by an enlarged prostate, an operation to open the channel through the enlarged prostate will  allow the flow of urine out of the bladder. In women with fibroids, a hysterectomy may be recommended.  For total incontinence, surgery on your urinary sphincter may help. An artificial urinary sphincter (an inflatable cuff placed around the urethra) may be required. In women who have developed a hole-like passage between their bladder and vagina (vesicovaginal fistula), surgery to close the fistula often is required. HOME CARE INSTRUCTIONS  Normal daily hygiene and the use of pads or adult diapers that are changed regularly will help prevent odors and skin damage.  Avoid caffeine. It can overstimulate your bladder.  Use the bathroom regularly. Try about every 2-3 hours to go to the bathroom, even if you do not feel the need to do so. Take  time to empty your bladder completely. After urinating, wait a minute. Then try to urinate again.  For causes involving nerve dysfunction, keep a log of the medicines you take and a journal of the times you go to the bathroom. SEEK MEDICAL CARE IF:  You experience worsening of pain instead of improvement in pain after your procedure.  Your incontinence becomes worse instead of better. SEE IMMEDIATE MEDICAL CARE IF:  You experience fever or shaking chills.  You are unable to pass your urine.  You have redness spreading into your groin or down into your thighs. MAKE SURE YOU:   Understand these instructions.   Will watch your condition.  Will get help right away if you are not doing well or get worse. Document Released: 12/20/2004 Document Revised: 09/02/2013 Document Reviewed: 04/21/2013 [facility] Patient Information 2015 [place], [place]. This information is not intended to replace advice given to you by your health care provider. Make sure you discuss any questions you have with your health care provider.

## 2015-05-18 NOTE — ED Notes (Signed)
Note:  Pt. States her normal sbp is 90-100.

## 2015-05-23 ENCOUNTER — Telehealth: Payer: Self-pay | Admitting: *Deleted

## 2015-05-23 NOTE — Telephone Encounter (Signed)
Pt called and left message to see if C-Diff results from urgent care have been scanned in epic. I called and left message on voicemail no result in system yet.

## 2015-05-24 ENCOUNTER — Encounter (HOSPITAL_COMMUNITY): Payer: Self-pay | Admitting: Emergency Medicine

## 2015-05-24 ENCOUNTER — Inpatient Hospital Stay (HOSPITAL_COMMUNITY)
Admission: EM | Admit: 2015-05-24 | Discharge: 2015-05-26 | DRG: 373 | Disposition: A | Discharge status: Home / Self Care | Payer: BLUE CROSS/BLUE SHIELD | Attending: Internal Medicine | Admitting: Internal Medicine

## 2015-05-24 ENCOUNTER — Emergency Department (HOSPITAL_COMMUNITY): Payer: BLUE CROSS/BLUE SHIELD

## 2015-05-24 DIAGNOSIS — I7300 Raynaud's syndrome without gangrene: Secondary | ICD-10-CM | POA: Diagnosis present

## 2015-05-24 DIAGNOSIS — I959 Hypotension, unspecified: Secondary | ICD-10-CM | POA: Diagnosis present

## 2015-05-24 DIAGNOSIS — E860 Dehydration: Secondary | ICD-10-CM | POA: Diagnosis present

## 2015-05-24 DIAGNOSIS — E785 Hyperlipidemia, unspecified: Secondary | ICD-10-CM

## 2015-05-24 DIAGNOSIS — R739 Hyperglycemia, unspecified: Secondary | ICD-10-CM

## 2015-05-24 DIAGNOSIS — Z79899 Other long term (current) drug therapy: Secondary | ICD-10-CM

## 2015-05-24 DIAGNOSIS — Z86010 Personal history of colonic polyps: Secondary | ICD-10-CM | POA: Diagnosis not present

## 2015-05-24 DIAGNOSIS — E876 Hypokalemia: Secondary | ICD-10-CM | POA: Diagnosis present

## 2015-05-24 DIAGNOSIS — D72829 Elevated white blood cell count, unspecified: Secondary | ICD-10-CM | POA: Insufficient documentation

## 2015-05-24 DIAGNOSIS — I10 Essential (primary) hypertension: Secondary | ICD-10-CM

## 2015-05-24 DIAGNOSIS — R197 Diarrhea, unspecified: Secondary | ICD-10-CM | POA: Diagnosis present

## 2015-05-24 DIAGNOSIS — R109 Unspecified abdominal pain: Secondary | ICD-10-CM

## 2015-05-24 DIAGNOSIS — M1990 Unspecified osteoarthritis, unspecified site: Secondary | ICD-10-CM | POA: Diagnosis present

## 2015-05-24 DIAGNOSIS — E039 Hypothyroidism, unspecified: Secondary | ICD-10-CM | POA: Diagnosis present

## 2015-05-24 DIAGNOSIS — R609 Edema, unspecified: Secondary | ICD-10-CM

## 2015-05-24 DIAGNOSIS — E042 Nontoxic multinodular goiter: Secondary | ICD-10-CM | POA: Diagnosis present

## 2015-05-24 DIAGNOSIS — F329 Major depressive disorder, single episode, unspecified: Secondary | ICD-10-CM | POA: Diagnosis present

## 2015-05-24 DIAGNOSIS — A047 Enterocolitis due to Clostridium difficile: Principal | ICD-10-CM | POA: Diagnosis present

## 2015-05-24 DIAGNOSIS — R000 Tachycardia, unspecified: Secondary | ICD-10-CM | POA: Diagnosis present

## 2015-05-24 DIAGNOSIS — Z8249 Family history of ischemic heart disease and other diseases of the circulatory system: Secondary | ICD-10-CM

## 2015-05-24 DIAGNOSIS — Z808 Family history of malignant neoplasm of other organs or systems: Secondary | ICD-10-CM | POA: Diagnosis not present

## 2015-05-24 DIAGNOSIS — Z791 Long term (current) use of non-steroidal anti-inflammatories (NSAID): Secondary | ICD-10-CM | POA: Diagnosis not present

## 2015-05-24 DIAGNOSIS — N809 Endometriosis, unspecified: Secondary | ICD-10-CM | POA: Diagnosis present

## 2015-05-24 DIAGNOSIS — Z888 Allergy status to other drugs, medicaments and biological substances status: Secondary | ICD-10-CM | POA: Diagnosis not present

## 2015-05-24 DIAGNOSIS — M79643 Pain in unspecified hand: Secondary | ICD-10-CM | POA: Diagnosis present

## 2015-05-24 DIAGNOSIS — Z801 Family history of malignant neoplasm of trachea, bronchus and lung: Secondary | ICD-10-CM | POA: Diagnosis not present

## 2015-05-24 DIAGNOSIS — D649 Anemia, unspecified: Secondary | ICD-10-CM | POA: Diagnosis present

## 2015-05-24 DIAGNOSIS — A0472 Enterocolitis due to Clostridium difficile, not specified as recurrent: Secondary | ICD-10-CM

## 2015-05-24 LAB — CBC WITH DIFFERENTIAL/PLATELET
BASOS ABS: 0 10*3/uL (ref 0.0–0.1)
Basophils Relative: 0 % (ref 0–1)
Eosinophils Absolute: 0.2 10*3/uL (ref 0.0–0.7)
Eosinophils Relative: 2 % (ref 0–5)
HCT: 39 % (ref 36.0–46.0)
HEMOGLOBIN: 12.6 g/dL (ref 12.0–15.0)
LYMPHS ABS: 1.6 10*3/uL (ref 0.7–4.0)
LYMPHS PCT: 10 % — AB (ref 12–46)
MCH: 27 pg (ref 26.0–34.0)
MCHC: 32.3 g/dL (ref 30.0–36.0)
MCV: 83.5 fL (ref 78.0–100.0)
MONOS PCT: 6 % (ref 3–12)
Monocytes Absolute: 1 10*3/uL (ref 0.1–1.0)
NEUTROS ABS: 13 10*3/uL — AB (ref 1.7–7.7)
NEUTROS PCT: 82 % — AB (ref 43–77)
Platelets: 522 10*3/uL — ABNORMAL HIGH (ref 150–400)
RBC: 4.67 MIL/uL (ref 3.87–5.11)
RDW: 13.1 % (ref 11.5–15.5)
WBC: 15.9 10*3/uL — AB (ref 4.0–10.5)

## 2015-05-24 LAB — CREATININE, SERUM
Creatinine, Ser: 0.54 mg/dL (ref 0.44–1.00)
GFR calc Af Amer: >60 mL/min (ref >60)
GFR calc non Af Amer: >60 mL/min (ref >60)

## 2015-05-24 LAB — COMPREHENSIVE METABOLIC PANEL
ALK PHOS: 60 U/L (ref 38–126)
ALT: 25 U/L (ref 14–54)
ANION GAP: 9 (ref 5–15)
AST: 30 U/L (ref 15–41)
Albumin: 2.9 g/dL — ABNORMAL LOW (ref 3.5–5.0)
BILIRUBIN TOTAL: 0.3 mg/dL (ref 0.3–1.2)
BUN: 10 mg/dL (ref 6–20)
CALCIUM: 8.1 mg/dL — AB (ref 8.9–10.3)
CO2: 25 mmol/L (ref 22–32)
Chloride: 100 mmol/L — ABNORMAL LOW (ref 101–111)
Creatinine, Ser: 0.73 mg/dL (ref 0.44–1.00)
GFR calc Af Amer: >60 mL/min (ref >60)
GLUCOSE: 123 mg/dL — AB (ref 65–99)
POTASSIUM: 3.3 mmol/L — AB (ref 3.5–5.1)
SODIUM: 134 mmol/L — AB (ref 135–145)
Total Protein: 5.9 g/dL — ABNORMAL LOW (ref 6.5–8.1)

## 2015-05-24 LAB — CBC
HCT: 35.7 % — ABNORMAL LOW (ref 36.0–46.0)
Hemoglobin: 11.6 g/dL — ABNORMAL LOW (ref 12.0–15.0)
MCH: 27.2 pg (ref 26.0–34.0)
MCHC: 32.5 g/dL (ref 30.0–36.0)
MCV: 83.8 fL (ref 78.0–100.0)
Platelets: 494 10*3/uL — ABNORMAL HIGH (ref 150–400)
RBC: 4.26 MIL/uL (ref 3.87–5.11)
RDW: 13.1 % (ref 11.5–15.5)
WBC: 16.7 10*3/uL — ABNORMAL HIGH (ref 4.0–10.5)

## 2015-05-24 LAB — URINE MICROSCOPIC-ADD ON

## 2015-05-24 LAB — TSH: TSH: 1.497 u[IU]/mL (ref 0.350–4.500)

## 2015-05-24 LAB — URINALYSIS, ROUTINE W REFLEX MICROSCOPIC
Bilirubin Urine: NEGATIVE
Hgb urine dipstick: NEGATIVE
KETONES UR: NEGATIVE mg/dL
Nitrite: NEGATIVE
Protein, ur: NEGATIVE mg/dL
Specific Gravity, Urine: 1.021 (ref 1.005–1.030)
UROBILINOGEN UA: 0.2 mg/dL (ref 0.0–1.0)
pH: 5.5 (ref 5.0–8.0)

## 2015-05-24 LAB — I-STAT CG4 LACTIC ACID, ED: LACTIC ACID, VENOUS: 2.69 mmol/L — AB (ref 0.5–2.0)

## 2015-05-24 LAB — LIPASE, BLOOD: Lipase: 19 U/L — ABNORMAL LOW (ref 22–51)

## 2015-05-24 MED ORDER — ACETAMINOPHEN 325 MG PO TABS
650.0000 mg | ORAL_TABLET | Freq: Four times a day (QID) | ORAL | Status: DC | PRN
  Administered 2015-05-25: 650 mg via ORAL
  Filled 2015-05-24: qty 2

## 2015-05-24 MED ORDER — MORPHINE SULFATE 4 MG/ML IJ SOLN
4.0000 mg | Freq: Once | INTRAMUSCULAR | Status: AC
  Administered 2015-05-24: 4 mg via INTRAVENOUS
  Filled 2015-05-24: qty 1

## 2015-05-24 MED ORDER — SODIUM CHLORIDE 0.9 % IV SOLN
INTRAVENOUS | Status: DC
  Administered 2015-05-24: 19:00:00 via INTRAVENOUS

## 2015-05-24 MED ORDER — METRONIDAZOLE IN NACL 5-0.79 MG/ML-% IV SOLN
500.0000 mg | Freq: Once | INTRAVENOUS | Status: DC
  Administered 2015-05-24: 500 mg via INTRAVENOUS
  Filled 2015-05-24: qty 100

## 2015-05-24 MED ORDER — ACETAMINOPHEN 650 MG RE SUPP: 650.0000 mg | Freq: Four times a day (QID) | RECTAL | Status: DC | PRN

## 2015-05-24 MED ORDER — LORAZEPAM 2 MG/ML IJ SOLN
1.0000 mg | Freq: Once | INTRAMUSCULAR | Status: AC
  Administered 2015-05-24: 1 mg via INTRAVENOUS
  Filled 2015-05-24: qty 1

## 2015-05-24 MED ORDER — ONDANSETRON HCL 4 MG/2ML IJ SOLN
4.0000 mg | Freq: Four times a day (QID) | INTRAMUSCULAR | Status: DC | PRN
Start: 2015-05-24 — End: 2015-05-26
  Administered 2015-05-25: 4 mg via INTRAVENOUS
  Filled 2015-05-24: qty 2

## 2015-05-24 MED ORDER — SODIUM CHLORIDE 0.9 % IV SOLN
INTRAVENOUS | Status: DC
  Administered 2015-05-24: 20:00:00 via INTRAVENOUS

## 2015-05-24 MED ORDER — IOHEXOL 300 MG/ML  SOLN
100.0000 mL | Freq: Once | INTRAMUSCULAR | Status: AC | PRN
  Administered 2015-05-24: 100 mL via INTRAVENOUS

## 2015-05-24 MED ORDER — LORATADINE 10 MG PO TABS
10.0000 mg | ORAL_TABLET | Freq: Every day | ORAL | Status: DC
  Administered 2015-05-24 – 2015-05-26 (×3): 10 mg via ORAL
  Filled 2015-05-24 (×3): qty 1

## 2015-05-24 MED ORDER — HYDROXYZINE HCL 25 MG PO TABS: 25.0000 mg | ORAL_TABLET | Freq: Four times a day (QID) | ORAL | Status: DC | PRN

## 2015-05-24 MED ORDER — AMPHETAMINE-DEXTROAMPHETAMINE 10 MG PO TABS
15.0000 mg | ORAL_TABLET | Freq: Two times a day (BID) | ORAL | Status: DC
  Administered 2015-05-25 – 2015-05-26 (×3): 15 mg via ORAL
  Filled 2015-05-24 (×3): qty 2

## 2015-05-24 MED ORDER — SERTRALINE HCL 25 MG PO TABS
75.0000 mg | ORAL_TABLET | Freq: Every day | ORAL | Status: DC
  Administered 2015-05-24 – 2015-05-26 (×3): 75 mg via ORAL
  Filled 2015-05-24 (×3): qty 1

## 2015-05-24 MED ORDER — LEVONORGESTREL 20 MCG/24HR IU IUD: INTRAUTERINE_SYSTEM | Freq: Once | INTRAUTERINE | Status: DC

## 2015-05-24 MED ORDER — PROGESTERONE MICRONIZED 100 MG PO CAPS
100.0000 mg | ORAL_CAPSULE | Freq: Every day | ORAL | Status: DC
  Administered 2015-05-24 – 2015-05-26 (×3): 100 mg via ORAL
  Filled 2015-05-24 (×3): qty 1

## 2015-05-24 MED ORDER — ONDANSETRON HCL 4 MG PO TABS: 4.0000 mg | ORAL_TABLET | Freq: Four times a day (QID) | ORAL | Status: DC | PRN

## 2015-05-24 MED ORDER — HYDROMORPHONE HCL 1 MG/ML IJ SOLN
1.0000 mg | Freq: Once | INTRAMUSCULAR | Status: AC
  Administered 2015-05-24: 1 mg via INTRAVENOUS
  Filled 2015-05-24: qty 1

## 2015-05-24 MED ORDER — SODIUM CHLORIDE 0.9 % IV BOLUS (SEPSIS)
3000.0000 mL | Freq: Once | INTRAVENOUS | Status: AC
  Administered 2015-05-24: 3000 mL via INTRAVENOUS

## 2015-05-24 MED ORDER — HEPARIN SODIUM (PORCINE) 5000 UNIT/ML IJ SOLN
5000.0000 [IU] | Freq: Three times a day (TID) | INTRAMUSCULAR | Status: DC
  Administered 2015-05-24 – 2015-05-26 (×5): 5000 [IU] via SUBCUTANEOUS
  Filled 2015-05-24 (×8): qty 1

## 2015-05-24 MED ORDER — THYROID 60 MG PO TABS
65.0000 mg | ORAL_TABLET | Freq: Every day | ORAL | Status: DC
  Administered 2015-05-24 – 2015-05-26 (×3): 60 mg via ORAL
  Filled 2015-05-24 (×3): qty 1

## 2015-05-24 MED ORDER — VANCOMYCIN 50 MG/ML ORAL SOLUTION
125.0000 mg | Freq: Four times a day (QID) | ORAL | Status: DC
  Administered 2015-05-24 – 2015-05-26 (×6): 125 mg via ORAL
  Filled 2015-05-24 (×9): qty 2.5

## 2015-05-24 NOTE — ED Provider Notes (Signed)
CSN: [id number]     Arrival date & time 05/24/15  1531 History   First MD Initiated Contact with Patient 05/24/15 1537     Chief Complaint  Patient presents with  . Diarrhea  . Abdominal Cramping     (Consider location/radiation/quality/duration/timing/severity/associated sxs/prior Treatment) HPI Comments: Patient here complaining of diarrhea times several days after being diagnosed with C. difficile. Patient has been taking Flagyl twice a day and has continued to note watery diarrhea along with abdominal discomfort. No fever reported. No vomiting noted. Does note dark urine as well as diffuse whole-body cramping worse in her hands. Also endorses weakness without syncope or near-syncope. Denies any prior history of C. difficile  Patient is a 59 y.o. female presenting with diarrhea and cramps. The history is provided by the patient and the spouse.  Diarrhea Abdominal Cramping    Past Medical History  Diagnosis Date  . Rotator cuff disorder     TEAR-  . Multinodular goiter   . Depression     PMDD  . Colon polyps 2008    BENIGN  . Raynaud's phenomenon   . Hyperlipidemia   . Arrhythmia 2013    PVCs,-monitor  . Palpitations   . Arthritis   . Kidney infection    Past Surgical History  Procedure Laterality Date  . Oophorectomy  2010    LAP RSO  . Rotator cuff repair    . Thyroid lobectomy      RIGHT  . Doppler echocardiography  08/14/2008    EF => 55% , no valvular pathology  . Nm myocar perf wall motion  09/04/2012    EF 66% ,LV normal, exercise capcity 11 mets -completeing stae 3 and 1 minute into stage 4,developed some mild chest pain and throat  tightness caused to stop exercise. [facility] TM scor  2 - MOD RISK  . Event monitor  08/15/2012-08/28/2012    NSR with PVC's ,PSVT vs Aflutter WITH 2:1   Family History  Problem Relation Age of Onset  . Osteoporosis Father   . Hypertension Father   . Lung cancer Father   . Brain cancer Father   . Hypertension Mother   . Heart  attack Mother 71  . Migraines Child    History  Substance Use Topics  . Smoking status: Never Smoker   . Smokeless tobacco: Never Used  . Alcohol Use: Yes     Comment: occ, two glasses of wine a week   OB History    Gravida Para Term Preterm AB TAB SAB Ectopic Multiple Living   2 2 2       2      Review of Systems  Gastrointestinal: Positive for diarrhea.  All other systems reviewed and are negative.     Allergies  Lomotil  Home Medications   Prior to Admission medications   Medication Sig Start Date End Date Taking? Authorizing Provider  amphetamine-dextroamphetamine (ADDERALL) 15 MG tablet take 1 tablet by mouth twice a day AT LEAST 6 HOURS APART 05/04/15   Historical Provider, MD  cetirizine (ZYRTEC) 10 MG tablet Take 10 mg by mouth daily.    Historical Provider, MD  diphenoxylate-atropine (LOMOTIL) 2.5-0.025 MG per tablet Take 2 tablets by mouth 2 (two) times daily. Start 05/10/15 for ten days    Historical Provider, MD  hydrOXYzine (ATARAX/VISTARIL) 25 MG tablet Take 1 tablet (25 mg total) by mouth every 6 (six) hours as needed for itching. 05/18/15   Julianne Rice, MD  ibuprofen (ADVIL,MOTRIN) 800 MG tablet Take  1 tablet (800 mg total) by mouth 3 (three) times daily as needed. 10/20/14   Terrance Mass, MD  metoprolol succinate (TOPROL-XL) 50 MG 24 hr tablet take 1 tablet by mouth once daily 04/14/15   Lorretta Harp, MD  predniSONE (DELTASONE) 20 MG tablet 3 tabs po day one, then 2 po daily x 4 days 05/18/15   Julianne Rice, MD  sertraline (ZOLOFT) 50 MG tablet Take 75 mg by mouth daily.  05/11/15   Historical Provider, MD  thyroid (ARMOUR) 65 MG tablet Take 65 mg by mouth daily.    Historical Provider, MD   LMP 03/18/2015 (Approximate) Physical Exam  Constitutional: She is oriented to person, place, and time. She appears well-developed and well-nourished.  Non-toxic appearance. No distress.  HENT:  Head: Normocephalic and atraumatic.  Mouth/Throat: Mucous membranes  are dry.  Eyes: Conjunctivae, EOM and lids are normal. Pupils are equal, round, and reactive to light.  Neck: Normal range of motion. Neck supple. No tracheal deviation present. No thyroid mass present.  Cardiovascular: Normal rate, regular rhythm and normal heart sounds.  Exam reveals no gallop.   No murmur heard. Pulmonary/Chest: Effort normal and breath sounds normal. No stridor. No respiratory distress. She has no decreased breath sounds. She has no wheezes. She has no rhonchi. She has no rales.  Abdominal: Soft. Normal appearance and bowel sounds are normal. She exhibits distension. There is generalized tenderness. There is no rebound and no CVA tenderness.  Musculoskeletal: Normal range of motion. She exhibits no edema or tenderness.  Neurological: She is alert and oriented to person, place, and time. She has normal strength. No cranial nerve deficit or sensory deficit. GCS eye subscore is 4. GCS verbal subscore is 5. GCS motor subscore is 6.  Skin: Skin is warm and dry. No abrasion and no rash noted.  Psychiatric: She has a normal mood and affect. Her speech is normal and behavior is normal.  Nursing note and vitals reviewed.   ED Course  Procedures (including critical care time) Labs Review Labs Reviewed  CULTURE, BLOOD (ROUTINE X 2)  CULTURE, BLOOD (ROUTINE X 2)  URINE CULTURE  CBC WITH DIFFERENTIAL/PLATELET  COMPREHENSIVE METABOLIC PANEL  LIPASE, BLOOD  URINALYSIS, ROUTINE W REFLEX MICROSCOPIC (NOT AT [facility])  I-STAT CG4 LACTIC ACID, ED    Imaging Review No results found.   EKG Interpretation None      MDM   Final diagnoses:  Abdominal pain    Pt given iv fluids and pain meds, flagyl started and pt to be admitted    Lacretia Leigh, MD 05/24/15 1821

## 2015-05-24 NOTE — ED Notes (Signed)
Bed: GG26 Expected date:  Expected time:  Means of arrival:  Comments: Expected Pt, per Zenia Resides

## 2015-05-24 NOTE — H&P (Signed)
[facility] History and Physical  [name] HMC:[id number] DOB: 18-Aug-1956 DOA: 05/24/2015  Referring physician: Lacretia Leigh, MD PCP: Terrance Mass, MD   Chief Complaint: Diarrhea  HPI: [name] is a 59 y.o. female with history of HTN Hyperlipidemia Hypothyroidism presents with diarrhea. She states that her diarrhea started back in May. She had been at a wedding reception in [facility] where she got a kidney infection. She states that she took antibiotics for 10 days. She then went to the beach where she started to have some issues with her stomach and bowels. Initially she thought it was a stomach virus. Patient states she went twice to an urgent care. Patient states that she started having diarrhea also. She was tested positive for C Diff. She was started on an antidiarrheal lomotil to which she got a reaction. She has been on 5 days of flagyl but has not been feeling any better. Patient states that she comes in now for ongoing issues with the diarrhea. In addition she states that she has had some hand pain since she had the reaction to the lomotil. She has been taking NSAIDs for the pain.   Review of Systems:  Complete ROS performed and is unremarkable other than noted in HPI  Past Medical History  Diagnosis Date  . Rotator cuff disorder     TEAR-  . Multinodular goiter   . Depression     PMDD  . Colon polyps 2008    BENIGN  . Raynaud's phenomenon   . Hyperlipidemia   . Arrhythmia 2013    PVCs,-monitor  . Palpitations   . Arthritis   . Kidney infection    Past Surgical History  Procedure Laterality Date  . Oophorectomy  2010    LAP RSO  . Rotator cuff repair    . Thyroid lobectomy      RIGHT  . Doppler echocardiography  08/14/2008    EF => 55% , no valvular pathology  . Nm myocar perf wall motion  09/04/2012    EF 66% ,LV normal, exercise capcity 11 mets -completeing stae 3 and 1 minute into stage 4,developed some mild chest pain and throat  tightness  caused to stop exercise. [facility] TM scor  2 - MOD RISK  . Event monitor  08/15/2012-08/28/2012    NSR with PVC's ,PSVT vs Aflutter WITH 2:1   Social History:  reports that she has never smoked. She has never used smokeless tobacco. She reports that she drinks alcohol. She reports that she does not use illicit drugs.  Allergies  Allergen Reactions  . Lomotil [Diphenoxylate] Hives    SWELLING, ITCHING     Family History  Problem Relation Age of Onset  . Osteoporosis Father   . Hypertension Father   . Lung cancer Father   . Brain cancer Father   . Hypertension Mother   . Heart attack Mother 71  . Migraines Child      Prior to Admission medications   Medication Sig Start Date End Date Taking? Authorizing Provider  amphetamine-dextroamphetamine (ADDERALL) 15 MG tablet take 1 tablet by mouth twice a day AT LEAST 6 HOURS APART 05/04/15  Yes Historical Provider, MD  cetirizine (ZYRTEC) 10 MG tablet Take 10 mg by mouth daily.   Yes Historical Provider, MD  ibuprofen (ADVIL,MOTRIN) 200 MG tablet Take 400 mg by mouth every 6 (six) hours as needed for moderate pain.   Yes Historical Provider, MD  ibuprofen (ADVIL,MOTRIN) 800 MG tablet Take 1 tablet (800 mg  total) by mouth 3 (three) times daily as needed. Patient taking differently: Take 800 mg by mouth 3 (three) times daily as needed for moderate pain.  10/20/14  Yes Terrance Mass, MD  metoprolol succinate (TOPROL-XL) 50 MG 24 hr tablet take 1 tablet by mouth once daily 04/14/15  Yes Lorretta Harp, MD  metroNIDAZOLE (FLAGYL) 500 MG tablet TAKE 1 TABLET BY MOUTH 2 TIMES DAILY FOR 10 DAYS 05/19/15  Yes Historical Provider, MD  sertraline (ZOLOFT) 50 MG tablet Take 75 mg by mouth daily.  05/11/15  Yes Historical Provider, MD  thyroid (ARMOUR) 65 MG tablet Take 65 mg by mouth daily.   Yes Historical Provider, MD  hydrOXYzine (ATARAX/VISTARIL) 25 MG tablet Take 1 tablet (25 mg total) by mouth every 6 (six) hours as needed for itching. 05/18/15   Julianne Rice, MD  predniSONE (DELTASONE) 20 MG tablet 3 tabs po day one, then 2 po daily x 4 days Patient not taking: Reported on 05/24/2015 05/18/15   Julianne Rice, MD  progesterone (PROMETRIUM) 100 MG capsule TAKE 1 CAPSULE BY MOUTH AT NIGHT 05/18/15   Historical Provider, MD   Physical Exam: Filed Vitals:   05/24/15 1553  BP: 112/53  Pulse: 95  Temp: 97.4 F (36.3 C)  TempSrc: Oral  Resp: 18  SpO2: 96%    Wt Readings from Last 3 Encounters:  05/18/15 63.957 kg (141 lb)  02/23/14 62.188 kg (137 lb 1.6 oz)  02/18/14 61.78 kg (136 lb 3.2 oz)    General:  Appears calm and comfortable Eyes: PERRL, normal lids, irises & conjunctiva ENT: grossly normal hearing, lips & tongue Neck: no LAD, masses or thyromegaly Cardiovascular: RRR, no m/r/g. No LE edema. Respiratory: CTA bilaterally, no w/r/r. Normal respiratory effort. Abdomen: soft, +tenderness non-specific no rebound Skin: no rash or induration seen on limited exam Musculoskeletal: grossly normal tone Psychiatric: grossly normal mood and affect Neurologic: grossly non-focal.          Labs on Admission:  Basic Metabolic Panel:  Recent Labs Lab 05/24/15 1627  NA 134*  K 3.3*  CL 100*  CO2 25  GLUCOSE 123*  BUN 10  CREATININE 0.73  CALCIUM 8.1*   Liver Function Tests:  Recent Labs Lab 05/24/15 1627  AST 30  ALT 25  ALKPHOS 60  BILITOT 0.3  PROT 5.9*  ALBUMIN 2.9*    Recent Labs Lab 05/24/15 1627  LIPASE 19*   No results for input(s): AMMONIA in the last 168 hours. CBC:  Recent Labs Lab 05/24/15 1627  WBC 15.9*  NEUTROABS 13.0*  HGB 12.6  HCT 39.0  MCV 83.5  PLT 522*   Cardiac Enzymes: No results for input(s): CKTOTAL, CKMB, CKMBINDEX, TROPONINI in the last 168 hours.  BNP (last 3 results) No results for input(s): BNP in the last 8760 hours.  ProBNP (last 3 results) No results for input(s): PROBNP in the last 8760 hours.  CBG: No results for input(s): GLUCAP in the last 168  hours.  Radiological Exams on Admission: Ct Abdomen Pelvis W Contrast  05/24/2015   CLINICAL DATA:  Patient reportedly recently diagnosed with Clostridium difficile colitis. Reportedly not taking all of prescribed antibiotics. Currently with abdominal pain.  EXAM: CT ABDOMEN AND PELVIS WITH CONTRAST  TECHNIQUE: Multidetector CT imaging of the abdomen and pelvis was performed using the standard protocol following bolus administration of intravenous contrast. Oral contrast was also administered.  CONTRAST:  19mL OMNIPAQUE IOHEXOL 300 MG/ML  SOLN  COMPARISON:  May 11, 2008  FINDINGS: There is a small left pleural effusion with mild bibasilar atelectatic change.  No focal liver lesions are identified. Gallbladder wall is not appreciably thickened. There is no biliary duct dilatation.  Spleen, pancreas, and adrenals appear normal. Kidneys bilaterally show no mass or hydronephrosis on either side. There is no renal or ureteral calculus on either side. There is an extrarenal pelvis on each side, an anatomic variant.  In the pelvis, the urinary bladder is midline with normal wall thickness. There is an intrauterine device positioned within the endometrium. There is an enhancing lesion in the posterior aspect of the uterus measuring 2.6 x 2.2 x 1.7 cm consistent with a leiomyoma within the uterus. A second presumed leiomyoma which does not show similar enhancement is seen more superiorly in the posterior uterine fundus measuring 2.2 x 2.3 x 1.8 cm.  There is moderate ascites in the pelvis with fluid tracking superiorly to surround the inferior and lateral aspect of the liver and to a lesser extent the spleen.  The appendix appears within normal limits.  There is mild wall thickening throughout the colon diffusely, consistent with the known colitis. There is no evidence of significant surrounding mesenteric thickening. There is no small bowel wall thickening. No bowel obstruction. No free air or portal venous air.   There is no demonstrable adenopathy. There are a few scattered subcentimeter mesenteric lymph nodes which must be regarded as nonspecific. No abscess is appreciable. There is no abdominal aortic aneurysm. There are no blastic or lytic bone lesions.  IMPRESSION: There is relatively mild generalized colonic wall thickening consistent with a degree of residual colitis. No bowel obstruction. No free air. No periappendiceal region inflammation.  There is moderate ascites, primarily in the pelvis. Etiology for the ascites is uncertain. It may be secondary to the colitis. An ovarian cyst rupture could also be a cause of ascites of this nature.  Apparent uterine leiomyomas. Intrauterine device present within the endometrium.  Small left pleural effusion with mild bibasilar atelectatic change.   Electronically Signed   By: Lowella Grip III M.D.   On: 05/24/2015 17:53      Assessment/Plan Principal Problem:   C. difficile colitis Active Problems:   HTN (hypertension)   Hyperlipidemia   Hypokalemia   Hyperglycemia   1. C Diff Colitis Diarrhea -will be admitted for hydration -started on Vancocin orally -will monitor potassium and replace as needed  2. H/o tachycardia -she is on metoprolol -likely worsened by dehydration  3. Hypokalemia -will replace as needed  4. Hyperglycemia -will monitor FSBS -SSI as needed  5. Hypothyroid -check TSH -continue with Armour thyroid  Code Status: Full Code (must indicate code status--if unknown or must be presumed, indicate so) DVT Prophylaxis:Heparin Family Communication: None (indicate person spoken with, if applicable, with phone number if by telephone) Disposition Plan: Home (indicate anticipated LOS)  Time spent: 78min  KHAN,SAADAT A [facility] Pager [phone]

## 2015-05-24 NOTE — ED Notes (Signed)
MD at bedside. 

## 2015-05-24 NOTE — ED Notes (Signed)
Pt states that she was recently dx c-diff and had been taking antibiotics but was under medicating. Started taking appropriate dose of Flagyl today but is having cramping in arms, shoulders and back. Dark urine. Alert and oriented.

## 2015-05-24 NOTE — ED Notes (Signed)
Dr Zenia Resides made aware of abnormal lactic.

## 2015-05-25 ENCOUNTER — Encounter (HOSPITAL_COMMUNITY): Payer: Self-pay | Admitting: General Practice

## 2015-05-25 DIAGNOSIS — I10 Essential (primary) hypertension: Secondary | ICD-10-CM

## 2015-05-25 DIAGNOSIS — A047 Enterocolitis due to Clostridium difficile: Principal | ICD-10-CM

## 2015-05-25 DIAGNOSIS — E876 Hypokalemia: Secondary | ICD-10-CM

## 2015-05-25 DIAGNOSIS — D72829 Elevated white blood cell count, unspecified: Secondary | ICD-10-CM

## 2015-05-25 LAB — COMPREHENSIVE METABOLIC PANEL
ALT: 19 U/L (ref 14–54)
AST: 19 U/L (ref 15–41)
Albumin: 2.3 g/dL — ABNORMAL LOW (ref 3.5–5.0)
Alkaline Phosphatase: 46 U/L (ref 38–126)
Anion gap: 7 (ref 5–15)
BUN: 6 mg/dL (ref 6–20)
CALCIUM: 7.4 mg/dL — AB (ref 8.9–10.3)
CO2: 21 mmol/L — ABNORMAL LOW (ref 22–32)
CREATININE: 0.46 mg/dL (ref 0.44–1.00)
Chloride: 106 mmol/L (ref 101–111)
GFR calc Af Amer: >60 mL/min (ref >60)
GLUCOSE: 118 mg/dL — AB (ref 65–99)
Potassium: 3.5 mmol/L (ref 3.5–5.1)
Sodium: 134 mmol/L — ABNORMAL LOW (ref 135–145)
Total Bilirubin: 0.5 mg/dL (ref 0.3–1.2)
Total Protein: 4.7 g/dL — ABNORMAL LOW (ref 6.5–8.1)

## 2015-05-25 LAB — CBC
HEMATOCRIT: 32.1 % — AB (ref 36.0–46.0)
HEMOGLOBIN: 10.6 g/dL — AB (ref 12.0–15.0)
MCH: 27.9 pg (ref 26.0–34.0)
MCHC: 33 g/dL (ref 30.0–36.0)
MCV: 84.5 fL (ref 78.0–100.0)
Platelets: 501 10*3/uL — ABNORMAL HIGH (ref 150–400)
RBC: 3.8 MIL/uL — AB (ref 3.87–5.11)
RDW: 13.2 % (ref 11.5–15.5)
WBC: 19.4 10*3/uL — ABNORMAL HIGH (ref 4.0–10.5)

## 2015-05-25 LAB — CLOSTRIDIUM DIFFICILE BY PCR: Toxigenic C. Difficile by PCR: POSITIVE — AB

## 2015-05-25 LAB — GLUCOSE, CAPILLARY: Glucose-Capillary: 85 mg/dL (ref 65–99)

## 2015-05-25 MED ORDER — HYDROMORPHONE HCL 1 MG/ML IJ SOLN
1.0000 mg | Freq: Once | INTRAMUSCULAR | Status: AC
  Administered 2015-05-25: 1 mg via INTRAVENOUS
  Filled 2015-05-25: qty 1

## 2015-05-25 MED ORDER — OXYCODONE-ACETAMINOPHEN 5-325 MG PO TABS
1.0000 | ORAL_TABLET | Freq: Four times a day (QID) | ORAL | Status: DC | PRN
  Administered 2015-05-25 – 2015-05-26 (×3): 2 via ORAL
  Filled 2015-05-25 (×3): qty 2

## 2015-05-25 MED ORDER — METOPROLOL SUCCINATE ER 25 MG PO TB24
25.0000 mg | ORAL_TABLET | Freq: Every day | ORAL | Status: DC
  Administered 2015-05-25 – 2015-05-26 (×2): 25 mg via ORAL
  Filled 2015-05-25 (×2): qty 1

## 2015-05-25 MED ORDER — KETOROLAC TROMETHAMINE 30 MG/ML IJ SOLN
30.0000 mg | Freq: Four times a day (QID) | INTRAMUSCULAR | Status: DC | PRN
  Administered 2015-05-25 – 2015-05-26 (×3): 30 mg via INTRAVENOUS
  Filled 2015-05-25 (×3): qty 1

## 2015-05-25 NOTE — Progress Notes (Signed)
Patient complains of heart palpitations. States she takes metoprolol prescribed by a cardiologist for this. Per note, metoprolol being held due to hypotension. Patient states her BP is always low. EKG obtained. Night coverage notified.

## 2015-05-25 NOTE — Care Management Note (Signed)
Case Management Note  Patient Details  Name: [name] MRN: [id number] Date of Birth: 06-15-1956  Subjective/Objective:   59 y/o f admitted w/diarrhea. C diff. From home.                 Action/Plan:d/c plan home. No anticipated d/c needs.   Expected Discharge Date:   (Unknown)               Expected Discharge Plan:  Home/Self Care  In-House Referral:     Discharge planning Services  CM Consult  Post Acute Care Choice:    Choice offered to:     DME Arranged:    DME Agency:     HH Arranged:    HH Agency:     Status of Service:  In process, will continue to follow  Medicare Important Message Given:    Date Medicare IM Given:    Medicare IM give by:    Date Additional Medicare IM Given:    Additional Medicare Important Message give by:     If discussed at [place] of Stay Meetings, dates discussed:    Additional Comments:  Dessa Phi, RN 05/25/2015, 2:49 PM

## 2015-05-25 NOTE — Progress Notes (Signed)
Initial Nutrition Assessment  DOCUMENTATION CODES:  Not applicable  INTERVENTION: - RD will continue to monitor for needs  NUTRITION DIAGNOSIS:  Inadequate oral intake related to acute illness as evidenced by per patient/family report, meal completion < 50%.  GOAL:  Patient will meet greater than or equal to 90% of their needs  MONITOR:  PO intake, Weight trends, Labs, I & O's  REASON FOR ASSESSMENT:  Malnutrition Screening Tool  ASSESSMENT: 59 y.o. female with history of HTN Hyperlipidemia Hypothyroidism presents with diarrhea. She states that her diarrhea started back in May. She had been at a wedding reception in [facility] where she got a kidney infection. She states that she took antibiotics for 10 days. She then went to the beach where she started to have some issues with her stomach and bowels. Initially she thought it was a stomach virus. Patient states she went twice to an urgent care. Patient states that she started having diarrhea also. She was tested positive for C Diff. She was started on an antidiarrheal Lomotil to which she got a reaction. She has been on 5 days of Flagyl but has not been feeling any better.  Pt seen for MST. BMI indicates normal weight status. Pt reports decreased appetite over the past few weeks with diarrhea and nausea. She states she was able to eat ~50% of breakfast this AM and is planning to order lunch soon. She indicates that she has had bilateral hand swelling and that gripping items is difficult; talked with her about ordering items such as sandwiches or soup that she can drink out of the bowl to help with this.   She indicates that she has been eating a bland diet yesterday and today which has seemed to help. Encouraged her to continue this with current dx and to limit fried foods and spicy items.   Pt states that despite ongoing diarrhea she has had very minimal weight loss. She attributes this to need for thyroid hormone and that she had her  thyroid radiated in the past. Per weight hx review, pt has gained 3 lbs in the past 6 days which is likely fluid retention.  Variably meeting needs. Will monitor for needs for supplements at follow-up. No muscle or fat wasting present. Medications reviewed. Labs reviewed; Ca: 7.4 mg/dL.  Height:  Ht Readings from Last 1 Encounters:  05/24/15 5\' 4"  (1.626 m)    Weight:  Wt Readings from Last 1 Encounters:  05/24/15 144 lb 11.2 oz (65.635 kg)    Ideal Body Weight:  54.54 kg (kg)  Wt Readings from Last 10 Encounters:  05/24/15 144 lb 11.2 oz (65.635 kg)  05/18/15 141 lb (63.957 kg)  02/23/14 137 lb 1.6 oz (62.188 kg)  02/18/14 136 lb 3.2 oz (61.78 kg)  08/21/13 145 lb (65.772 kg)  01/23/13 138 lb (62.596 kg)  06/03/12 135 lb (61.236 kg)  01/22/12 136 lb (61.689 kg)    BMI:  Body mass index is 24.83 kg/(m^2).  Estimated Nutritional Needs:  Kcal:  1600-1800  Protein:  60-70 grams  Fluid:  >2.5 L/day  Skin:  Reviewed, no issues  Diet Order:  Diet heart healthy/carb modified Room service appropriate?: Yes; Fluid consistency:: Thin  EDUCATION NEEDS:  No education needs identified at this time   Intake/Output Summary (Last 24 hours) at 05/25/15 1229 Last data filed at 05/25/15 0950  Gross per 24 hour  Intake   1030 ml  Output      6 ml  Net   1024  ml    Last BM:  6/29    Jarome Matin, RD, LDN Inpatient Clinical Dietitian Pager # [phone] After hours/weekend pager # [phone]

## 2015-05-25 NOTE — Progress Notes (Signed)
Patient ID: [name], female   DOB: 01-04-1956, 59 y.o.   MRN: [id number] [facility] PROGRESS NOTE  ISEBELLA UPSHUR GLO:[id number] DOB: 1956-03-14 DOA: 05/24/2015 PCP: Terrance Mass, MD  Brief narrative:    59 y.o. Female with past medical history of endometriosis, multinodular goiter, recent kidney infection and as a result may have developed C.diff. She was given flagyl in ED but due to leukocytosis her abx were changed to vanco PO.  Assessment/Plan:    Principal Problem:   C. difficile colitis / Leukocytosis - Was previously on flagyl - Stool for C.diff positive - Now on PO vancomycin - Continue to provide supportive care with low rate IV fluids until po intake better  - Please note, pt was on prednisone pack dated on 05/18/15 so leukocytosis could also be due to prednisone   Active Problems:   HTN (hypertension), essential - Metoprolol on hold due to hypotension     Normocytic anemia - Hemoglobin initially WNL but now down to 10.6 - No reports of bleeding - Will continue to monitor CBC    Hypokalemia - Due to GI losses - Supplemented and WNL    Multinodular goiter - Pt on thyroid supplementation - TSH WNL on this admission    DVT Prophylaxis  - Heparin subQ ordered.    Code Status: Full.  Family Communication:  plan of care discussed with the patient Disposition Plan: Home once leukocytosis improves. I have reviewed patient's most recent office visits, hospitalizations, blood work and imagine/diagnostic studies as available in [employer].  IV access:  Peripheral IV  Procedures and diagnostic studies:    Ct Abdomen Pelvis W Contrast 05/24/2015  There is relatively mild generalized colonic wall thickening consistent with a degree of residual colitis. No bowel obstruction. No free air. No periappendiceal region inflammation.  There is moderate ascites, primarily in the pelvis. Etiology for the ascites is uncertain. It may be secondary to the colitis. An  ovarian cyst rupture could also be a cause of ascites of this nature.  Apparent uterine leiomyomas. Intrauterine device present within the endometrium.  Small left pleural effusion with mild bibasilar atelectatic change.     Medical Consultants:  None   Other Consultants:  None   IAnti-Infectives:   Vanco PO 05/24/2015 -->   DEVINE, ALMA, MD  [facility] Pager [phone]  Time spent in minutes: 25 minutes  If 7PM-7AM, please contact night-coverage [email or web address] Password [facility] 05/25/2015, 10:37 AM   LOS: 1 day    HPI/Subjective: No acute overnight events. Patient reports pain in hands.  Objective: Filed Vitals:   05/24/15 1949 05/24/15 1950 05/24/15 1952 05/25/15 0605  BP: 113/51 117/61 112/54 109/53  Pulse: 82 88 94 79  Temp:    97.9 F (36.6 C)  TempSrc:    Oral  Resp:    18  Height:      Weight:      SpO2:    98%    Intake/Output Summary (Last 24 hours) at 05/25/15 1037 Last data filed at 05/25/15 0950  Gross per 24 hour  Intake   1030 ml  Output      6 ml  Net   1024 ml    Exam:   General:  Pt is alert, follows commands appropriately, not in acute distress  Cardiovascular: Regular rate and rhythm, S1/S2, no murmurs  Respiratory: Clear to auscultation bilaterally, no wheezing, no crackles, no rhonchi  Abdomen: Soft, non tender, non distended, bowel sounds present  Extremities: No edema, pulses DP  and PT palpable bilaterally  Neuro: Grossly nonfocal  Data Reviewed: Basic Metabolic Panel:  Recent Labs Lab 05/24/15 1627 05/24/15 2041 05/25/15 0520  NA 134*  --  134*  K 3.3*  --  3.5  CL 100*  --  106  CO2 25  --  21*  GLUCOSE 123*  --  118*  BUN 10  --  6  CREATININE 0.73 0.54 0.46  CALCIUM 8.1*  --  7.4*   Liver Function Tests:  Recent Labs Lab 05/24/15 1627 05/25/15 0520  AST 30 19  ALT 25 19  ALKPHOS 60 46  BILITOT 0.3 0.5  PROT 5.9* 4.7*  ALBUMIN 2.9* 2.3*    Recent Labs Lab 05/24/15 1627  LIPASE 19*   No  results for input(s): AMMONIA in the last 168 hours. CBC:  Recent Labs Lab 05/24/15 1627 05/24/15 2041 05/25/15 0520  WBC 15.9* 16.7* 19.4*  NEUTROABS 13.0*  --   --   HGB 12.6 11.6* 10.6*  HCT 39.0 35.7* 32.1*  MCV 83.5 83.8 84.5  PLT 522* 494* 501*   Cardiac Enzymes: No results for input(s): CKTOTAL, CKMB, CKMBINDEX, TROPONINI in the last 168 hours. BNP: Invalid input(s): POCBNP CBG:  Recent Labs Lab 05/25/15 0750  GLUCAP 85    Recent Results (from the past 240 hour(s))  Clostridium Difficile by PCR (not at [facility])     Status: Abnormal   Collection Time: 05/25/15  2:49 AM  Result Value Ref Range Status   C difficile by pcr POSITIVE (A) NEGATIVE Final     Scheduled Meds: . amphetamine-dextroamphetamine  15 mg Oral BID WC  . heparin  5,000 Units Subcutaneous 3 times per day  . loratadine  10 mg Oral Daily  . progesterone  100 mg Oral Daily  . sertraline  75 mg Oral Daily  . thyroid  60 mg Oral Daily  . vancomycin  125 mg Oral QID   Continuous Infusions: . sodium chloride 75 mL/hr at 05/24/15 2028

## 2015-05-26 ENCOUNTER — Inpatient Hospital Stay (HOSPITAL_COMMUNITY): Payer: BLUE CROSS/BLUE SHIELD

## 2015-05-26 DIAGNOSIS — R609 Edema, unspecified: Secondary | ICD-10-CM

## 2015-05-26 DIAGNOSIS — D72829 Elevated white blood cell count, unspecified: Secondary | ICD-10-CM | POA: Insufficient documentation

## 2015-05-26 LAB — URINE CULTURE
Culture: 5000
Special Requests: NORMAL

## 2015-05-26 LAB — GLUCOSE, CAPILLARY: Glucose-Capillary: 90 mg/dL (ref 65–99)

## 2015-05-26 LAB — HEMOGLOBIN A1C
Hgb A1c MFr Bld: 6.1 % — ABNORMAL HIGH (ref 4.8–5.6)
Mean Plasma Glucose: 128 mg/dL

## 2015-05-26 MED ORDER — OXYCODONE-ACETAMINOPHEN 5-325 MG PO TABS: 1.0000 | ORAL_TABLET | Freq: Two times a day (BID) | ORAL | Status: DC | PRN

## 2015-05-26 MED ORDER — FUROSEMIDE 40 MG PO TABS: 40.0000 mg | ORAL_TABLET | Freq: Every day | ORAL | Status: DC

## 2015-05-26 MED ORDER — VANCOMYCIN 50 MG/ML ORAL SOLUTION: 125.0000 mg | Freq: Four times a day (QID) | ORAL | Status: DC

## 2015-05-26 MED ORDER — FUROSEMIDE 40 MG PO TABS
40.0000 mg | ORAL_TABLET | Freq: Once | ORAL | Status: AC
  Administered 2015-05-26: 40 mg via ORAL
  Filled 2015-05-26: qty 1

## 2015-05-26 NOTE — Care Management Note (Signed)
Case Management Note  Patient Details  Name: NASHLEY CORDOBA MRN: [id number] Date of Birth: 09-Nov-1956  Subjective/Objective:                    Action/Plan:d/c home no needs or orders.   Expected Discharge Date:   (Unknown)               Expected Discharge Plan:  Home/Self Care  In-House Referral:     Discharge planning Services  CM Consult  Post Acute Care Choice:    Choice offered to:     DME Arranged:    DME Agency:     HH Arranged:    [place] Agency:     Status of Service:  Completed, signed off  Medicare Important Message Given:    Date Medicare IM Given:    Medicare IM give by:    Date Additional Medicare IM Given:    Additional Medicare Important Message give by:     If discussed at [place] of Stay Meetings, dates discussed:    Additional Comments:  Dessa Phi, RN 05/26/2015, 10:49 AM

## 2015-05-26 NOTE — Discharge Summary (Signed)
Physician Discharge Summary  [name] OEV:[id number] DOB: 1956/04/05 DOA: 05/24/2015  PCP: Terrance Mass, MD  Admit date: 05/24/2015 Discharge date: 05/26/2015  Recommendations for Outpatient Follow-up:  1. Continue vancomycin for 13 days on discharge for C. difficile colitis.  Discharge Diagnoses:  Principal Problem:   C. difficile colitis Active Problems:   HTN (hypertension)   Hyperlipidemia   Hypokalemia   Hyperglycemia    Discharge Condition: stable   Diet recommendation: as tolerated   History of present illness:  59 y.o. Female with past medical history of endometriosis, multinodular goiter, recent kidney infection and as a result may have developed C.diff. She was given flagyl in ED but due to leukocytosis her abx were changed to vanco PO.  Hospital Course:   Assessment/Plan:    Principal Problem:  C. difficile colitis / Leukocytosis - Was previously on flagyl - Stool for C.diff positive on this admission - Continue vancomycin PO as prescribed for 13 days  - Please note, pt was on prednisone pack dated on 05/18/15 so leukocytosis could also be due to prednisone   Active Problems:  HTN (hypertension), essential - May continue metoprolol on discharge    Normocytic anemia - Hemoglobin initially WNL but now down to 10.6 - No reports of bleeding - Hemoglobin stable    Hypokalemia - Due to GI losses - Supplemented and WNL   Multinodular goiter - Pt on thyroid supplementation - TSH WNL on this admission    DVT Prophylaxis  - Heparin subQ ordered while pt in hospital    Code Status: Full.  Family Communication: plan of care discussed with the patient   IV access:  Peripheral IV  Procedures and diagnostic studies:   Ct Abdomen Pelvis W Contrast 05/24/2015 There is relatively mild generalized colonic wall thickening consistent with a degree of residual colitis. No bowel obstruction. No free air. No periappendiceal region  inflammation. There is moderate ascites, primarily in the pelvis. Etiology for the ascites is uncertain. It may be secondary to the colitis. An ovarian cyst rupture could also be a cause of ascites of this nature. Apparent uterine leiomyomas. Intrauterine device present within the endometrium. Small left pleural effusion with mild bibasilar atelectatic change.   Medical Consultants:  None   Other Consultants:  None   IAnti-Infectives:   Vanco PO 05/24/2015 --> for 13 days on discharge      Signed:  Leisa Lenz, MD  [facility] 05/26/2015, 9:59 AM  Pager #: [phone]  Time spent in minutes: more than 30 minutes   Discharge Exam: Filed Vitals:   05/26/15 0650  BP: 107/49  Pulse: 83  Temp: 98.1 F (36.7 C)  Resp: 18   Filed Vitals:   05/25/15 0605 05/25/15 1431 05/25/15 2130 05/26/15 0650  BP: 109/53 121/58 118/57 107/49  Pulse: 79 103 100 83  Temp: 97.9 F (36.6 C) 98.2 F (36.8 C) 98.4 F (36.9 C) 98.1 F (36.7 C)  TempSrc: Oral Oral Oral Oral  Resp: 18 18 18 18   Height:      Weight:      SpO2: 98% 99% 99% 97%    General: Pt is alert, follows commands appropriately, not in acute distress Cardiovascular: Regular rate and rhythm, S1/S2 +, no murmurs Respiratory: Clear to auscultation bilaterally, no wheezing, no crackles, no rhonchi Abdominal: Soft, non tender, non distended, bowel sounds +, no guarding Extremities: no edema, no cyanosis, pulses palpable bilaterally DP and PT Neuro: Grossly nonfocal  Discharge Instructions  Discharge Instructions  Call MD for:  difficulty breathing, headache or visual disturbances    Complete by:  As directed      Call MD for:  persistant nausea and vomiting    Complete by:  As directed      Call MD for:  severe uncontrolled pain    Complete by:  As directed      Diet - low sodium heart healthy    Complete by:  As directed      Discharge instructions    Complete by:  As directed   1. Continue  vancomycin for 13 days on discharge for C. difficile colitis.     Increase activity slowly    Complete by:  As directed             Medication List    STOP taking these medications        metroNIDAZOLE 500 MG tablet  Commonly known as:  FLAGYL     predniSONE 20 MG tablet  Commonly known as:  DELTASONE      TAKE these medications        amphetamine-dextroamphetamine 15 MG tablet  Commonly known as:  ADDERALL  take 1 tablet by mouth twice a day AT LEAST 6 HOURS APART     cetirizine 10 MG tablet  Commonly known as:  ZYRTEC  Take 10 mg by mouth daily.     hydrOXYzine 25 MG tablet  Commonly known as:  ATARAX/VISTARIL  Take 1 tablet (25 mg total) by mouth every 6 (six) hours as needed for itching.     ibuprofen 200 MG tablet  Commonly known as:  ADVIL,MOTRIN  Take 400 mg by mouth every 6 (six) hours as needed for moderate pain.     metoprolol succinate 50 MG 24 hr tablet  Commonly known as:  TOPROL-XL  take 1 tablet by mouth once daily     oxyCODONE-acetaminophen 5-325 MG per tablet  Commonly known as:  PERCOCET/ROXICET  Take 1 tablet by mouth every 12 (twelve) hours as needed for moderate pain or severe pain.     progesterone 100 MG capsule  Commonly known as:  PROMETRIUM  TAKE 1 CAPSULE BY MOUTH AT NIGHT     sertraline 50 MG tablet  Commonly known as:  ZOLOFT  Take 75 mg by mouth daily.     thyroid 65 MG tablet  Commonly known as:  ARMOUR  Take 65 mg by mouth daily.     vancomycin 50 mg/mL oral solution  Commonly known as:  VANCOCIN  Take 2.5 mLs (125 mg total) by mouth 4 (four) times daily.           Follow-up Information    Follow up with Juanita Craver, MD On 06/02/2015.   Specialty:  Gastroenterology   Why:  Please follow up with Dr. Collene Mares on Thursday, July 7th at 11:00am   Contact information:   [address], [place]  [address] [phone]        The results of significant diagnostics from this hospitalization (including  imaging, microbiology, ancillary and laboratory) are listed below for reference.    Significant Diagnostic Studies: Ct Abdomen Pelvis W Contrast  05/24/2015   CLINICAL DATA:  Patient reportedly recently diagnosed with Clostridium difficile colitis. Reportedly not taking all of prescribed antibiotics. Currently with abdominal pain.  EXAM: CT ABDOMEN AND PELVIS WITH CONTRAST  TECHNIQUE: Multidetector CT imaging of the abdomen and pelvis was performed using the standard protocol following bolus administration of intravenous contrast. Oral contrast  was also administered.  CONTRAST:  152mL OMNIPAQUE IOHEXOL 300 MG/ML  SOLN  COMPARISON:  May 11, 2008  FINDINGS: There is a small left pleural effusion with mild bibasilar atelectatic change.  No focal liver lesions are identified. Gallbladder wall is not appreciably thickened. There is no biliary duct dilatation.  Spleen, pancreas, and adrenals appear normal. Kidneys bilaterally show no mass or hydronephrosis on either side. There is no renal or ureteral calculus on either side. There is an extrarenal pelvis on each side, an anatomic variant.  In the pelvis, the urinary bladder is midline with normal wall thickness. There is an intrauterine device positioned within the endometrium. There is an enhancing lesion in the posterior aspect of the uterus measuring 2.6 x 2.2 x 1.7 cm consistent with a leiomyoma within the uterus. A second presumed leiomyoma which does not show similar enhancement is seen more superiorly in the posterior uterine fundus measuring 2.2 x 2.3 x 1.8 cm.  There is moderate ascites in the pelvis with fluid tracking superiorly to surround the inferior and lateral aspect of the liver and to a lesser extent the spleen.  The appendix appears within normal limits.  There is mild wall thickening throughout the colon diffusely, consistent with the known colitis. There is no evidence of significant surrounding mesenteric thickening. There is no small bowel  wall thickening. No bowel obstruction. No free air or portal venous air.  There is no demonstrable adenopathy. There are a few scattered subcentimeter mesenteric lymph nodes which must be regarded as nonspecific. No abscess is appreciable. There is no abdominal aortic aneurysm. There are no blastic or lytic bone lesions.  IMPRESSION: There is relatively mild generalized colonic wall thickening consistent with a degree of residual colitis. No bowel obstruction. No free air. No periappendiceal region inflammation.  There is moderate ascites, primarily in the pelvis. Etiology for the ascites is uncertain. It may be secondary to the colitis. An ovarian cyst rupture could also be a cause of ascites of this nature.  Apparent uterine leiomyomas. Intrauterine device present within the endometrium.  Small left pleural effusion with mild bibasilar atelectatic change.   Electronically Signed   By: Lowella Grip III M.D.   On: 05/24/2015 17:53    Microbiology: Recent Results (from the past 240 hour(s))  Culture, blood (routine x 2)     Status: None (Preliminary result)   Collection Time: 05/24/15  4:20 PM  Result Value Ref Range Status   Specimen Description BLOOD RIGHT ANTECUBITAL  Final   Special Requests BOTTLES DRAWN AEROBIC AND ANAEROBIC 5ML  Final   Culture   Final    NO GROWTH < 24 HOURS Performed at [facility]    Report Status PENDING  Incomplete  Urine culture     Status: None (Preliminary result)   Collection Time: 05/24/15  4:36 PM  Result Value Ref Range Status   Specimen Description URINE, CATHETERIZED  Final   Special Requests Normal  Final   Culture   Final    NO GROWTH < 24 HOURS Performed at [facility]    Report Status PENDING  Incomplete  Culture, blood (routine x 2)     Status: None (Preliminary result)   Collection Time: 05/24/15  4:41 PM  Result Value Ref Range Status   Specimen Description BLOOD LEFT ANTECUBITAL  Final   Special Requests BOTTLES DRAWN  AEROBIC AND ANAEROBIC 5 CC EA  Final   Culture   Final    NO GROWTH < 24  HOURS Performed at [facility]    Report Status PENDING  Incomplete  Clostridium Difficile by PCR (not at [facility])     Status: Abnormal   Collection Time: 05/25/15  2:49 AM  Result Value Ref Range Status   C difficile by pcr POSITIVE (A) NEGATIVE Final    Comment: CRITICAL RESULT CALLED TO, READ BACK BY AND VERIFIED WITH: Q. CORE RN AT 0515 ON 06.29.16 BY SHUEA      Labs: Basic Metabolic Panel:  Recent Labs Lab 05/24/15 1627 05/24/15 2041 05/25/15 0520  NA 134*  --  134*  K 3.3*  --  3.5  CL 100*  --  106  CO2 25  --  21*  GLUCOSE 123*  --  118*  BUN 10  --  6  CREATININE 0.73 0.54 0.46  CALCIUM 8.1*  --  7.4*   Liver Function Tests:  Recent Labs Lab 05/24/15 1627 05/25/15 0520  AST 30 19  ALT 25 19  ALKPHOS 60 46  BILITOT 0.3 0.5  PROT 5.9* 4.7*  ALBUMIN 2.9* 2.3*    Recent Labs Lab 05/24/15 1627  LIPASE 19*   No results for input(s): AMMONIA in the last 168 hours. CBC:  Recent Labs Lab 05/24/15 1627 05/24/15 2041 05/25/15 0520  WBC 15.9* 16.7* 19.4*  NEUTROABS 13.0*  --   --   HGB 12.6 11.6* 10.6*  HCT 39.0 35.7* 32.1*  MCV 83.5 83.8 84.5  PLT 522* 494* 501*   Cardiac Enzymes: No results for input(s): CKTOTAL, CKMB, CKMBINDEX, TROPONINI in the last 168 hours. BNP: BNP (last 3 results) No results for input(s): BNP in the last 8760 hours.  ProBNP (last 3 results) No results for input(s): PROBNP in the last 8760 hours.  CBG:  Recent Labs Lab 05/25/15 0750 05/26/15 0749  GLUCAP 85 90

## 2015-05-26 NOTE — Progress Notes (Signed)
VASCULAR LAB PRELIMINARY  PRELIMINARY  PRELIMINARY  PRELIMINARY  Bilateral lower extremity venous duplex  completed.    Preliminary report:  Bilateral:  No evidence of DVT, superficial thrombosis, or Baker's Cyst.    [name], RVT 05/26/2015, 10:18 AM

## 2015-05-26 NOTE — Progress Notes (Signed)
Patient was discharged home with instructions given on medications,and follow up visits,patient verbalized understanding. Prescriptions sent with patient.. Accompanied by staff to an awaiting vehicle.

## 2015-05-26 NOTE — Discharge Instructions (Signed)
Clostridium Difficile Infection °Clostridium difficile (C. difficile) is a bacteria found in the intestinal tract or colon. Under certain conditions, it causes diarrhea and sometimes severe disease. The severe form of the disease is known as pseudomembranous colitis (often called C. difficile colitis). This disease can damage the lining of the colon or cause the colon to become enlarged (toxic megacolon). °CAUSES °Your colon normally contains many different bacteria, including C. difficile. The balance of bacteria in your colon can change during illness. This is especially true when you take antibiotic medicine. Taking antibiotics may allow the C. difficile to grow, multiply excessively, and make a toxin that then causes illness. The elderly and people with certain medical conditions have a greater risk of getting C. difficile infections. °SYMPTOMS °· Watery diarrhea. °· Fever. °· Fatigue. °· Loss of appetite. °· Nausea. °· Abdominal swelling, pain, or tenderness. °· Dehydration. °DIAGNOSIS °Your symptoms may make your caregiver suspect a C. difficile infection, especially if you have used antibiotics in the preceding weeks. However, there are only 2 ways to know for certain whether you have a C. difficile infection: °· A lab test that finds the toxin in your stool. °· The specific appearance of an abnormality (pseudomembrane) in your colon. This can only be seen by doing a sigmoidoscopy or colonoscopy. These procedures involve passing an instrument through your rectum to look at the inside of your colon. °Your caregiver will help determine if these tests are necessary. °TREATMENT °· Most people are successfully treated with one of two specific antibiotics, usually given by mouth. Other antibiotics you are receiving are stopped if possible. °· Intravenous (IV) fluids and correction of electrolyte imbalance may be necessary. °· Rarely, surgery may be needed to remove the infected part of the intestines. °· Careful  hand washing by you and your caregivers is important to prevent the spread of infection. In the hospital, your caregivers may also put on gowns and gloves to prevent the spread of the C. difficile bacteria. Your room is also cleaned regularly with a solution containing bleach or a product that is known to kill C. difficile. °HOME CARE INSTRUCTIONS °· Drink enough fluids to keep your urine clear or pale yellow. Avoid milk, caffeine, and alcohol. °· Ask your caregiver for specific rehydration instructions. °· Try eating small, frequent meals rather than large meals. °· Take your antibiotics as directed. Finish them even if you start to feel better. °· Do not use medicines to slow diarrhea. This could delay healing or cause complications. °· Wash your hands thoroughly after using the bathroom and before preparing food. °· Make sure people who live with you wash their hands often, too. °· Carefully disinfect all surfaces with a product that contains chlorine bleach. °SEEK MEDICAL CARE IF: °· Diarrhea persists longer than expected or recurs after completing your course of antibiotic treatment for the C. difficile infection. °· You have trouble staying hydrated. °SEEK IMMEDIATE MEDICAL CARE IF: °· You develop a new fever. °· You have increasing abdominal pain or tenderness. °· There is blood in your stools, or your stools are dark black and tarry. °· You cannot hold down food or liquids. °MAKE SURE YOU: °· Understand these instructions. °· Will watch your condition. °· Will get help right away if you are not doing well or get worse. °Document Released: 08/22/2005 Document Revised: 03/29/2014 Document Reviewed: 04/20/2011 °[employer]® Patient Information ©2015 [employer]. This information is not intended to replace advice given to you by your health care provider. Make sure you   discuss any questions you have with your health care provider. ° °

## 2015-05-27 ENCOUNTER — Telehealth: Payer: Self-pay | Admitting: *Deleted

## 2015-05-27 MED ORDER — OXYCODONE-ACETAMINOPHEN 5-325 MG PO TABS: ORAL_TABLET | ORAL | Status: DC

## 2015-05-27 NOTE — Telephone Encounter (Signed)
She can have a refill on the Percocet 5/325 1 by mouth every 4-6 hours when necessary #20 no refills

## 2015-05-27 NOTE — Telephone Encounter (Signed)
hushand informed, rx will be at front desk for pick up.

## 2015-05-27 NOTE — Telephone Encounter (Signed)
Pt husband [name] called stating pt was seen in ER on 05/24/15 due to abdominal swelling and pain in hands/feet. Was given Rx for percocet 5/325 #10 tablets, has 6 tablets left. Husband called requesting refill on Rx. I explained to husband that you are not the prescriber of the percocet and because the type of drug it is I highly doubt you will write Rx. Pt has appointment with Dr.Berry on 05/31/15 and Dr.Mann. I advised husband if patient pain becomes worse to report back to the ER. He asked me to relay the above to you. If no further instructions, I will call husband back and tell him you agree as well. Please advise

## 2015-05-29 LAB — CULTURE, BLOOD (ROUTINE X 2)
CULTURE: NO GROWTH
Culture: NO GROWTH

## 2015-05-31 ENCOUNTER — Encounter: Payer: Self-pay | Admitting: Cardiovascular Disease

## 2015-05-31 ENCOUNTER — Ambulatory Visit (INDEPENDENT_AMBULATORY_CARE_PROVIDER_SITE_OTHER): Payer: BLUE CROSS/BLUE SHIELD | Admitting: Cardiovascular Disease

## 2015-05-31 VITALS — BP 108/70 | HR 83 | Ht 64.0 in | Wt 136.6 lb

## 2015-05-31 DIAGNOSIS — E785 Hyperlipidemia, unspecified: Secondary | ICD-10-CM | POA: Diagnosis not present

## 2015-05-31 DIAGNOSIS — R002 Palpitations: Secondary | ICD-10-CM | POA: Diagnosis not present

## 2015-05-31 NOTE — Assessment & Plan Note (Signed)
History of hyperlipidemia not on statin drugs. Most recent lipid profile from formed in our record approximately a year ago revealed a total cholesterol 165 and LDL of 95, acceptable for primary prevention

## 2015-05-31 NOTE — Assessment & Plan Note (Signed)
[name] returns today for annual follow-up. She has a history of palpitations which on event monitoring was found to be sinus tachycardia versus PSVT. This has been controlled on beta blocker. She's had a normal 2-D echo and Myoview stress test in the past. She was recently hospitalized for C. Difficile and her beta blocker was held because of hypotension resulting in increase in her pregnancy palpitations. She is back on her beta blocker now. She is otherwise asymptomatic other than fatigue.

## 2015-05-31 NOTE — Patient Instructions (Signed)
Dr Lutricia Feil or Dr Elta [facility] [address], [place], [address] [phone]  Dr Gwenlyn Found recommends that you schedule a follow-up appointment in  1 year. You will receive a reminder letter in the mail two months in advance. If you don't receive a letter, please call our office to schedule the follow-up appointment.

## 2015-05-31 NOTE — Progress Notes (Signed)
05/31/2015 [name]   24-Nov-1956  [id number]  Primary Physician Ashlee Mass, MD Primary Cardiologist: Ashlee Harp MD [name]   HPI:  The patient returns today for followup. She is a 58 year old thin and fit-appearing married Caucasian female, mother of 2, who works as an Futures trader.she is accompanied by her husband [name] today. I had seen her initially 5 years ago, and she was referred back in September for chest pain and palpitations. She is on Adderall and has stopped drinking caffeine. She has had thyroid ablation and is having thyroid replacement, managed by Dr. Chalmers Taylor. A Myoview stress test was normal. An event monitor just showed sinus tachycardia versus PSVT. I did begin her on low-dose beta blockade, which resulted in marked improvement in her clinical symptoms. Since I saw her back in 02/23/14 she's been doing well from a heart point of view. She is recently hospitalized for C. Difficile with dehydration requiring fluid resuscitation. Her beta blocker was briefly held because of hypotension resulting in exacerbation of her palpitations. She is back on her beta blocker now. Her only complaints are of fatigue.  Current Outpatient Prescriptions  Medication Sig Dispense Refill  . amphetamine-dextroamphetamine (ADDERALL) 15 MG tablet take 1 tablet by mouth twice a day AT LEAST 6 HOURS APART  0  . cetirizine (ZYRTEC) 10 MG tablet Take 10 mg by mouth daily.    . hydrOXYzine (ATARAX/VISTARIL) 25 MG tablet Take 1 tablet (25 mg total) by mouth every 6 (six) hours as needed for itching. 20 tablet 0  . ibuprofen (ADVIL,MOTRIN) 200 MG tablet Take 400 mg by mouth every 6 (six) hours as needed for moderate pain.    . metoprolol succinate (TOPROL-XL) 50 MG 24 hr tablet take 1 tablet by mouth once daily 30 tablet 2  . oxyCODONE-acetaminophen (PERCOCET/ROXICET) 5-325 MG per tablet Take 1 tablet by mouth every 4-6 hours when necessary 20 tablet 0  . progesterone  (PROMETRIUM) 100 MG capsule TAKE 1 CAPSULE BY MOUTH AT NIGHT  0  . sertraline (ZOLOFT) 50 MG tablet Take 75 mg by mouth daily.   0  . thyroid (ARMOUR) 65 MG tablet Take 65 mg by mouth daily.    . vancomycin (VANCOCIN) 50 mg/mL oral solution Take 2.5 mLs (125 mg total) by mouth 4 (four) times daily. 130 mL 0  . metroNIDAZOLE (FLAGYL) 500 MG tablet Take 1 tablet by mouth every 8 (eight) hours. For 10 days  0   Current Facility-Administered Medications  Medication Dose Route Frequency Provider Last Rate Last Dose  . levonorgestrel (MIRENA) 20 MCG/24HR IUD   Intrauterine Once Ashlee Mass, MD        Allergies  Allergen Reactions  . Lomotil [Diphenoxylate] Hives    SWELLING, ITCHING     History   Social History  . Marital Status: Married    Spouse Name: N/A  . Number of Children: N/A  . Years of Education: N/A   Occupational History  . Not on file.   Social History Main Topics  . Smoking status: Never Smoker   . Smokeless tobacco: Never Used  . Alcohol Use: Yes     Comment: occ, two glasses of wine a week  . Drug Use: No  . Sexual Activity: Yes   Other Topics Concern  . Not on file   Social History Narrative     Review of Systems: General: negative for chills, fever, night sweats or weight changes.  Cardiovascular: negative for chest pain, dyspnea  on exertion, edema, orthopnea, palpitations, paroxysmal nocturnal dyspnea or shortness of breath Dermatological: negative for rash Respiratory: negative for cough or wheezing Urologic: negative for hematuria Abdominal: negative for nausea, vomiting, diarrhea, bright red blood per rectum, melena, or hematemesis Neurologic: negative for visual changes, syncope, or dizziness All other systems reviewed and are otherwise negative except as noted above.    Blood pressure 108/70, pulse 83, height 5\' 4"  (1.626 m), weight 136 lb 9.6 oz (61.961 kg), last menstrual period 03/18/2015.  General appearance: alert and no  distress Neck: no adenopathy, no carotid bruit, no JVD, supple, symmetrical, trachea midline and thyroid not enlarged, symmetric, no tenderness/Taylor/nodules Lungs: clear to auscultation bilaterally Heart: regular rate and rhythm, S1, S2 normal, no murmur, click, rub or gallop Extremities: extremities normal, atraumatic, no cyanosis or edema  EKG not performed today  ASSESSMENT AND PLAN:   Palpitations [name] returns today for annual follow-up. She has a history of palpitations which on event monitoring was found to be sinus tachycardia versus PSVT. This has been controlled on beta blocker. She's had a normal 2-D echo and Myoview stress test in the past. She was recently hospitalized for C. Difficile and her beta blocker was held because of hypotension resulting in increase in her pregnancy palpitations. She is back on her beta blocker now. She is otherwise asymptomatic other than fatigue.  Hyperlipidemia History of hyperlipidemia not on statin drugs. Most recent lipid profile from formed in our record approximately a year ago revealed a total cholesterol 165 and LDL of 95, acceptable for primary prevention      Ashlee Harp MD [facility], [facility] 05/31/2015 11:13 AM

## 2015-07-16 ENCOUNTER — Other Ambulatory Visit: Payer: Self-pay | Admitting: Cardiovascular Disease

## 2015-07-18 NOTE — Telephone Encounter (Signed)
Rx(s) sent to pharmacy electronically.  

## 2015-10-24 ENCOUNTER — Encounter: Payer: Self-pay | Admitting: Women's Health

## 2015-10-24 ENCOUNTER — Ambulatory Visit (INDEPENDENT_AMBULATORY_CARE_PROVIDER_SITE_OTHER): Payer: BLUE CROSS/BLUE SHIELD | Admitting: Women's Health

## 2015-10-24 VITALS — BP 126/80 | Ht 64.0 in | Wt 136.0 lb

## 2015-10-24 DIAGNOSIS — N898 Other specified noninflammatory disorders of vagina: Secondary | ICD-10-CM | POA: Diagnosis not present

## 2015-10-24 DIAGNOSIS — R350 Frequency of micturition: Secondary | ICD-10-CM

## 2015-10-24 DIAGNOSIS — Z01419 Encounter for gynecological examination (general) (routine) without abnormal findings: Secondary | ICD-10-CM

## 2015-10-24 LAB — URINALYSIS W MICROSCOPIC + REFLEX CULTURE
BILIRUBIN URINE: NEGATIVE
CRYSTALS: NONE SEEN [HPF]
GLUCOSE, UA: NEGATIVE
Ketones, ur: NEGATIVE
Nitrite: NEGATIVE
PROTEIN: NEGATIVE
Specific Gravity, Urine: 1.015 (ref 1.001–1.035)
Yeast: NONE SEEN [HPF]
pH: 7 (ref 5.0–8.0)

## 2015-10-24 LAB — WET PREP FOR TRICH, YEAST, CLUE
CLUE CELLS WET PREP: NONE SEEN
TRICH WET PREP: NONE SEEN
Yeast Wet Prep HPF POC: NONE SEEN

## 2015-10-24 MED ORDER — PHENAZOPYRIDINE HCL 100 MG PO TABS: 100.0000 mg | ORAL_TABLET | Freq: Three times a day (TID) | ORAL | Status: DC | PRN

## 2015-10-24 NOTE — Progress Notes (Signed)
Patient ID: [name], female   DOB: 1956-01-29, 59 y.o.   MRN: [id number] Presents with complaint of 3 week history of burning especially at end of stream of urination. Mild vaginal irritation with minimal itching, denies odor, abdominal pain or fever. Mild low backache. 04/2015 C. difficile subsequent arthritis after, still continuing to heal. 08/2014 Mirena IUD placed for irregular bleeding, amenorrheic  Exam: Appears well. External genitalia within normal limits, speculum exam IUD strings visible, minimal discharge, no erythema, wet prep negative. Bimanual no CMT or adnexal tenderness. No CVAT.  UA: +1 leukocytes, 10-20 WBCs, 3-10 RBCs, many bacteria  Probable UTI  Plan: Reviewed antibiotic, would like to wait for urine culture results due to C. difficile history. Will continue  fluids, Pyridium 100 3 times a day as needed. Prescription, proper use given.

## 2015-10-24 NOTE — Patient Instructions (Signed)

## 2015-10-26 ENCOUNTER — Other Ambulatory Visit: Payer: Self-pay | Admitting: Women's Health

## 2015-10-26 DIAGNOSIS — N3000 Acute cystitis without hematuria: Secondary | ICD-10-CM

## 2015-10-26 MED ORDER — SULFAMETHOXAZOLE-TRIMETHOPRIM 800-160 MG PO TABS: 1.0000 | ORAL_TABLET | Freq: Two times a day (BID) | ORAL | Status: DC

## 2015-10-27 LAB — URINE CULTURE

## 2016-01-26 ENCOUNTER — Encounter: Payer: Self-pay | Admitting: Women's Health

## 2016-01-26 ENCOUNTER — Ambulatory Visit (INDEPENDENT_AMBULATORY_CARE_PROVIDER_SITE_OTHER): Payer: BLUE CROSS/BLUE SHIELD | Admitting: Women's Health

## 2016-01-26 VITALS — BP 110/80 | Ht 64.0 in | Wt 136.0 lb

## 2016-01-26 DIAGNOSIS — N3001 Acute cystitis with hematuria: Secondary | ICD-10-CM | POA: Diagnosis not present

## 2016-01-26 DIAGNOSIS — R350 Frequency of micturition: Secondary | ICD-10-CM | POA: Diagnosis not present

## 2016-01-26 LAB — URINALYSIS W MICROSCOPIC + REFLEX CULTURE
BILIRUBIN URINE: NEGATIVE
CRYSTALS: NONE SEEN [HPF]
Casts: NONE SEEN [LPF]
GLUCOSE, UA: NEGATIVE
Ketones, ur: NEGATIVE
NITRITE: POSITIVE — AB
SPECIFIC GRAVITY, URINE: 1.02 (ref 1.001–1.035)
Yeast: NONE SEEN [HPF]
pH: 6.5 (ref 5.0–8.0)

## 2016-01-26 MED ORDER — SULFAMETHOXAZOLE-TRIMETHOPRIM 800-160 MG PO TABS: 1.0000 | ORAL_TABLET | Freq: Two times a day (BID) | ORAL | Status: DC

## 2016-01-26 NOTE — Progress Notes (Signed)
Patient ID: [name], female   DOB: [birth year], 60 y.o.   MRN: [id number] Presents with complaint of increased urinary pressure, urgency, pain and burning at end of stream and low back ache for 2 days. Amenorrheic Mirena IUD 08/2014 on no HRT. Denies vaginal discharge, itching, odor or fever.  Exam: Appears well. UA: +3 blood, positive nitrites, +3 leukocytes, packed WBCs, packed RBCs, many bacteria.  UTI with hematuria  Plan: Septra twice daily for 3 days #6. Instructed to call if no relief of symptoms. UTI prevention discussed. Urine culture pending.

## 2016-01-26 NOTE — Patient Instructions (Signed)

## 2016-01-28 ENCOUNTER — Encounter: Payer: Self-pay | Admitting: Women's Health

## 2016-01-28 LAB — URINE CULTURE

## 2016-02-23 ENCOUNTER — Encounter: Payer: Self-pay | Admitting: Women's Health

## 2016-02-23 ENCOUNTER — Ambulatory Visit (INDEPENDENT_AMBULATORY_CARE_PROVIDER_SITE_OTHER): Payer: BLUE CROSS/BLUE SHIELD | Admitting: Women's Health

## 2016-02-23 VITALS — BP 126/80 | Ht 64.0 in | Wt 136.0 lb

## 2016-02-23 DIAGNOSIS — R350 Frequency of micturition: Secondary | ICD-10-CM

## 2016-02-23 DIAGNOSIS — N3000 Acute cystitis without hematuria: Secondary | ICD-10-CM | POA: Diagnosis not present

## 2016-02-23 LAB — URINALYSIS W MICROSCOPIC + REFLEX CULTURE
Bilirubin Urine: NEGATIVE
Casts: NONE SEEN [LPF]
Crystals: NONE SEEN [HPF]
Glucose, UA: NEGATIVE
KETONES UR: NEGATIVE
NITRITE: NEGATIVE
PH: 7 (ref 5.0–8.0)
Protein, ur: NEGATIVE
SPECIFIC GRAVITY, URINE: 1.015 (ref 1.001–1.035)
WBC, UA: >60 WBC/HPF — AB (ref <=5)
YEAST: NONE SEEN [HPF]

## 2016-02-23 MED ORDER — NITROFURANTOIN MACROCRYSTAL 100 MG PO CAPS: 100.0000 mg | ORAL_CAPSULE | Freq: Four times a day (QID) | ORAL | Status: DC

## 2016-02-23 MED ORDER — NITROFURANTOIN MACROCRYSTAL 50 MG PO CAPS: ORAL_CAPSULE | ORAL | Status: DC

## 2016-02-23 NOTE — Progress Notes (Addendum)
Patient ID: [name], female   DOB: [birth year], 60 y.o.   MRN: [id number] Presents with complaint of increased urinary frequency, urgency and pain especially at end of stream of urination for the past 2 days. Some relief with Azo. Last UTI 01/25/2016, treated with Septra. Fourth UTI this year questionable intercourse related. Denies vaginal discharge, itching, odor or fever.  Exam: Appears well. No CVAT. External genitalia within normal limits. UA: UA +3 leukocytes, >60 WBCs, 40-60 RBCs, many bacteria 6-10 squamous epithelials  Recurrent UTI  Plan: Options reviewed, Macrobid twice daily for 7 days take with food prescription, proper use given and reviewed. Macrodantin 50 mg by mouth to take with intercourse. UTI prevention discussed. Urine culture pending.

## 2016-02-23 NOTE — Patient Instructions (Signed)

## 2016-02-25 LAB — URINE CULTURE

## 2016-02-27 ENCOUNTER — Telehealth: Payer: Self-pay

## 2016-02-27 NOTE — Telephone Encounter (Signed)
Patient said symptoms are better but still with some pressure with urination.  She said when you were in the room talking to her you told her you were going to call in Rx for 7 days worth of antibiotic but she only has like 3-4 days worth (please note when you sent Rx directions are take 100mg  four times daily.)  Patient wanted me to check with you on why you did not send 7 days worth?

## 2016-02-27 NOTE — Telephone Encounter (Signed)
-----   Message from Huel Cote, NP sent at 02/26/2016  6:19 PM EDT ----- Please call and inform urine only showed small amt of bacteria, her symptoms just started, has had recurrent uti's, ask if better, let me know if still symptoms

## 2016-02-27 NOTE — Telephone Encounter (Signed)
  TC reviewed the med was supposed to be twice daily, tolerated without upset stomach, feels 75% better, will watch for now.

## 2016-03-19 ENCOUNTER — Other Ambulatory Visit: Payer: Self-pay

## 2016-03-19 MED ORDER — VALACYCLOVIR HCL 1 G PO TABS: 1000.0000 mg | ORAL_TABLET | Freq: Every day | ORAL | Status: DC

## 2016-03-22 DIAGNOSIS — M1811 Unilateral primary osteoarthritis of first carpometacarpal joint, right hand: Secondary | ICD-10-CM | POA: Diagnosis not present

## 2016-03-22 DIAGNOSIS — M1812 Unilateral primary osteoarthritis of first carpometacarpal joint, left hand: Secondary | ICD-10-CM | POA: Diagnosis not present

## 2016-03-29 DIAGNOSIS — M79641 Pain in right hand: Secondary | ICD-10-CM | POA: Diagnosis not present

## 2016-03-29 DIAGNOSIS — M19241 Secondary osteoarthritis, right hand: Secondary | ICD-10-CM | POA: Diagnosis not present

## 2016-03-29 DIAGNOSIS — M25561 Pain in right knee: Secondary | ICD-10-CM | POA: Diagnosis not present

## 2016-04-12 ENCOUNTER — Encounter: Payer: Self-pay | Admitting: Gynecology

## 2016-04-12 ENCOUNTER — Other Ambulatory Visit: Payer: Self-pay | Admitting: Gynecology

## 2016-04-12 ENCOUNTER — Ambulatory Visit (INDEPENDENT_AMBULATORY_CARE_PROVIDER_SITE_OTHER): Payer: BLUE CROSS/BLUE SHIELD | Admitting: Gynecology

## 2016-04-12 VITALS — BP 118/76

## 2016-04-12 DIAGNOSIS — N950 Postmenopausal bleeding: Secondary | ICD-10-CM

## 2016-04-12 DIAGNOSIS — N858 Other specified noninflammatory disorders of uterus: Secondary | ICD-10-CM | POA: Diagnosis not present

## 2016-04-12 DIAGNOSIS — Z30432 Encounter for removal of intrauterine contraceptive device: Secondary | ICD-10-CM

## 2016-04-12 NOTE — Progress Notes (Signed)
   HPI: Is a 60 year old who presented to the office stating that last week after she had intercourse she had large amounts of vaginal bleeding and then this week which she had intercourse with her spouse she had some spotting. Review of her record indicated that she is on estradiol 0.5 mg daily and has a Mirena IUD and is taking the Prometrium which now her holistic medicine physician has her on it 2 times a day. Patient has not had a mammogram in over 2 years. Patient denies any nausea, vomiting, fever, chills, abdominal pains or any back pain. No change in appetite. She is very active.   ROS: A ROS was performed and pertinent positives and negatives are included in the history.  GENERAL: No fevers or chills. HEENT: No change in vision, no earache, sore throat or sinus congestion. NECK: No pain or stiffness. CARDIOVASCULAR: No chest pain or pressure. No palpitations. PULMONARY: No shortness of breath, cough or wheeze. GASTROINTESTINAL: No abdominal pain, nausea, vomiting or diarrhea, melena or bright red blood per rectum. GENITOURINARY: No urinary frequency, urgency, hesitancy or dysuria. MUSCULOSKELETAL: No joint or muscle pain, no back pain, no recent trauma. DERMATOLOGIC: No rash, no itching, no lesions. ENDOCRINE: No polyuria, polydipsia, no heat or cold intolerance. No recent change in weight. HEMATOLOGICAL: No anemia or easy bruising or bleeding. NEUROLOGIC: No headache, seizures, numbness, tingling or weakness. PSYCHIATRIC: No depression, no loss of interest in normal activity or change in sleep pattern.   PE: Blood pressure 118/76 Gen. appearance well-developed well-nourished female with above-mentioned complaints Abdomen: Soft nontender no rebound or guarding Back: No CVA tenderness Pelvic: Bartholin urethra Skene was within normal limits Vagina: No active bleeding noted, IUD string visualized Cervix: No active bleeding noted no lesions seen Uterus: Anteverted normal size shape and  consistency Adnexa: No palpable masses or tenderness Rectal exam not done  Patient was counseled to remove her Mirena IUD and proceed with an endometrial biopsy. The cervix was cleansed Betadine solution. With the use or ring forcep the IUD string was grasped retrieved shown to the patient discarded. Following this a sterile Pipelle was introduced into the uterine cavity. The uterus sounded to 7 cm. Moderate amount of tissue was obtained and was made for histological evaluation. Patient. Tolerated procedure well with no complications.    Assessment Plan: 60 year old patient with postmenopausal bleeding. IUD removed. Endometrial biopsy obtained results pending at time of this dictation. I once again raise my concern of 2 high progesterone that she is taking that her holistic medicine provider had prescribed. I voiced my concern that this along with her estrogen could increase her risk of breast cancer. She is also overdue for her mammogram for which a requisition was provided. She will return back to the office in 1-2 weeks for sonohysterogram to complete evaluation.    Greater than 50% of time was spent in counseling and coordinating care of this patient.   Time of consultation: 15   Minutes.

## 2016-04-12 NOTE — Addendum Note (Signed)
Addended by: Thurnell Garbe A on: 04/12/2016 09:15 AM   Modules accepted: Orders

## 2016-04-12 NOTE — Patient Instructions (Signed)

## 2016-04-17 ENCOUNTER — Telehealth: Payer: Self-pay | Admitting: Gynecology

## 2016-04-17 NOTE — Telephone Encounter (Signed)
04/17/16-Pt was advised today that her [facility] states she will have a $25 copay for the sonohysterogram/bx with JF. Per Renee@BC -[id number]

## 2016-04-25 ENCOUNTER — Other Ambulatory Visit: Payer: Self-pay | Admitting: Gynecology

## 2016-04-25 ENCOUNTER — Encounter: Payer: Self-pay | Admitting: Gynecology

## 2016-04-25 ENCOUNTER — Ambulatory Visit (INDEPENDENT_AMBULATORY_CARE_PROVIDER_SITE_OTHER): Payer: BLUE CROSS/BLUE SHIELD

## 2016-04-25 ENCOUNTER — Ambulatory Visit (INDEPENDENT_AMBULATORY_CARE_PROVIDER_SITE_OTHER): Payer: BLUE CROSS/BLUE SHIELD | Admitting: Gynecology

## 2016-04-25 DIAGNOSIS — N950 Postmenopausal bleeding: Secondary | ICD-10-CM | POA: Diagnosis not present

## 2016-04-25 DIAGNOSIS — D251 Intramural leiomyoma of uterus: Secondary | ICD-10-CM | POA: Diagnosis not present

## 2016-04-25 DIAGNOSIS — Z1231 Encounter for screening mammogram for malignant neoplasm of breast: Secondary | ICD-10-CM | POA: Diagnosis not present

## 2016-04-25 NOTE — Progress Notes (Signed)
   Patient is a 60 year old was seen in the office on May 18 as a result of postmenopausal bleeding. Her history is as follows: Patient stated that the week prior to the last office visit that shortly after intercourse she had large amounts of vaginal bleeding and then this week which she had intercourse with her spouse she had some spotting. Review of her record indicated that she is on estradiol 0.5 mg daily and has a Mirena IUD and is taking the Prometrium which now her holistic medicine physician has her on it 2 times a day. I was concerned that she was receiving too much progesterone putting her at risk along with the estrogen for breast cancer. So the IUD was removed and she had an endometrial biopsy and is here for sonohysterogram as part of her evaluation.  Her pathology report demonstrated the following: Diagnosis Endometrium, biopsy, uterus - BENIGN ENDOMETRIAL TISSUE WITH PROGESTATIONAL EFFECT. - NO HYPERPLASIA, ATYPIA, OR MALIGNANCY IDENTIFIED.  Patient was counseled for sonohysterogram. The cervix was cleansed with Betadine solution. Sterile catheter was introduced into the uterine cavity and no intrauterine defect was noted. Patient intramural fibroid measuring 27 x 17 mm and a second one measuring 19 x 19 mm. left ovary was normal prior history of RSO.  Assessment/plan: Patient with dysfunctional uterine bleeding as a result of daily progesterone as well as Mirena IUD. Mirena IUD was removed on last office visit an endometrial biopsy as reported above was normal. Normal sonohysterogram. Patient will continue on her estradiol 0.5 mg daily with the addition of Prometrium 200 mg for only 12 days of the month. If she has any irregular bleeding she'll return back to the office.

## 2016-05-04 ENCOUNTER — Encounter: Payer: Self-pay | Admitting: Gynecology

## 2016-05-22 DIAGNOSIS — D2312 Other benign neoplasm of skin of left eyelid, including canthus: Secondary | ICD-10-CM | POA: Diagnosis not present

## 2016-05-25 ENCOUNTER — Encounter: Payer: BLUE CROSS/BLUE SHIELD | Admitting: Gynecology

## 2016-06-05 ENCOUNTER — Ambulatory Visit (INDEPENDENT_AMBULATORY_CARE_PROVIDER_SITE_OTHER): Payer: BLUE CROSS/BLUE SHIELD | Admitting: Gynecology

## 2016-06-05 ENCOUNTER — Encounter: Payer: Self-pay | Admitting: Gynecology

## 2016-06-05 VITALS — BP 98/66 | Ht 64.0 in | Wt 143.0 lb

## 2016-06-05 DIAGNOSIS — Z01419 Encounter for gynecological examination (general) (routine) without abnormal findings: Secondary | ICD-10-CM | POA: Diagnosis not present

## 2016-06-05 DIAGNOSIS — Z780 Asymptomatic menopausal state: Secondary | ICD-10-CM | POA: Diagnosis not present

## 2016-06-05 LAB — CBC WITH DIFFERENTIAL/PLATELET
BASOS ABS: 52 {cells}/uL (ref 0–200)
BASOS PCT: 1 %
EOS ABS: 260 {cells}/uL (ref 15–500)
Eosinophils Relative: 5 %
HEMATOCRIT: 36.9 % (ref 35.0–45.0)
Hemoglobin: 12 g/dL (ref 11.7–15.5)
LYMPHS PCT: 33 %
Lymphs Abs: 1716 cells/uL (ref 850–3900)
MCH: 28.2 pg (ref 27.0–33.0)
MCHC: 32.5 g/dL (ref 32.0–36.0)
MCV: 86.8 fL (ref 80.0–100.0)
MONO ABS: 364 {cells}/uL (ref 200–950)
MPV: 10.8 fL (ref 7.5–12.5)
Monocytes Relative: 7 %
NEUTROS PCT: 54 %
Neutro Abs: 2808 cells/uL (ref 1500–7800)
Platelets: 289 10*3/uL (ref 140–400)
RBC: 4.25 MIL/uL (ref 3.80–5.10)
RDW: 13.7 % (ref 11.0–15.0)
WBC: 5.2 10*3/uL (ref 3.8–10.8)

## 2016-06-05 NOTE — Patient Instructions (Signed)

## 2016-06-05 NOTE — Progress Notes (Signed)
[name] 11/26/1956 [id number]   History:    60 y.o.  for annual gyn exam who is also been followed by holistic medicine physician who has been monitoring her thyroid. Patient in the past had been followed by the endocrinologist Dr. Bubba Camp for her nontoxic multinodular goiter.patient informed me that in August of 2013 she had iodine 131 treatment for her thyroid.Patient prior to that has had a subtotal thyroidectomy in the past.Review of her record indicated also that she has had laparoscopic right salpingo-oophorectomy in the past and has had a history of stage IV endometriosis. She also suffers from HSV but infrequently. Patient with no prior history of abnormal Pap smears. Patient had a normal bone density study in 2015. She also had a normal colonoscopy in 2012.  Patient was seen in the office in May of this year whereby her Mirena IUD was removed and she was instructed to take her Prometrium 200 mg for only 12 days of the month and to continue her estradiol 0.5 mg daily. Her holistic medicine physician had her on twice a day Prometrium and she had irregular bleeding and had an endometrial biopsy and sonohysterogram recently which was normal. Since the Mirena IUD was removed and she's taking the estradiol daily with the addition of the progesterone for 12 days a month she's had no further bleeding.    Past medical history,surgical history, family history and social history were all reviewed and documented in the EPIC chart.  Gynecologic History Patient's last menstrual period was 03/18/2015 (approximate). Contraception: post menopausal status Last Pap: 2012 2015. Results were: normal Last mammogram: 2017. Results were: normal  Obstetric History OB History  Gravida Para Term Preterm AB SAB TAB Ectopic Multiple Living  2 2 2       2     # Outcome Date GA Lbr Len/2nd Weight Sex Delivery Anes PTL Lv  2 Term     M Vag-Spont  N Y  1 Term     F Vag-Spont  N Y       ROS: A ROS was  performed and pertinent positives and negatives are included in the history.  GENERAL: No fevers or chills. HEENT: No change in vision, no earache, sore throat or sinus congestion. NECK: No pain or stiffness. CARDIOVASCULAR: No chest pain or pressure. No palpitations. PULMONARY: No shortness of breath, cough or wheeze. GASTROINTESTINAL: No abdominal pain, nausea, vomiting or diarrhea, melena or bright red blood per rectum. GENITOURINARY: No urinary frequency, urgency, hesitancy or dysuria. MUSCULOSKELETAL: No joint or muscle pain, no back pain, no recent trauma. DERMATOLOGIC: No rash, no itching, no lesions. ENDOCRINE: No polyuria, polydipsia, no heat or cold intolerance. No recent change in weight. HEMATOLOGICAL: No anemia or easy bruising or bleeding. NEUROLOGIC: No headache, seizures, numbness, tingling or weakness. PSYCHIATRIC: No depression, no loss of interest in normal activity or change in sleep pattern.     Exam: chaperone present  BP 98/66 mmHg  Ht 5\' 4"  (1.626 m)  Wt 143 lb (64.864 kg)  BMI 24.53 kg/m2  LMP 03/18/2015 (Approximate)  Body mass index is 24.53 kg/(m^2).  General appearance : Well developed well nourished female. No acute distress HEENT: Eyes: no retinal hemorrhage or exudates,  Neck supple, trachea midline, no carotid bruits, no thyroidmegaly Lungs: Clear to auscultation, no rhonchi or wheezes, or rib retractions  Heart: Regular rate and rhythm, no murmurs or gallops Breast:Examined in sitting and supine position were symmetrical in appearance, no palpable masses or tenderness,  no  skin retraction, no nipple inversion, no nipple discharge, no skin discoloration, no axillary or supraclavicular lymphadenopathy Abdomen: no palpable masses or tenderness, no rebound or guarding Extremities: no edema or skin discoloration or tenderness  Pelvic:  Bartholin, Urethra, Skene Glands: Within normal limits             Vagina: No gross lesions or discharge  Cervix: No gross  lesions or discharge  Uterus  anteverted, normal size, shape and consistency, non-tender and mobile  Adnexa  Without masses or tenderness  Anus and perineum  normal   Rectovaginal  normal sphincter tone without palpated masses or tenderness             Hemoccult cards provided     Assessment/Plan:  60 y.o. female for annual exam doing well after her hormone replacement therapy was adjusted by recommended guidelines. No further postmenopausal bleeding reported. The following screening blood work was ordered today: Comprehensive metabolic panel, fasting lipid profile, TSH, CBC, and urinalysis. Pap smear not indicated this year. She was provided with fecal Hemoccult cards to submit to the office for testing. Patient to schedule her bone density study.   Terrance Mass MD, 10:08 AM 06/05/2016

## 2016-06-06 LAB — URINALYSIS W MICROSCOPIC + REFLEX CULTURE
Bacteria, UA: NONE SEEN [HPF]
Bilirubin Urine: NEGATIVE
Casts: NONE SEEN [LPF]
Crystals: NONE SEEN [HPF]
Glucose, UA: NEGATIVE
Ketones, ur: NEGATIVE
NITRITE: NEGATIVE
PH: 6 (ref 5.0–8.0)
PROTEIN: NEGATIVE
Specific Gravity, Urine: 1.016 (ref 1.001–1.035)
Yeast: NONE SEEN [HPF]

## 2016-06-06 LAB — COMPREHENSIVE METABOLIC PANEL
ALK PHOS: 67 U/L (ref 33–130)
ALT: 49 U/L — AB (ref 6–29)
AST: 35 U/L (ref 10–35)
Albumin: 4.2 g/dL (ref 3.6–5.1)
BUN: 11 mg/dL (ref 7–25)
CALCIUM: 9.2 mg/dL (ref 8.6–10.4)
CHLORIDE: 104 mmol/L (ref 98–110)
CO2: 26 mmol/L (ref 20–31)
Creat: 0.76 mg/dL (ref 0.50–1.05)
GLUCOSE: 94 mg/dL (ref 65–99)
POTASSIUM: 4 mmol/L (ref 3.5–5.3)
Sodium: 140 mmol/L (ref 135–146)
Total Bilirubin: 0.4 mg/dL (ref 0.2–1.2)
Total Protein: 6.7 g/dL (ref 6.1–8.1)

## 2016-06-06 LAB — LIPID PANEL
CHOL/HDL RATIO: 3 ratio (ref <=5.0)
CHOLESTEROL: 182 mg/dL (ref 125–200)
HDL: 61 mg/dL (ref >=46)
LDL Cholesterol: 93 mg/dL (ref <130)
Triglycerides: 139 mg/dL (ref <150)
VLDL: 28 mg/dL (ref <30)

## 2016-06-06 LAB — TSH: TSH: 0.94 m[IU]/L

## 2016-06-07 ENCOUNTER — Other Ambulatory Visit: Payer: Self-pay | Admitting: Gynecology

## 2016-06-07 DIAGNOSIS — R7989 Other specified abnormal findings of blood chemistry: Secondary | ICD-10-CM

## 2016-06-07 DIAGNOSIS — R945 Abnormal results of liver function studies: Principal | ICD-10-CM

## 2016-06-07 LAB — URINE CULTURE

## 2016-06-11 ENCOUNTER — Other Ambulatory Visit: Payer: Self-pay | Admitting: Gynecology

## 2016-06-11 ENCOUNTER — Other Ambulatory Visit: Payer: BLUE CROSS/BLUE SHIELD

## 2016-06-11 DIAGNOSIS — R7989 Other specified abnormal findings of blood chemistry: Secondary | ICD-10-CM | POA: Diagnosis not present

## 2016-06-11 DIAGNOSIS — R945 Abnormal results of liver function studies: Principal | ICD-10-CM

## 2016-06-11 LAB — ALT: ALT: 37 U/L — AB (ref 6–29)

## 2016-06-11 LAB — AST: AST: 29 U/L (ref 10–35)

## 2016-06-12 ENCOUNTER — Other Ambulatory Visit: Payer: Self-pay | Admitting: Gynecology

## 2016-06-12 DIAGNOSIS — R7989 Other specified abnormal findings of blood chemistry: Secondary | ICD-10-CM

## 2016-06-12 DIAGNOSIS — R945 Abnormal results of liver function studies: Principal | ICD-10-CM

## 2016-06-12 LAB — HEPATITIS PANEL, ACUTE
HCV Ab: NEGATIVE
HEP A IGM: NONREACTIVE
HEP B C IGM: NONREACTIVE
Hepatitis B Surface Ag: NEGATIVE

## 2016-07-03 ENCOUNTER — Ambulatory Visit (INDEPENDENT_AMBULATORY_CARE_PROVIDER_SITE_OTHER): Payer: BLUE CROSS/BLUE SHIELD

## 2016-07-03 ENCOUNTER — Other Ambulatory Visit: Payer: BLUE CROSS/BLUE SHIELD

## 2016-07-03 DIAGNOSIS — Z13820 Encounter for screening for osteoporosis: Secondary | ICD-10-CM

## 2016-07-03 DIAGNOSIS — M8580 Other specified disorders of bone density and structure, unspecified site: Secondary | ICD-10-CM

## 2016-07-03 DIAGNOSIS — Z780 Asymptomatic menopausal state: Secondary | ICD-10-CM

## 2016-07-03 DIAGNOSIS — R7989 Other specified abnormal findings of blood chemistry: Secondary | ICD-10-CM | POA: Diagnosis not present

## 2016-07-03 DIAGNOSIS — M899 Disorder of bone, unspecified: Secondary | ICD-10-CM | POA: Diagnosis not present

## 2016-07-03 DIAGNOSIS — R945 Abnormal results of liver function studies: Principal | ICD-10-CM

## 2016-07-03 LAB — ALT: ALT: 31 U/L — AB (ref 6–29)

## 2016-07-03 LAB — AST: AST: 23 U/L (ref 10–35)

## 2016-07-04 ENCOUNTER — Other Ambulatory Visit: Payer: Self-pay | Admitting: Gynecology

## 2016-07-04 DIAGNOSIS — M8580 Other specified disorders of bone density and structure, unspecified site: Secondary | ICD-10-CM

## 2016-07-04 DIAGNOSIS — Z13820 Encounter for screening for osteoporosis: Secondary | ICD-10-CM

## 2016-07-05 ENCOUNTER — Other Ambulatory Visit: Payer: Self-pay | Admitting: Gynecology

## 2016-07-05 DIAGNOSIS — R945 Abnormal results of liver function studies: Principal | ICD-10-CM

## 2016-07-05 DIAGNOSIS — R7989 Other specified abnormal findings of blood chemistry: Secondary | ICD-10-CM

## 2016-07-06 DIAGNOSIS — D485 Neoplasm of uncertain behavior of skin: Secondary | ICD-10-CM | POA: Diagnosis not present

## 2016-07-06 DIAGNOSIS — D2312 Other benign neoplasm of skin of left eyelid, including canthus: Secondary | ICD-10-CM | POA: Diagnosis not present

## 2016-07-12 ENCOUNTER — Other Ambulatory Visit: Payer: Self-pay | Admitting: Cardiovascular Disease

## 2016-07-12 NOTE — Telephone Encounter (Signed)
REFILL 

## 2016-08-17 IMAGING — CT CT ABD-PELV W/ CM
2 of 5 series · 14 of 46 positions shown, 16 images · IV contrast (OMNIPAQUE 300)
Comparison: May 11, 2008

CLINICAL DATA: Patient reportedly recently diagnosed with
Clostridium difficile colitis. Reportedly not taking all of
prescribed antibiotics. Currently with abdominal pain.

EXAM:
CT ABDOMEN AND PELVIS WITH CONTRAST
TECHNIQUE: Multidetector CT imaging of the abdomen and pelvis was performed
using the standard protocol following bolus administration of
intravenous contrast. Oral contrast was also administered.
CONTRAST:  100mL OMNIPAQUE IOHEXOL 300 MG/ML  SOLN

[Series 2: abd/pel with · axial · 0.67mm/px · z∈[-522,-137]mm · 11 of 87 slices shown, 13 images]
[im 5/87  soft-tissue]
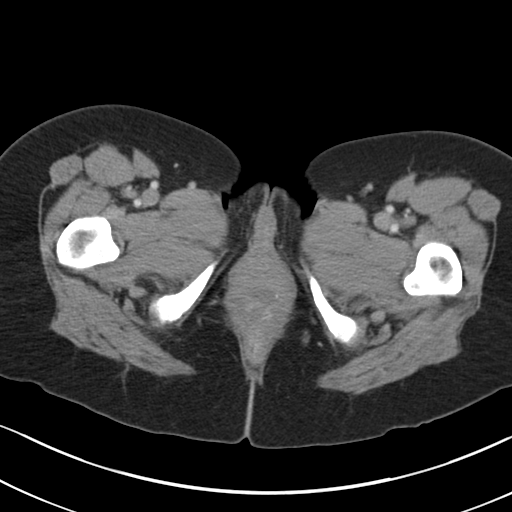
[im 5/87  bone]
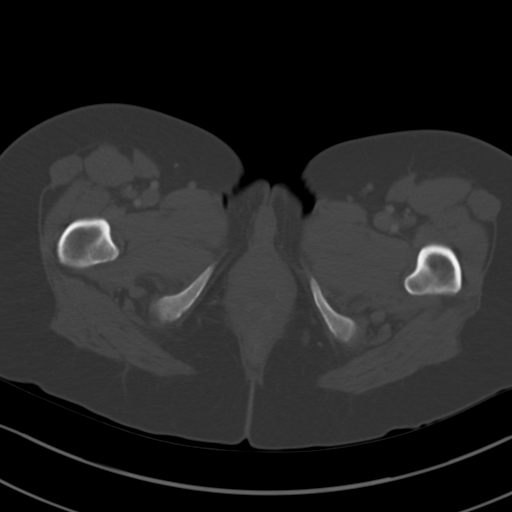
[im 15/87  soft-tissue]
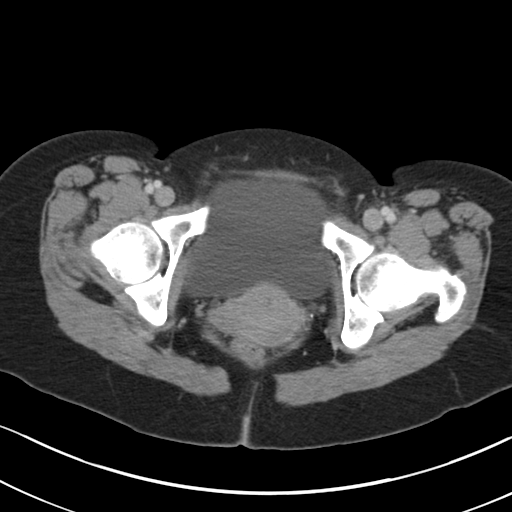
[im 20/87  soft-tissue]
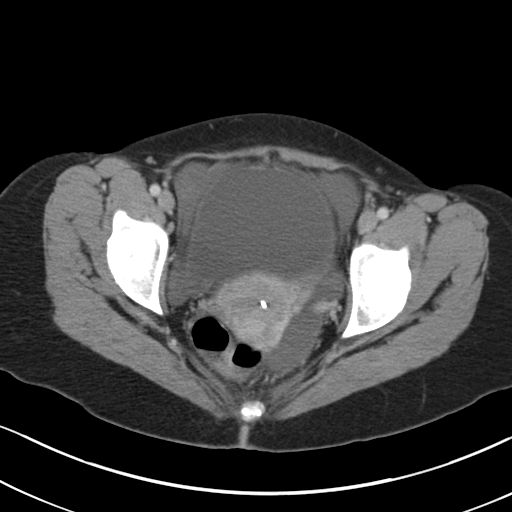
[im 29/87  soft-tissue]
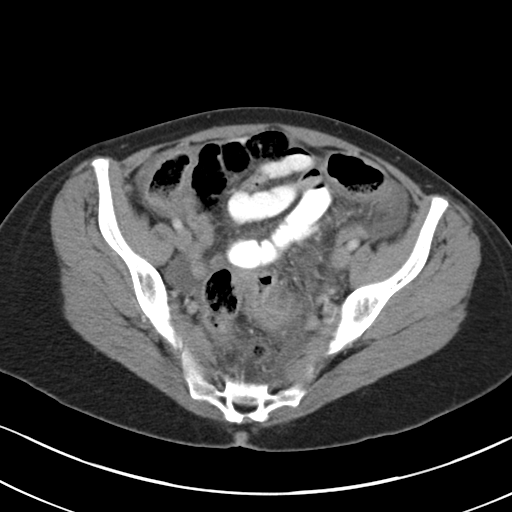
[im 34/87  soft-tissue]
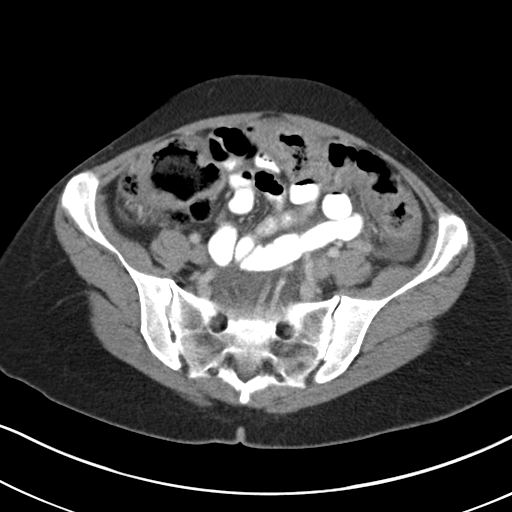
[im 44/87  soft-tissue]
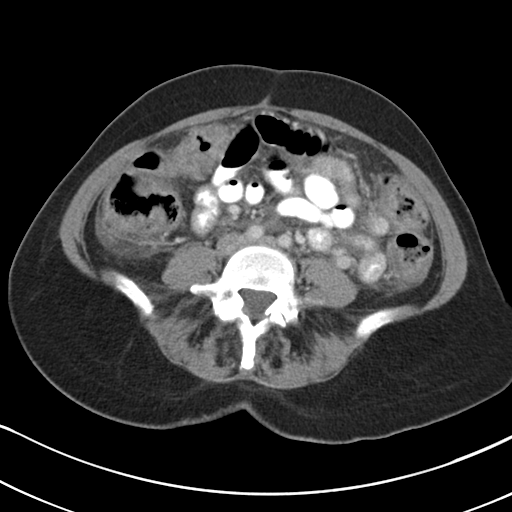
[im 53/87  soft-tissue]
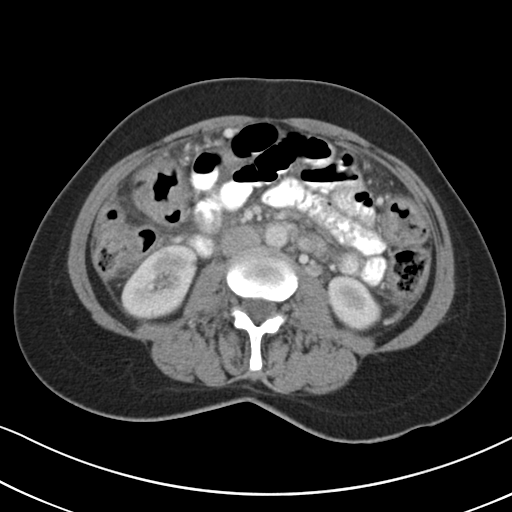
[im 58/87  soft-tissue]
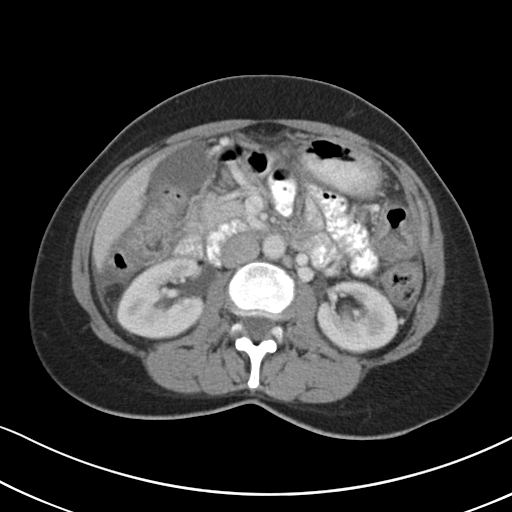
[im 67/87  soft-tissue]
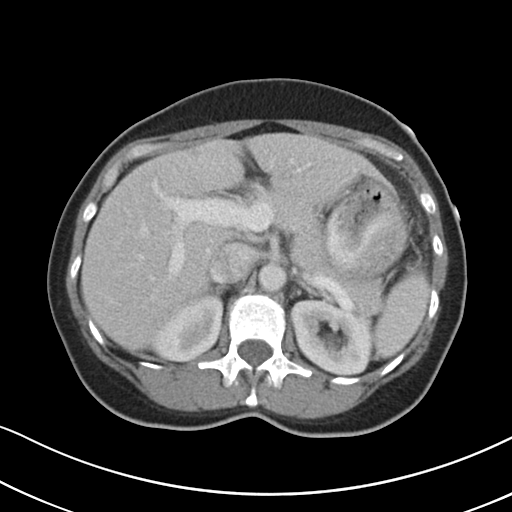
[im 67/87  bone]
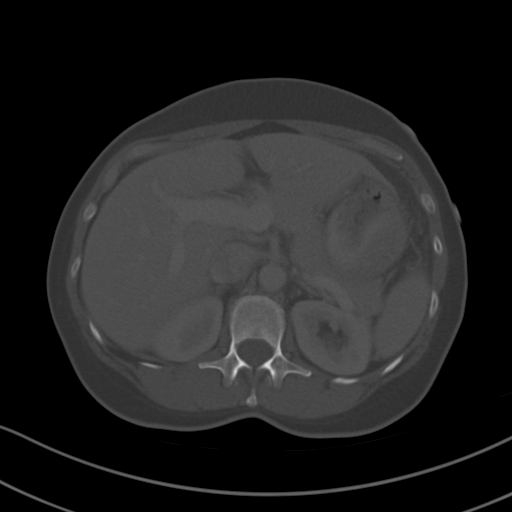
[im 72/87  soft-tissue]
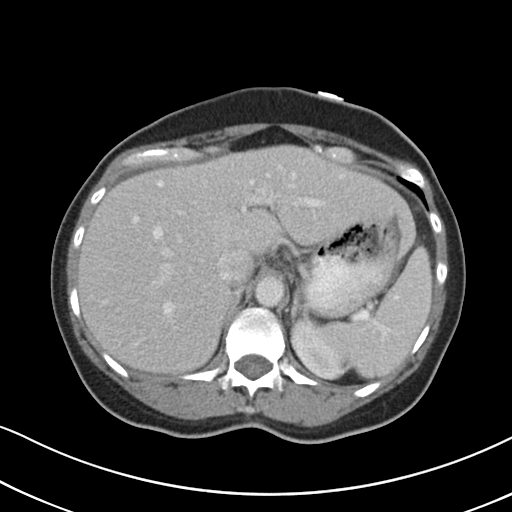
[im 82/87  soft-tissue]
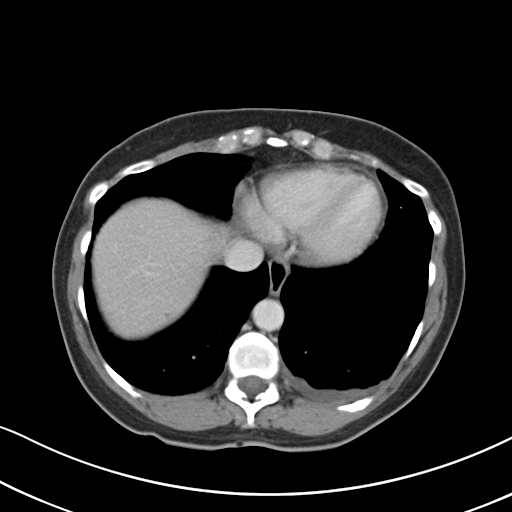

[Series 3: coronal a/|p · coronal · 0.88mm/px · 3 of 85 slices shown]
[im 29/85  soft-tissue]
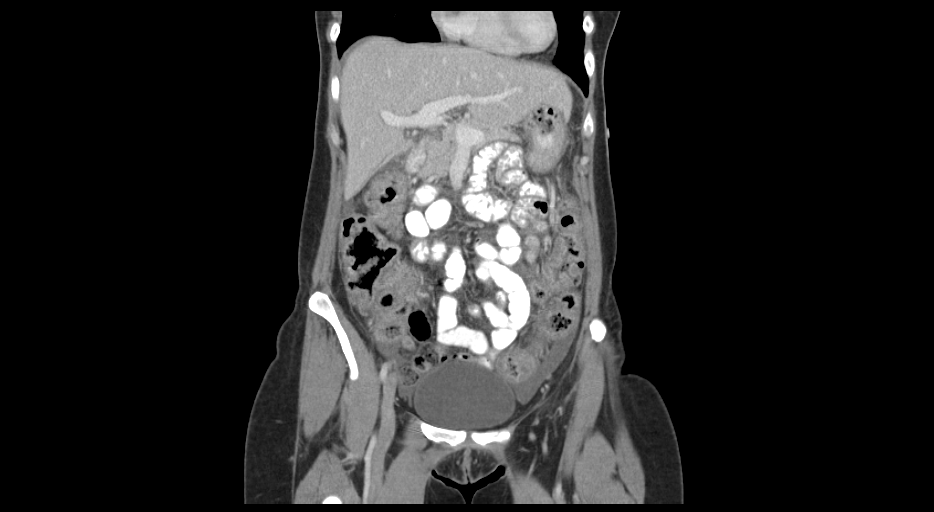
[im 38/85  soft-tissue]
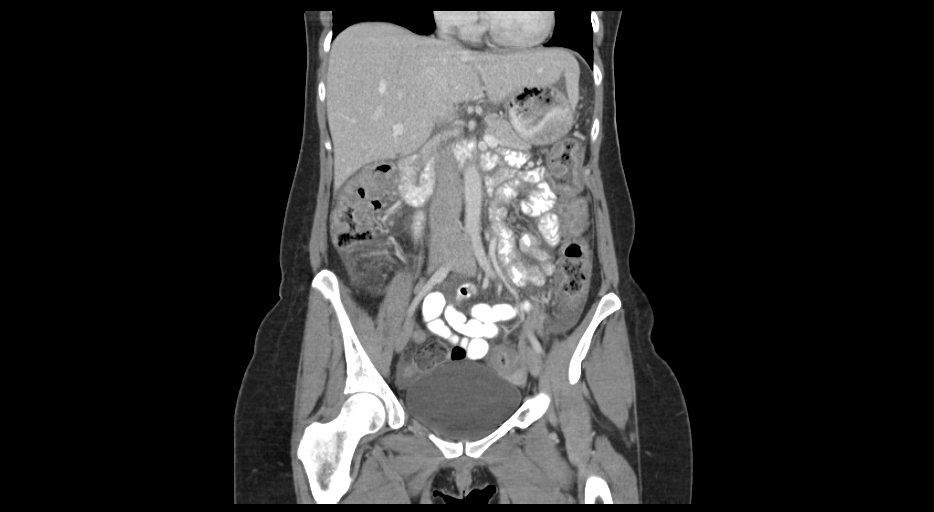
[im 47/85  soft-tissue]
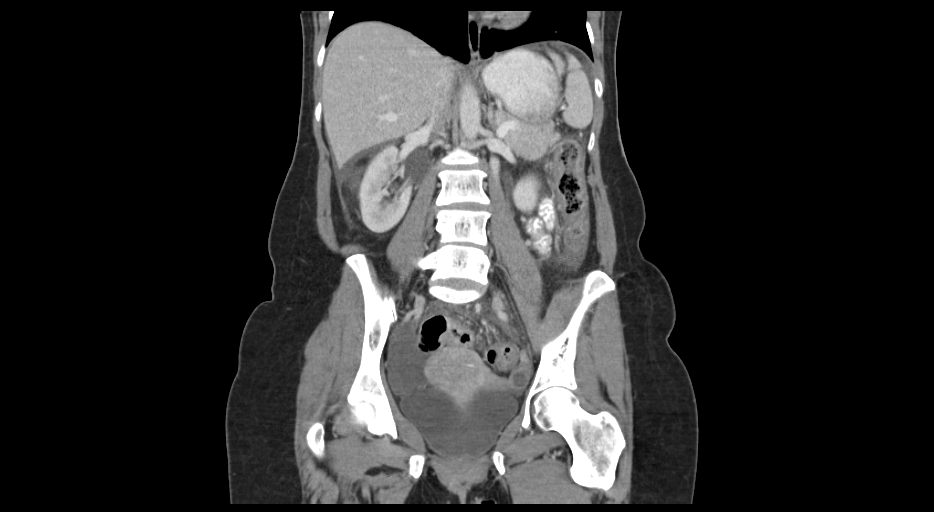

[14 of 46 positions shown; findings below may reference images not displayed]

FINDINGS: There is a small left pleural effusion with mild bibasilar
atelectatic change.

No focal liver lesions are identified. Gallbladder wall is not
appreciably thickened. There is no biliary duct dilatation.

Spleen, pancreas, and adrenals appear normal. Kidneys bilaterally
show no mass or hydronephrosis on either side. There is no renal or
ureteral calculus on either side. There is an extrarenal pelvis on
each side, an anatomic variant.

In the pelvis, the urinary bladder is midline with normal wall
thickness. There is an intrauterine device positioned within the
endometrium. There is an enhancing lesion in the posterior aspect of
the uterus measuring 2.6 x 2.2 x 1.7 cm consistent with a leiomyoma
within the uterus. A second presumed leiomyoma which does not show
similar enhancement is seen more superiorly in the posterior uterine
fundus measuring 2.2 x 2.3 x 1.8 cm.

There is moderate ascites in the pelvis with fluid tracking
superiorly to surround the inferior and lateral aspect of the liver
and to a lesser extent the spleen.

The appendix appears within normal limits.

There is mild wall thickening throughout the colon diffusely,
consistent with the known colitis. There is no evidence of
significant surrounding mesenteric thickening. There is no small
bowel wall thickening. No bowel obstruction. No free air or portal
venous air.

There is no demonstrable adenopathy. There are a few scattered
subcentimeter mesenteric lymph nodes which must be regarded as
nonspecific. No abscess is appreciable. There is no abdominal aortic
aneurysm. There are no blastic or lytic bone lesions.
IMPRESSION: There is relatively mild generalized colonic wall thickening
consistent with a degree of residual colitis. No bowel obstruction.
No free air. No periappendiceal region inflammation.

There is moderate ascites, primarily in the pelvis. Etiology for the
ascites is uncertain. It may be secondary to the colitis. An ovarian
cyst rupture could also be a cause of ascites of this nature.

Apparent uterine leiomyomas. Intrauterine device present within the
endometrium.

Small left pleural effusion with mild bibasilar atelectatic change.

## 2016-08-24 ENCOUNTER — Other Ambulatory Visit: Payer: Self-pay | Admitting: Cardiovascular Disease

## 2016-08-24 NOTE — Telephone Encounter (Signed)
Rx(s) sent to pharmacy electronically.  

## 2016-08-27 ENCOUNTER — Telehealth: Payer: Self-pay | Admitting: *Deleted

## 2016-08-27 NOTE — Telephone Encounter (Signed)
Pt called c/o cycle started yesterday, had Mirena IUD removed in May, takes estradiol 0.5 mg daily and progesterone 200 mg day 1-12 monthly( only took progesterone for a couple of days last month)  First time bleeding in 6 months, bleeding is not heavy, medium period flow, has cramping. Asked for recommendations? Watch for now? Please advise

## 2016-08-27 NOTE — Telephone Encounter (Signed)
Pt informed with the below note. 

## 2016-08-27 NOTE — Telephone Encounter (Signed)
Watch for now BUT take the medications as prescribed. Bleeding during time of Prometrium or shortly thereafter is not abnormal.

## 2016-09-12 ENCOUNTER — Telehealth: Payer: Self-pay | Admitting: Cardiovascular Disease

## 2016-09-12 MED ORDER — METOPROLOL SUCCINATE ER 50 MG PO TB24: 50.0000 mg | ORAL_TABLET | Freq: Every day | ORAL | 0 refills | Status: DC

## 2016-09-12 NOTE — Telephone Encounter (Signed)
Routed to refill pool.

## 2016-09-12 NOTE — Telephone Encounter (Signed)
Pt is calling because she needs a refill RX of [id number] 50mg  She is completely out of medication her next visit with the PA is on Monday Oct 23 Pharmacy:[facility] [address]

## 2016-09-12 NOTE — Telephone Encounter (Signed)
Called patient and informed refill sent to pharmacy. Pt verbalized thanks and understanding.

## 2016-09-17 ENCOUNTER — Encounter: Payer: Self-pay | Admitting: Physician Assistant

## 2016-09-17 ENCOUNTER — Ambulatory Visit (INDEPENDENT_AMBULATORY_CARE_PROVIDER_SITE_OTHER): Payer: BLUE CROSS/BLUE SHIELD | Admitting: Physician Assistant

## 2016-09-17 VITALS — BP 90/60 | HR 48 | Ht 64.0 in | Wt 142.2 lb

## 2016-09-17 DIAGNOSIS — R002 Palpitations: Secondary | ICD-10-CM

## 2016-09-17 DIAGNOSIS — I952 Hypotension due to drugs: Secondary | ICD-10-CM | POA: Diagnosis not present

## 2016-09-17 DIAGNOSIS — I493 Ventricular premature depolarization: Secondary | ICD-10-CM | POA: Diagnosis not present

## 2016-09-17 DIAGNOSIS — I471 Supraventricular tachycardia: Secondary | ICD-10-CM

## 2016-09-17 MED ORDER — METOPROLOL SUCCINATE ER 50 MG PO TB24: ORAL_TABLET | ORAL | 0 refills | Status: DC

## 2016-09-17 NOTE — Progress Notes (Signed)
Cardiology Office Note   Date:  09/17/2016   ID:  [name], DOB [birth year], MRN [id number]  PCP:  Terrance Mass, MD  Cardiologist: Dr Gwenlyn Found 05/2015 Rosaria Ferries, PA-C   Chief Complaint  Patient presents with  . Follow-up    OV AND MEDS REFILL    History of Present Illness: [name] is a 60 y.o. female with a history of PVCs, nl MV 2013, ST vs PSVT that improved on BB, nl echo.  [name] presents for Cardiology evaluation and management.  She has been having problems with fatigue. She also gets lightheaded occasionally. She is aware that her heart rate runs low, but was surprised to hear it was only 48 today.  She has not had chest pain. The metoprolol does a good job of controlling her palpitations and they have not bothered her very much. She remembers that before she Started on the metoprolol, she was having trouble sleeping and could not lie on her left side.  She is reasonably active. She is on estrogen replacement therapy along with progesterone, and is encouraged to discuss this with her provider.   The fatigue is bothering her. She does not feel like doing very much. She does not get chest pain with exertion, but she does get a little short of breath. She will get lightheaded if she has to stand for too long.   She is on several OTC supplements, including one for her thyroid. Her thyroid was last checked in July, and was okay. She is followed by rheumatologist in addition to her primary care M.D.   Past Medical History:  Diagnosis Date  . Arrhythmia 2013   PVCs,-monitor  . Arthritis   . Colon polyps 2008   BENIGN  . Depression    PMDD  . Hyperlipidemia   . Kidney infection   . Palpitations   . Raynaud's phenomenon   . Rotator cuff disorder    TEAR-    Past Surgical History:  Procedure Laterality Date  . DOPPLER ECHOCARDIOGRAPHY  08/14/2008   EF => 55% , no valvular pathology  . EVENT MONITOR  08/15/2012-08/28/2012   NSR with  PVC's ,PSVT vs Aflutter WITH 2:1  . HAND SURGERY Left   . NM MYOCAR PERF WALL MOTION  09/04/2012   EF 66% ,LV normal, exercise capcity 11 mets -completeing stae 3 and 1 minute into stage 4,developed some mild chest pain and throat  tightness caused to stop exercise. [facility] TM scor  2 - MOD RISK  . OOPHORECTOMY  2010   LAP RSO  . ROTATOR CUFF REPAIR    . THYROID LOBECTOMY     RIGHT    Medication Sig  . cetirizine (ZYRTEC) 10 MG tablet Take 10 mg by mouth daily.  . Cholecalciferol (VITAMIN D-3 PO) Take 1 tablet by mouth daily.  . Cranberry (SM CRANBERRY) 300 MG tablet Take 300 mg by mouth daily.  . Eluxadoline (VIBERZI) 100 MG TABS Take 2 tablets by mouth once a week. AS NEEDED  . estradiol (ESTRACE) 0.5 MG tablet Take 0.5 mg by mouth daily.  . Ferrous Sulfate 134 MG TABS Take 28 mg by mouth daily.   . Ginger, Zingiber officinalis, (GINGER PO) Take 1 capsule by mouth daily.  . metoprolol succinate (TOPROL-XL) 50 MG 24 hr tablet Take 1 tablet (50 mg total) by mouth daily. PLEASE KEEP APPT FOR ADDITIONAL REFILLS  . Misc Natural Products (TART CHERRY ADVANCED PO) Take 1 tablet by mouth daily.  Marland Kitchen  nitrofurantoin (MACRODANTIN) 100 MG capsule Take 1 capsule (100 mg total) by mouth 4 (four) times daily.  . nitrofurantoin (MACRODANTIN) 50 MG capsule Take with intercourse  . NON FORMULARY DHEA  . NON FORMULARY ASHWAGANDHA 450 MG  DAILY  . Omega-3 Fatty Acids (OMEGA 3 PO) Take 1 tablet by mouth daily.  . progesterone (PROMETRIUM) 100 MG capsule TAKE 1 CAPSULE BY MOUTH AT NIGHT  . sertraline (ZOLOFT) 50 MG tablet Take 75 mg by mouth daily.   . Thyroid (NATURE-THROID PO) Take 1 tablet by mouth daily.  . Thyroid (NATURE-THROID) 97.5 MG TABS Take 1 tablet by mouth daily.  . Turmeric 500 MG TABS Take 1 tablet by mouth daily.  . TURMERIC PO Take 1 capsule by mouth daily.  . valACYclovir (VALTREX) 1000 MG tablet Take 1 tablet (1,000 mg total) by mouth daily.  Marland Kitchen VITAMIN K PO Take 1 tablet by mouth daily.     Current Facility-Administered Medications  Medication Dose Route Frequency Provider Last Rate Last Dose  . levonorgestrel (MIRENA) 20 MCG/24HR IUD   Intrauterine Once Terrance Mass, MD        Allergies:   Lomotil [diphenoxylate]    Social History:  The patient  reports that she has never smoked. She has never used smokeless tobacco. She reports that she drinks alcohol. She reports that she does not use drugs.   Family History:  The patient's family history includes Brain cancer in her father; Heart attack (age of onset: 25) in her mother; Hypertension in her father and mother; Lung cancer in her father; Migraines in her child; Osteoporosis in her father.    ROS:  Please see the history of present illness. All other systems are reviewed and negative.    PHYSICAL EXAM: VS:  BP 90/60   Pulse (!) 48   Ht 5\' 4"  (1.626 m)   Wt 142 lb 3.2 oz (64.5 kg)   LMP 03/18/2015 (Approximate)   BMI 24.41 kg/m  , BMI Body mass index is 24.41 kg/m. GEN: Well nourished, well developed, female in no acute distress  HEENT: normal for age  Neck: no JVD, no carotid bruit, no masses Cardiac: RRR; no murmur, no rubs, or gallops Respiratory:  clear to auscultation bilaterally, normal work of breathing GI: soft, nontender, nondistended, + BS MS: no deformity or atrophy; no edema; distal pulses are 2+ in all 4 extremities   Skin: warm and dry, no rash Neuro:  Strength and sensation are intact Psych: euthymic mood, full affect   EKG:  EKG is ordered today. The ekg ordered today demonstrates sinus bradycardia, heart rate 48, no acute ischemic changes, small Q waves were previously present   Recent Labs: 06/05/2016: BUN 11; Creat 0.76; Hemoglobin 12.0; Platelets 289; Potassium 4.0; Sodium 140; TSH 0.94 07/03/2016: ALT 31    Lipid Panel    Component Value Date/Time   CHOL 182 06/05/2016 0929   TRIG 139 06/05/2016 0929   HDL 61 06/05/2016 0929   CHOLHDL 3.0 06/05/2016 0929   VLDL 28 06/05/2016  0929   LDLCALC 93 06/05/2016 0929     Wt Readings from Last 3 Encounters:  09/17/16 142 lb 3.2 oz (64.5 kg)  06/05/16 143 lb (64.9 kg)  02/23/16 136 lb (61.7 kg)     Other studies Reviewed: Additional studies/ records that were reviewed today include: Office notes and testing.  ASSESSMENT AND PLAN:  1.  PVCs: We had a long discussion about this. She is concerned that if she cuts back on  the metoprolol, her PVCs will come back and be bad again. However, she recognizes that part of her other symptoms may caused the beta blocker. Therefore, she is agreeable to decreasing her metoprolol from 50 down to 25 mg daily. She is to let us know how she does on the lower dose.  If she has additional palpitations on the lower dose of metoprolol, we can try another one such as propranolol or carvedilol to see if those work better for her.  2. Hypotension: If her blood pressure does not come up when she decreases the metoprolol, we will have to look at decreasing it further or stopping it completely. The low blood pressure may be responsible for her symptoms such as fatigue and poor endurance.   Current medicines are reviewed at length with the patient today.  The patient does not have concerns regarding medicines.  The following changes have been made: Decrease metoprolol  Labs/ tests ordered today include:  No orders of the defined types were placed in this encounter.    Disposition:   FU with Dr. Gwenlyn Found  Signed, Rosaria Ferries, PA-C  09/17/2016 4:06 PM    [facility] Phone: [phone]; Fax: [phone]  This note was written with the assistance of speech recognition software. Please excuse any transcriptional errors.

## 2016-09-17 NOTE — Patient Instructions (Addendum)
Medication Instructions:    START TAKING METOPROLOL TOPROL XL  25 MG ONCE A DAY  ( CONTACT OFFICE IF DOSAGE WORKS BETTER  OR  DISCUSS DOSAGE CHANGES)   If you need a refill on your cardiac medications before your next appointment, please call your pharmacy.  Labwork: NONE ORDER TODAY    Testing/Procedures: NONE ORDER TODAY    Follow-Up:  Your physician wants you to follow-up in: [place] will receive a reminder letter in the mail two months in advance. If you don't receive a letter, please call our office to schedule the follow-up appointment.     Any Other Special Instructions Will Be Listed Below (If Applicable).

## 2016-09-20 ENCOUNTER — Encounter: Payer: Self-pay | Admitting: Physician Assistant

## 2016-10-03 ENCOUNTER — Telehealth: Payer: Self-pay | Admitting: Cardiovascular Disease

## 2016-10-03 MED ORDER — METOPROLOL SUCCINATE ER 25 MG PO TB24
ORAL_TABLET | ORAL | 11 refills | Status: DC
Start: 2016-10-03 — End: 2017-03-14

## 2016-10-03 NOTE — Telephone Encounter (Signed)
New message   Switched metoprolol to different dosage  Needs new RX for new dosage  [facility] on [address]

## 2016-10-03 NOTE — Telephone Encounter (Signed)
Spoke with pt states that the new dose of her Metoprolol(25mg ) is working well for her she is having no palpitations so she will need a refill to her [facility]  Refill sent

## 2016-11-01 DIAGNOSIS — M170 Bilateral primary osteoarthritis of knee: Secondary | ICD-10-CM | POA: Insufficient documentation

## 2016-11-01 DIAGNOSIS — M19071 Primary osteoarthritis, right ankle and foot: Secondary | ICD-10-CM | POA: Insufficient documentation

## 2016-11-01 DIAGNOSIS — M19042 Primary osteoarthritis, left hand: Secondary | ICD-10-CM

## 2016-11-01 DIAGNOSIS — M19041 Primary osteoarthritis, right hand: Secondary | ICD-10-CM | POA: Insufficient documentation

## 2016-11-01 DIAGNOSIS — M19072 Primary osteoarthritis, left ankle and foot: Secondary | ICD-10-CM | POA: Insufficient documentation

## 2016-11-01 DIAGNOSIS — M0230 Reiter's disease, unspecified site: Secondary | ICD-10-CM | POA: Insufficient documentation

## 2016-11-01 NOTE — Progress Notes (Signed)
Office Visit Note  Patient: [name]             Date of Birth: [birth year]           MRN: [id number]             PCP: Terrance Mass, MD Referring: Terrance Mass, MD Visit Date: 11/05/2016 Occupation: _0 @    Subjective:  No chief complaint on file. Follow-up on OA of the hands feet and knee joint and left ankle joint  History of Present Illness: [name] is a 60 y.o. female  Last seen 03/29/2016. Patient reports that her left [facility] surgery through Dr. Stann Mainland goal January 2017 is doing really well.  She has ongoing pain to the right [facility] joint at times. She does have a brace for this but she forgets to wear it at times. When she does worry that does help.  She also has occasional left ankle pain especially when she plays tennis. She wears an old brace at times. Its multiple years old and I advised the patient that she needs new brace for proper support.  Her AST and ALT have been elevated her Dr. Sandrea Hughs recent labs. As a result he suggested that patient stop using omega-3 Fish oil.  Otherwise patient is doing well. He uses Voltaren gel when necessary joint pain.   Activities of Daily Living:  Patient reports morning stiffness for 15 minutes.   Patient Denies nocturnal pain.  Difficulty dressing/grooming: Denies Difficulty climbing stairs: Denies Difficulty getting out of chair: Denies Difficulty using hands for taps, buttons, cutlery, and/or writing: Denies   No Rheumatology ROS completed.   PMFS History:  Patient Active Problem List   Diagnosis Date Noted  . Reactive arthritis ([place]) 11/01/2016  . Primary osteoarthritis of both knees 11/01/2016  . Primary osteoarthritis of both hands 11/01/2016  . Primary osteoarthritis of both feet 11/01/2016  . Leukocytosis   . HTN (hypertension) 05/24/2015  . Hyperlipidemia 05/24/2015  . C. difficile colitis 05/24/2015  . Hypokalemia 05/24/2015  . Hyperglycemia 05/24/2015  . Pain of right thigh  10/20/2014  . Other iron deficiency anemias 08/05/2014  . Palpitations 08/21/2013  . Personal history of endometriosis 02/04/2013  . Fibroid uterus 02/04/2013  . Varicose veins of lower extremities with other complications 16/96/7893  . Varicosities of leg 03/10/2012  . Multinodular goiter (nontoxic) 01/22/2012    Past Medical History:  Diagnosis Date  . Arrhythmia 2013   PVCs,-monitor  . Arthritis   . Colon polyps 2008   BENIGN  . Depression    PMDD  . Hyperlipidemia   . Kidney infection   . Palpitations   . Raynaud's phenomenon   . Rotator cuff disorder    TEAR-    Family History  Problem Relation Age of Onset  . Osteoporosis Father   . Hypertension Father   . Lung cancer Father   . Brain cancer Father   . Hypertension Mother   . Heart attack Mother 54  . Migraines Child   . Heart attack Maternal Grandfather    Past Surgical History:  Procedure Laterality Date  . DOPPLER ECHOCARDIOGRAPHY  08/14/2008   EF => 55% , no valvular pathology  . EVENT MONITOR  08/15/2012-08/28/2012   NSR with PVC's ,PSVT vs Aflutter WITH 2:1  . HAND SURGERY Left   . NM MYOCAR PERF WALL MOTION  09/04/2012   EF 66% ,LV normal, exercise capcity 11 mets -completeing stae 3 and 1 minute into stage 4,developed some mild  chest pain and throat  tightness caused to stop exercise. [facility] TM scor  2 - MOD RISK  . OOPHORECTOMY  2010   LAP RSO  . ROTATOR CUFF REPAIR    . THYROID LOBECTOMY     RIGHT   Social History   Social History Narrative  . No narrative on file     Objective: Vital Signs: BP 106/65 (BP Location: Left Arm, Patient Position: Sitting, Cuff Size: Large)   Pulse (!) 57   Resp 14   Ht _0  (1.626 m)   Wt 141 lb (64 kg)   LMP 03/18/2015 (Approximate)   BMI 24.20 kg/m    Physical Exam   Musculoskeletal Exam:  Full range of motion of all joints Grip strength is equal and strong bilaterally Fiber myalgia tender points are all absent  CDAI Exam: No CDAI exam completed.   No synovitis on examination  Investigation: Findings:    :  July 2016:  CBC shows platelet count of 581 which was elevated.  Comprehensive metabolic panel was normal, and sed rate was elevated at 45.  She also had a hepatitis panel, G6PD, rheumatoid factor, CCP, ANA, and HLA-B27 which were all negative.    Imaging: No results found.  Speciality Comments: No specialty comments available.    Procedures:  No procedures performed Allergies: Lomotil [diphenoxylate]   Assessment / Plan:     Reactive arthritis, onset after C diff infection  Visit Diagnoses: Reactive arthritis ([place]) - all autoimmune w-u negative july 2016;  Primary osteoarthritis of both hands - left cmc surgery via Dr Burney Gauze Jan 2017 worked well;right cmc pain on occassion --> uses brace PRN (forgets at times).  Primary osteoarthritis of both feet  Primary osteoarthritis of both knees - aware of visco and will ask if needed when kj pain is ongoing and problematic.  Pain in left ankle and joints of left foot - occasional pain; uses brace (old and will get Rx from pcp for new brace).  Hyperglycemia  Hypokalemia  Advised patient to get a new left ankle brace. Her current brace is very old.  Advised patient to continue to use CMC brace when necessary. She forgets to use the right CMC brace at times.  Advised the patient to use Voltaren gel to various joints when she is having pain and discomfort  Patient wants to return to clinic in about 8 months for follow-up and sooner for there is any problems  Patient autoimmune workup was negative in July 2016 and does not require any new autoimmune workup since she is not having any flare or any evidence of reactive arthritis or psoriasis.  Patient's Raynaud's is not active at this time.  Orders: No orders of the defined types were placed in this encounter.  No orders of the defined types were placed in this encounter.   Face-to-face time spent with patient was  30 minutes. 50% of time was spent in counseling and coordination of care.  Follow-Up Instructions: Return in about 8 months (around 07/06/2017) for oa hands, left ankle pain, left cmc pain, .   Eliezer Lofts, PA-C

## 2016-11-05 ENCOUNTER — Encounter: Payer: Self-pay | Admitting: Rheumatology

## 2016-11-05 ENCOUNTER — Ambulatory Visit (INDEPENDENT_AMBULATORY_CARE_PROVIDER_SITE_OTHER): Payer: BLUE CROSS/BLUE SHIELD | Admitting: Rheumatology

## 2016-11-05 VITALS — BP 106/65 | HR 57 | Resp 14 | Ht 64.0 in | Wt 141.0 lb

## 2016-11-05 DIAGNOSIS — M19042 Primary osteoarthritis, left hand: Secondary | ICD-10-CM

## 2016-11-05 DIAGNOSIS — M19072 Primary osteoarthritis, left ankle and foot: Secondary | ICD-10-CM

## 2016-11-05 DIAGNOSIS — M0230 Reiter's disease, unspecified site: Secondary | ICD-10-CM

## 2016-11-05 DIAGNOSIS — R739 Hyperglycemia, unspecified: Secondary | ICD-10-CM

## 2016-11-05 DIAGNOSIS — M19071 Primary osteoarthritis, right ankle and foot: Secondary | ICD-10-CM

## 2016-11-05 DIAGNOSIS — M25572 Pain in left ankle and joints of left foot: Secondary | ICD-10-CM

## 2016-11-05 DIAGNOSIS — E876 Hypokalemia: Secondary | ICD-10-CM

## 2016-11-05 DIAGNOSIS — M170 Bilateral primary osteoarthritis of knee: Secondary | ICD-10-CM

## 2016-11-05 DIAGNOSIS — M19041 Primary osteoarthritis, right hand: Secondary | ICD-10-CM | POA: Diagnosis not present

## 2016-11-08 DIAGNOSIS — L738 Other specified follicular disorders: Secondary | ICD-10-CM | POA: Diagnosis not present

## 2016-11-08 DIAGNOSIS — L821 Other seborrheic keratosis: Secondary | ICD-10-CM | POA: Diagnosis not present

## 2016-12-03 DIAGNOSIS — H5712 Ocular pain, left eye: Secondary | ICD-10-CM | POA: Diagnosis not present

## 2016-12-03 DIAGNOSIS — T1512XA Foreign body in conjunctival sac, left eye, initial encounter: Secondary | ICD-10-CM | POA: Diagnosis not present

## 2016-12-06 DIAGNOSIS — M1811 Unilateral primary osteoarthritis of first carpometacarpal joint, right hand: Secondary | ICD-10-CM | POA: Diagnosis not present

## 2016-12-26 ENCOUNTER — Telehealth: Payer: Self-pay | Admitting: *Deleted

## 2016-12-26 DIAGNOSIS — E079 Disorder of thyroid, unspecified: Secondary | ICD-10-CM

## 2016-12-26 DIAGNOSIS — E042 Nontoxic multinodular goiter: Secondary | ICD-10-CM

## 2016-12-26 NOTE — Telephone Encounter (Signed)
She has complex thyroid disease which should be managed by endocrinologist. I would give her Dr. Renne Crigler name  or schedule an appointment for her.

## 2016-12-26 NOTE — Telephone Encounter (Signed)
Pt takes nature-throid 97.5 mg tablet not longer seeing the provider that originally prescribed medication. You checked her TSH level in July 2017. Pt asked can you start managing her TSH and refill medication? Please advise

## 2016-12-27 ENCOUNTER — Ambulatory Visit (INDEPENDENT_AMBULATORY_CARE_PROVIDER_SITE_OTHER): Payer: BLUE CROSS/BLUE SHIELD | Admitting: *Deleted

## 2016-12-27 ENCOUNTER — Other Ambulatory Visit: Payer: BLUE CROSS/BLUE SHIELD

## 2016-12-27 DIAGNOSIS — Z23 Encounter for immunization: Secondary | ICD-10-CM

## 2016-12-27 DIAGNOSIS — R945 Abnormal results of liver function studies: Secondary | ICD-10-CM

## 2016-12-27 DIAGNOSIS — M8580 Other specified disorders of bone density and structure, unspecified site: Secondary | ICD-10-CM

## 2016-12-27 DIAGNOSIS — R7989 Other specified abnormal findings of blood chemistry: Secondary | ICD-10-CM

## 2016-12-27 LAB — AST: AST: 26 U/L (ref 10–35)

## 2016-12-27 LAB — ALT: ALT: 26 U/L (ref 6–29)

## 2016-12-27 MED ORDER — THYROID 90 MG PO TABS: 90.0000 mg | ORAL_TABLET | Freq: Every day | ORAL | 0 refills | Status: DC

## 2016-12-27 MED ORDER — THYROID 97.5 MG PO TABS: 1.0000 | ORAL_TABLET | Freq: Every day | ORAL | 0 refills | Status: DC

## 2016-12-27 NOTE — Telephone Encounter (Signed)
Rx sent, referral placed for endo, appointment at [facility] they will contact pt to schedule.

## 2016-12-27 NOTE — Telephone Encounter (Signed)
Refill for 30 days okay

## 2016-12-27 NOTE — Telephone Encounter (Signed)
For 30 days

## 2016-12-27 NOTE — Telephone Encounter (Signed)
Pharmacy sent fax stating that the nature-throid 97.5 mg is on manufacture back order for months, they asked if Rx could be switched to armour thyroid 90 mg? Please advise

## 2016-12-27 NOTE — Telephone Encounter (Signed)
Pt is very low on Rx will run out this weekend, can she have 30 day until she can get in to see endocrinologist.?

## 2016-12-28 ENCOUNTER — Encounter: Payer: Self-pay | Admitting: Women's Health

## 2016-12-28 LAB — VITAMIN D 25 HYDROXY (VIT D DEFICIENCY, FRACTURES): Vit D, 25-Hydroxy: 69 ng/mL (ref 30–100)

## 2016-12-28 NOTE — Telephone Encounter (Signed)
Rx was sent yesterday (12/28/16)

## 2017-01-30 ENCOUNTER — Ambulatory Visit (INDEPENDENT_AMBULATORY_CARE_PROVIDER_SITE_OTHER): Payer: BLUE CROSS/BLUE SHIELD | Admitting: Internal Medicine

## 2017-01-30 ENCOUNTER — Encounter: Payer: Self-pay | Admitting: Internal Medicine

## 2017-01-30 VITALS — BP 102/60 | HR 61 | Ht 64.0 in | Wt 136.0 lb

## 2017-01-30 DIAGNOSIS — E042 Nontoxic multinodular goiter: Secondary | ICD-10-CM | POA: Diagnosis not present

## 2017-01-30 DIAGNOSIS — E890 Postprocedural hypothyroidism: Secondary | ICD-10-CM | POA: Diagnosis not present

## 2017-01-30 LAB — T3, FREE: T3 FREE: 4.7 pg/mL — AB (ref 2.3–4.2)

## 2017-01-30 LAB — T4, FREE: Free T4: 0.79 ng/dL (ref 0.60–1.60)

## 2017-01-30 LAB — TSH: TSH: 0.06 u[IU]/mL — AB (ref 0.35–4.50)

## 2017-01-30 NOTE — Patient Instructions (Addendum)
Please stop at the lab.  Please continue Armour 90 mg daily.  Take the thyroid hormone every day, with water, at least 30 minutes before breakfast, separated by at least 4 hours from: - acid reflux medications - calcium - iron - multivitamins  Please come back for a follow-up appointment in 6 months.

## 2017-01-30 NOTE — Progress Notes (Signed)
Patient ID: [name], female   DOB: 02/21/1956, 61 y.o.   MRN: [id number]    HPI  [name] is a 61 y.o.-year-old female, referred by Dr. Uvaldo Rising, for management of MNG and postablative hypothyroidism. She saw Dr. Chalmers Cater and Dr. Tye Savoy previously.   Pt. has a long h/o thyroid nodules, s/p R thyroidectomy in 2009. She then developed hyperthyroidism >> had RAI tx in 06/2012. She continues to have L thyroid nodules >> largest Bx'ed in 2005, 2009, 2010, with benign results.  Latest thyroid ultrasound (05/28/2012): Right thyroid lobe:  Surgically absent. Left thyroid lobe:  Measures 4.7 x 2.1 x 2.1 cm (stable) Isthmus:  Surgically absent  Focal nodules:   - Sonographically apparent left thyroid lobe nodules include a 3.7 x 1.7 x 2.0 cm heterogeneous nodule with hypervascularity and faint calcifications (formerly 3.6 x 1.7 x 2.0 cm) - a 1.4 x 0.5 x 1.0 cm heterogeneous mid pole nodule (stable) - a 0.7 x 0.5 x 1.0 cm upper pole solid nodule (stable).  Lymphadenopathy:  None visualized.  IMPRESSION: 1.  Stable appearance of thyroid nodules.  The dominant left lower pole nodule has been biopsied more than one time, and characterized as hyperplastic/nonmalignant.  Although prior biopsies have been negative, the lesion does have some internal characteristics such as microcalcifications which probably warrant continued observation.  She has been dx with hypothyroidism in 2013 >> on Naturethroid 97.5 mg >> Armour 90 mg - 1 mo ago.  She takes the thyroid hormone: - fasting - with water - separated by 1-2h from b'fast  - no calcium, iron, PPIs, multivitamins  On Zoloft, E2, vitamin D and C.  I reviewed pt's thyroid tests: Lab Results  Component Value Date   TSH 0.94 06/05/2016   TSH 1.497 05/24/2015   TSH 0.213 (L) 02/19/2014    Pt describes: - no weight gain - + fatigue - no cold intolerance - no depression - no constipation, + occas. diarrhea - no dry skin -  no hair loss (on Biotin) - + anxiety  Pt denies feeling nodules in neck, hoarseness, dysphagia/odynophagia, SOB with lying down.  She has + FH of thyroid disorders in: PGM >> goiter. No FH of thyroid cancer.  No h/o radiation tx to head or neck, except RAI tx. No recent use of iodine supplements.  Pt. also has a history of C diff colitis in 2015. On Viberzi..  ROS:  Constitutional: + see HPI Eyes: no blurry vision, no xerophthalmia ENT: no sore throat, + see HPI Cardiovascular: no CP/SOB/palpitations/leg swelling Respiratory: no cough/SOB Gastrointestinal: no N/V/D/C Musculoskeletal: no muscle/+ joint aches (hands) Skin: no rashes Neurological: no tremors/numbness/tingling/dizziness Psychiatric: no depression/+ anxiety  Past Medical History:  Diagnosis Date  . Arrhythmia 2013   PVCs,-monitor  . Arthritis   . Colon polyps 2008   BENIGN  . Depression    PMDD  . Hyperlipidemia   . Kidney infection   . Palpitations   . Raynaud's phenomenon   . Rotator cuff disorder    TEAR-   Past Surgical History:  Procedure Laterality Date  . DOPPLER ECHOCARDIOGRAPHY  08/14/2008   EF => 55% , no valvular pathology  . EVENT MONITOR  08/15/2012-08/28/2012   NSR with PVC's ,PSVT vs Aflutter WITH 2:1  . HAND SURGERY Left   . NM MYOCAR PERF WALL MOTION  09/04/2012   EF 66% ,LV normal, exercise capcity 11 mets -completeing stae 3 and 1 minute into stage 4,developed some mild chest pain and throat  tightness caused to stop exercise. [facility] TM scor  2 - MOD RISK  . OOPHORECTOMY  2010   LAP RSO  . ROTATOR CUFF REPAIR    . THYROID LOBECTOMY     RIGHT   Social History   Social History  . Marital status: Married    Spouse name: N/A  . Number of children: 2   Occupational History  . Futures trader   Social History Main Topics  . Smoking status: Never Smoker  . Smokeless tobacco: Never Used  . Alcohol use      Comment: two glasses of wine 3x a week  . Drug use: No   Current  Outpatient Prescriptions on File Prior to Visit  Medication Sig Dispense Refill  . cetirizine (ZYRTEC) 10 MG tablet Take 10 mg by mouth daily.    . Cholecalciferol (VITAMIN D-3 PO) Take 1 tablet by mouth daily.    . Eluxadoline (VIBERZI) 100 MG TABS Take 2 tablets by mouth once a week. AS NEEDED    . estradiol (ESTRACE) 0.5 MG tablet Take 0.5 mg by mouth daily.    . NON FORMULARY ASHWAGANDHA 450 MG  DAILY    . Probiotic Product (PROBIOTIC-10 PO) Take by mouth.    . progesterone (PROMETRIUM) 100 MG capsule TAKE 1 CAPSULE BY MOUTH AT NIGHT  0  . sertraline (ZOLOFT) 50 MG tablet Take 75 mg by mouth daily.   0  . thyroid (ARMOUR) 90 MG tablet Take 1 tablet (90 mg total) by mouth daily. 30 tablet 0  . Turmeric 500 MG TABS Take 1 tablet by mouth daily.    . valACYclovir (VALTREX) 1000 MG tablet Take 1 tablet (1,000 mg total) by mouth daily. (Patient taking differently: Take 1,000 mg by mouth as needed. ) 30 tablet 3  . Cranberry (SM CRANBERRY) 300 MG tablet Take 300 mg by mouth daily.    . Ferrous Sulfate 134 MG TABS Take 28 mg by mouth daily.     . Ginger, Zingiber officinalis, (GINGER PO) Take 1 capsule by mouth daily.    . metoprolol succinate (TOPROL-XL) 25 MG 24 hr tablet One tablet daily (Patient not taking: Reported on 11/05/2016) 30 tablet 11  . Misc Natural Products (TART CHERRY ADVANCED PO) Take 1 tablet by mouth daily.    . Multiple Vitamin (MULTIVITAMIN) capsule Take 1 capsule by mouth daily.    . nitrofurantoin (MACRODANTIN) 50 MG capsule Take with intercourse (Patient not taking: Reported on 01/30/2017) 30 capsule 0  . Omega-3 Fatty Acids (OMEGA 3 PO) Take 1 tablet by mouth daily.    . Thyroid (NATURE-THROID PO) Take 1 tablet by mouth daily.    . Thyroid (NATURE-THROID) 97.5 MG TABS Take 1 tablet (97.5 mg total) by mouth daily. (Patient not taking: Reported on 01/30/2017) 30 tablet 0  . UNABLE TO FIND 600 mg. EDTA- CALCIUM DISODIUM CHELATION FORMULA    . VITAMIN K PO Take 1 tablet by  mouth daily.     Current Facility-Administered Medications on File Prior to Visit  Medication Dose Route Frequency Provider Last Rate Last Dose  . levonorgestrel (MIRENA) 20 MCG/24HR IUD   Intrauterine Once Terrance Mass, MD       Allergies  Allergen Reactions  . Lomotil [Diphenoxylate] Hives    SWELLING, ITCHING    Family History  Problem Relation Age of Onset  . Osteoporosis Father   . Hypertension Father   . Lung cancer Father   . Brain cancer Father   . Hypertension Mother   .  Heart attack Mother 75  . Migraines Child   . Heart attack Maternal Grandfather    PE: BP 102/60 (BP Location: Left Arm, Patient Position: Sitting)   Pulse 61   Ht 5\' 4"  (1.626 m)   Wt 136 lb (61.7 kg)   LMP 03/22/2015   SpO2 97%   BMI 23.34 kg/m  Wt Readings from Last 3 Encounters:  01/30/17 136 lb (61.7 kg)  11/05/16 141 lb (64 kg)  09/17/16 142 lb 3.2 oz (64.5 kg)   Constitutional: normal weight, in NAD Eyes: PERRLA, EOMI, no exophthalmos ENT: moist mucous membranes, no thyromegaly, thyroidectomy scar healed, no cervical lymphadenopathy Cardiovascular: RRR, No MRG Respiratory: CTA B Gastrointestinal: abdomen soft, NT, ND, BS+ Musculoskeletal: no deformities, strength intact in all 4 Skin: moist, warm, no rashes Neurological: no tremor with outstretched hands, DTR normal in all 4  ASSESSMENT: 1. Postablative Hypothyroidism  2. Thyroid nodules  PLAN:  1. Patient with long-standing hypothyroidism, on Armour 90 mg daily, changed from Naturethroid 97.5 mg, a month ago, due to Producer, television/film/video. She is feeling well on Armour and has no complaints. She has no problem continuing with Armour. - she appears euthyroid.  - We reviewed together her latest TFTs, which were normal, in 05/2016. We'll recheck a TSH, free T4, free T3 today. - We discussed about correct intake of the thyroid medication, fasting, with water, separated by at least 30 minutes from breakfast, and separated by more than  4 hours from calcium, iron, multivitamins, acid reflux medications (PPIs). She is taking it correctly. - If labs today are abnormal, she will need to return in ~6 weeks for repeat labs - Otherwise, I will see her back in 6 months  2. Thyroid nodules - She has a history of right thyroidectomy, and has 3 dominant nodules in the left thyroid lobe. The largest nodule has been biopsied 3 times, lasting 2010, with benign results. I advised the patient that no biopsies needed anymore for this nodule. The other 2 nodules are smaller, the largest being 1.4 cm. These were stable on serial ultrasounds. She has no neck compression symptoms. - I suggested to follow her clinically for the nodules and repeat an ultrasound if she develops neck compression symptoms she agrees with the plan.  Needs refills! Office Visit on 01/30/2017  Component Date Value Ref Range Status  . Free T4 01/30/2017 0.79  0.60 - 1.60 ng/dL Final   Comment: Specimens from patients who are undergoing biotin therapy and /or ingesting biotin supplements may contain high levels of biotin.  The higher biotin concentration in these specimens interferes with this Free T4 assay.  Specimens that contain high levels  of biotin may cause false high results for this Free T4 assay.  Please interpret results in light of the total clinical presentation of the patient.    . T3, Free 01/30/2017 4.7* 2.3 - 4.2 pg/mL Final  . TSH 01/30/2017 0.06* 0.35 - 4.50 uIU/mL Final   Armour dose is too high >> will advise her to alternate: 60 with 90 mg every other day   Philemon Kingdom, MD PhD [facility] Endocrinology

## 2017-01-31 MED ORDER — THYROID 90 MG PO TABS: 90.0000 mg | ORAL_TABLET | ORAL | 1 refills | Status: DC

## 2017-01-31 MED ORDER — ARMOUR THYROID 60 MG PO TABS: 60.0000 mg | ORAL_TABLET | ORAL | 1 refills | Status: DC

## 2017-03-07 ENCOUNTER — Telehealth: Payer: Self-pay | Admitting: *Deleted

## 2017-03-07 NOTE — Telephone Encounter (Signed)
Pt called with questions regarding spotting after taking progesterone. Pt was confused about which dose of progesterone she should be taking. Per Dr.Fernandez note at annual on 06/05/16 pt is taking estradiol 0.5 mg daily and progesterone 200 mg day 1-12 of each month. Pt said she had took 2 pills or progesterone, I explained to pt directions on Rx. Pt is going to check ( pills were at home) pt will confirm the dose, and take the way prescribed pt light spotting, not heavy, will watch for now and follow up if continues.

## 2017-03-08 ENCOUNTER — Telehealth: Payer: Self-pay | Admitting: *Deleted

## 2017-03-08 NOTE — Telephone Encounter (Signed)
Pt informed with the below note, transferred to front desk.  

## 2017-03-08 NOTE — Telephone Encounter (Signed)
Tell her that she should be taking her estradiol 0.5 mg daily and the Prometrium 200 mg from day 1 through 12. Because of the bleeding that she's having a would like for her to come to the office for an endometrial biopsy. I can see her this afternoon we can work currently in.

## 2017-03-08 NOTE — Telephone Encounter (Signed)
Pt takes estradiol 0.5 mg tablet and progesterone 20 mg day 1-12 monthly, states today a cycle started, medium flow, cramps. Please advise

## 2017-03-14 ENCOUNTER — Ambulatory Visit (INDEPENDENT_AMBULATORY_CARE_PROVIDER_SITE_OTHER): Payer: BLUE CROSS/BLUE SHIELD | Admitting: Gynecology

## 2017-03-14 ENCOUNTER — Encounter: Payer: Self-pay | Admitting: Gynecology

## 2017-03-14 ENCOUNTER — Ambulatory Visit (INDEPENDENT_AMBULATORY_CARE_PROVIDER_SITE_OTHER): Payer: BLUE CROSS/BLUE SHIELD

## 2017-03-14 VITALS — BP 112/70 | Ht 64.0 in | Wt 135.6 lb

## 2017-03-14 DIAGNOSIS — D251 Intramural leiomyoma of uterus: Secondary | ICD-10-CM

## 2017-03-14 DIAGNOSIS — N950 Postmenopausal bleeding: Secondary | ICD-10-CM | POA: Diagnosis not present

## 2017-03-14 DIAGNOSIS — N840 Polyp of corpus uteri: Secondary | ICD-10-CM | POA: Diagnosis not present

## 2017-03-14 NOTE — Patient Instructions (Signed)

## 2017-03-14 NOTE — Progress Notes (Signed)
   Patient is a 61 year old who last year was started on Estrace 0.5 mg daily with the addition of Prometrium 200 mg for 12 days of the month. Patient had 100 mg of Prometrium and was taking this and having no menstrual bleeding and then when she increased it  as instructed to 200 mg daily for 12 days of the month she bled for 6 days and was concerned. She is otherwise asymptomatic not bleeding today.  Exam: Abdomen: Soft nontender no rebound or guarding Pelvic: Rebeca Alert Skene was within normal limits Vagina: No gross lesions on inspection Cervix: No lesions or discharge 's anteverted upper limits of normal nontender Adnexa: No large masses palpated Rectal exam: Not done  Patient was counseled for endometrial biopsy as a result of her postmenopausal bleeding. The cervix was cleansed with Betadine solution. A single-tooth tenaculum was placed on the anterior cervical lip. A sterile Pipelle was introduced into the uterine cavity. The uterus sounded to 7 cm and tissue was obtained and submitted for histological evaluation. Patient tolerated procedure well.  Patient had an ultrasound today and the following was reported: Uterus measured 8.2 x 5.6 x 6.0 cm with endometrial stripe of 3.9 mm. 2 small intramural myomas measuring 28 x 19 mm, and 17 x 20 mm. Fluid-filled endometrium earlier this morning she had endometrial biopsy. Right adnexa negative. Left ovary normal. With echo and microcalcification. Fluid-filled left adnexa cul-de-sac 43 x 13 x 19 mm. No apparent adnexal masses on the right or left adnexa.     Assessment/plan: Postmenopausal patient on hormone replacement therapy isolated bleeding episode after completing her Prometrium after dose had been increased to 200 mg daily which is a correct dose. Endometrial biopsy done today pathology report pending. If normal patient will resume the normal regimen and return back in July of this year for her annual exam. Will follow-up with  ultrasound when she returned back in July.

## 2017-03-19 ENCOUNTER — Other Ambulatory Visit: Payer: Self-pay | Admitting: Gynecology

## 2017-03-19 DIAGNOSIS — N840 Polyp of corpus uteri: Secondary | ICD-10-CM

## 2017-04-01 ENCOUNTER — Other Ambulatory Visit: Payer: BLUE CROSS/BLUE SHIELD

## 2017-04-01 ENCOUNTER — Ambulatory Visit: Payer: BLUE CROSS/BLUE SHIELD | Admitting: Gynecology

## 2017-04-02 ENCOUNTER — Other Ambulatory Visit (INDEPENDENT_AMBULATORY_CARE_PROVIDER_SITE_OTHER): Payer: BLUE CROSS/BLUE SHIELD

## 2017-04-02 ENCOUNTER — Telehealth: Payer: Self-pay | Admitting: *Deleted

## 2017-04-02 ENCOUNTER — Encounter: Payer: Self-pay | Admitting: *Deleted

## 2017-04-02 DIAGNOSIS — E890 Postprocedural hypothyroidism: Secondary | ICD-10-CM

## 2017-04-02 LAB — T3, FREE: T3, Free: 4.1 pg/mL (ref 2.3–4.2)

## 2017-04-02 LAB — TSH: TSH: 0.3 u[IU]/mL — ABNORMAL LOW (ref 0.35–4.50)

## 2017-04-02 LAB — T4, FREE: Free T4: 0.57 ng/dL — ABNORMAL LOW (ref 0.60–1.60)

## 2017-04-02 NOTE — Telephone Encounter (Signed)
Pt informed, asked me to send this to her my chart as well.

## 2017-04-02 NOTE — Telephone Encounter (Signed)
Pt takes estradiol 0.5 mg and progesterone 200 mg day 1-12 of each month, pt said for the last 2 months when taking progesterone she typically starts bleeding on day 5 of pills. Pt asked if this is normal? Pt states she doesn't want to bleed every month. Please advise

## 2017-04-02 NOTE — Telephone Encounter (Signed)
She can see if she can take half a tablet of the estrogen daily and the Prometrium for the 12 days of the month and monitor her symptoms for a few months if not she can alternate days half tablet one day with a full tablet of the estrogen the next day but continue with the Prometrium for 12 days each month without interruption

## 2017-04-03 ENCOUNTER — Other Ambulatory Visit: Payer: Self-pay

## 2017-04-03 DIAGNOSIS — E890 Postprocedural hypothyroidism: Secondary | ICD-10-CM

## 2017-04-03 MED ORDER — ARMOUR THYROID 60 MG PO TABS: ORAL_TABLET | ORAL | 1 refills | Status: DC

## 2017-04-03 MED ORDER — THYROID 90 MG PO TABS: ORAL_TABLET | ORAL | 1 refills | Status: DC

## 2017-04-03 NOTE — Telephone Encounter (Signed)
Called patient and gave lab results. Patient had no questions or concerns.  

## 2017-04-10 ENCOUNTER — Encounter: Payer: Self-pay | Admitting: Gynecology

## 2017-05-14 ENCOUNTER — Encounter: Payer: Self-pay | Admitting: Gynecology

## 2017-05-14 DIAGNOSIS — Z1231 Encounter for screening mammogram for malignant neoplasm of breast: Secondary | ICD-10-CM | POA: Diagnosis not present

## 2017-06-05 ENCOUNTER — Other Ambulatory Visit (INDEPENDENT_AMBULATORY_CARE_PROVIDER_SITE_OTHER): Payer: BLUE CROSS/BLUE SHIELD

## 2017-06-05 DIAGNOSIS — E890 Postprocedural hypothyroidism: Secondary | ICD-10-CM

## 2017-06-05 LAB — T4, FREE: FREE T4: 0.65 ng/dL (ref 0.60–1.60)

## 2017-06-05 LAB — T3, FREE: T3 FREE: 4.8 pg/mL — AB (ref 2.3–4.2)

## 2017-06-05 LAB — TSH: TSH: 0.33 u[IU]/mL — ABNORMAL LOW (ref 0.35–4.50)

## 2017-06-06 MED ORDER — ARMOUR THYROID 60 MG PO TABS: ORAL_TABLET | ORAL | 1 refills | Status: DC

## 2017-06-06 MED ORDER — THYROID 90 MG PO TABS: ORAL_TABLET | ORAL | 1 refills | Status: DC

## 2017-06-17 ENCOUNTER — Ambulatory Visit (INDEPENDENT_AMBULATORY_CARE_PROVIDER_SITE_OTHER): Payer: BLUE CROSS/BLUE SHIELD | Admitting: Gynecology

## 2017-06-17 ENCOUNTER — Ambulatory Visit (INDEPENDENT_AMBULATORY_CARE_PROVIDER_SITE_OTHER): Payer: BLUE CROSS/BLUE SHIELD

## 2017-06-17 ENCOUNTER — Other Ambulatory Visit: Payer: Self-pay | Admitting: Gynecology

## 2017-06-17 ENCOUNTER — Other Ambulatory Visit: Payer: BLUE CROSS/BLUE SHIELD

## 2017-06-17 ENCOUNTER — Encounter: Payer: BLUE CROSS/BLUE SHIELD | Admitting: Gynecology

## 2017-06-17 ENCOUNTER — Encounter: Payer: Self-pay | Admitting: Gynecology

## 2017-06-17 VITALS — BP 110/78 | Ht 64.0 in | Wt 140.0 lb

## 2017-06-17 DIAGNOSIS — Z01419 Encounter for gynecological examination (general) (routine) without abnormal findings: Secondary | ICD-10-CM

## 2017-06-17 DIAGNOSIS — D251 Intramural leiomyoma of uterus: Secondary | ICD-10-CM

## 2017-06-17 DIAGNOSIS — N950 Postmenopausal bleeding: Secondary | ICD-10-CM

## 2017-06-17 DIAGNOSIS — R87610 Atypical squamous cells of undetermined significance on cytologic smear of cervix (ASC-US): Secondary | ICD-10-CM | POA: Diagnosis not present

## 2017-06-17 DIAGNOSIS — M8580 Other specified disorders of bone density and structure, unspecified site: Secondary | ICD-10-CM | POA: Diagnosis not present

## 2017-06-17 LAB — CBC WITH DIFFERENTIAL/PLATELET
BASOS ABS: 54 {cells}/uL (ref 0–200)
BASOS PCT: 1 %
Eosinophils Absolute: 162 cells/uL (ref 15–500)
Eosinophils Relative: 3 %
HCT: 36.7 % (ref 35.0–45.0)
HEMOGLOBIN: 11.8 g/dL (ref 11.7–15.5)
LYMPHS ABS: 1728 {cells}/uL (ref 850–3900)
Lymphocytes Relative: 32 %
MCH: 28.6 pg (ref 27.0–33.0)
MCHC: 32.2 g/dL (ref 32.0–36.0)
MCV: 88.9 fL (ref 80.0–100.0)
MONOS PCT: 8 %
MPV: 10.4 fL (ref 7.5–12.5)
Monocytes Absolute: 432 cells/uL (ref 200–950)
NEUTROS ABS: 3024 {cells}/uL (ref 1500–7800)
Neutrophils Relative %: 56 %
PLATELETS: 299 10*3/uL (ref 140–400)
RBC: 4.13 MIL/uL (ref 3.80–5.10)
RDW: 13.8 % (ref 11.0–15.0)
WBC: 5.4 10*3/uL (ref 3.8–10.8)

## 2017-06-17 LAB — COMPREHENSIVE METABOLIC PANEL
ALBUMIN: 3.9 g/dL (ref 3.6–5.1)
ALK PHOS: 57 U/L (ref 33–130)
ALT: 24 U/L (ref 6–29)
AST: 24 U/L (ref 10–35)
BUN: 16 mg/dL (ref 7–25)
CO2: 29 mmol/L (ref 20–31)
CREATININE: 0.72 mg/dL (ref 0.50–1.05)
Calcium: 9 mg/dL (ref 8.6–10.4)
Chloride: 104 mmol/L (ref 98–110)
Glucose, Bld: 83 mg/dL (ref 65–99)
POTASSIUM: 4.4 mmol/L (ref 3.5–5.3)
SODIUM: 140 mmol/L (ref 135–146)
TOTAL PROTEIN: 6.4 g/dL (ref 6.1–8.1)
Total Bilirubin: 0.4 mg/dL (ref 0.2–1.2)

## 2017-06-17 LAB — LIPID PANEL
CHOLESTEROL: 197 mg/dL (ref <200)
HDL: 66 mg/dL (ref >50)
LDL Cholesterol: 115 mg/dL — ABNORMAL HIGH (ref <100)
TRIGLYCERIDES: 81 mg/dL (ref <150)
Total CHOL/HDL Ratio: 3 Ratio (ref <5.0)
VLDL: 16 mg/dL (ref <30)

## 2017-06-17 MED ORDER — ESTRADIOL 0.5 MG PO TABS: 0.5000 mg | ORAL_TABLET | Freq: Every day | ORAL | 4 refills | Status: DC

## 2017-06-17 MED ORDER — PROGESTERONE MICRONIZED 200 MG PO CAPS: 200.0000 mg | ORAL_CAPSULE | Freq: Every day | ORAL | 4 refills | Status: DC

## 2017-06-17 NOTE — Progress Notes (Signed)
[name] 03/14/1956 [id number]   History:    61 y.o.  for annual gyn exam  Who was seen in April of this year for postmenopausal bleeding see previous note for detail. After adjustment of her hormonal replacement therapy  Which is Estrace 0.5 mg daily with the addition of Prometrium 200 mg for 12 days of the month has had no further bleeding at that time patient had an ultrasound and endometrial biopsy. Endometrial biopsy demonstrated the following:  Diagnosis Endometrium, biopsy - ENDOMETRIAL POLYP. - BENIGN PROLIFERATIVE PHASE ENDOMETRIAL TISSUE. - NO HYPERPLASIA OR MALIGNANCY IDENTIFIED.  She also had an ultrasound which demonstrated the following: Uterus measured 8.2 x 5.6 x 6.0 cm with endometrial stripe of 3.9 mm. 2 small intramural myomas measuring 28 x 19 mm, and 17 x 20 mm. Fluid-filled endometrium earlier this morning she had endometrial biopsy. Right adnexa negative. Left ovary normal. With echo and microcalcification. Fluid-filled left adnexa cul-de-sac 43 x 13 x 19 mm. No apparent adnexal masses on the right or left adnexa.  Ultrasound today demonstrated the following: Uterus measures 7.4 x 5.1 x 5.3 cm with an meter stripe of 3.2 mm. Patient had 3 intramural fibroids as follows: 32 x 27 mm, 23 x 22 mm, 19 x 16 mm, 14 x 16 mm. Endometrium was normal. Patient with prior history of RSO adnexa negative. Left ovary normal. No fluid in the cul-de-sac   patient has been followed by the endocrinologist Dr. Bubba Camp for her nontoxic multinodular goiter.patient informed me that in August of 2013 she had iodine 131 treatment for her thyroid.Patient prior to that has had a subtotal thyroidectomy in the past.Review of her record indicated also that she has had laparoscopic right salpingo-oophorectomy in the past and has had a history of stage IV endometriosis. She also suffers from HSV but infrequently. Patient with no prior history of abnormal Pap smears. Patient had a  A bone density  study in 2017 demonstrated the lowest T score was -1.4 at the AP spine. Patient had a normal colonoscopy in 2012.  Past medical history,surgical history, family history and social history were all reviewed and documented in the EPIC chart.  Gynecologic History Patient's last menstrual period was 03/22/2015. Contraception: post menopausal status Last Pap:  2015. Results were: normal Last mammogram:  2018. Results were:  Normal but dense had three-dimensional mammogram  Obstetric History OB History  Gravida Para Term Preterm AB Living  2 2 2     2   SAB TAB Ectopic Multiple Live Births          2    # Outcome Date GA Lbr Len/2nd Weight Sex Delivery Anes PTL Lv  2 Term     M Vag-Spont  N LIV  1 Term     F Vag-Spont  N LIV       ROS: A ROS was performed and pertinent positives and negatives are included in the history.  GENERAL: No fevers or chills. HEENT: No change in vision, no earache, sore throat or sinus congestion. NECK: No pain or stiffness. CARDIOVASCULAR: No chest pain or pressure. No palpitations. PULMONARY: No shortness of breath, cough or wheeze. GASTROINTESTINAL: No abdominal pain, nausea, vomiting or diarrhea, melena or bright red blood per rectum. GENITOURINARY: No urinary frequency, urgency, hesitancy or dysuria. MUSCULOSKELETAL: No joint or muscle pain, no back pain, no recent trauma. DERMATOLOGIC: No rash, no itching, no lesions. ENDOCRINE: No polyuria, polydipsia, no heat or cold intolerance. No recent change in weight. HEMATOLOGICAL:  No anemia or easy bruising or bleeding. NEUROLOGIC: No headache, seizures, numbness, tingling or weakness. PSYCHIATRIC: No depression, no loss of interest in normal activity or change in sleep pattern.     Exam: chaperone present  BP 110/78   Ht 5\' 4"  (1.626 m)   Wt 140 lb (63.5 kg)   LMP 03/22/2015   BMI 24.03 kg/m   Body mass index is 24.03 kg/m.  General appearance : Well developed well nourished female. No acute  distress HEENT: Eyes: no retinal hemorrhage or exudates,  Neck supple, trachea midline, no carotid bruits, no thyroidmegaly Lungs: Clear to auscultation, no rhonchi or wheezes, or rib retractions  Heart: Regular rate and rhythm, no murmurs or gallops Breast:Examined in sitting and supine position were symmetrical in appearance, no palpable masses or tenderness,  no skin retraction, no nipple inversion, no nipple discharge, no skin discoloration, no axillary or supraclavicular lymphadenopathy Abdomen: no palpable masses or tenderness, no rebound or guarding Extremities: no edema or skin discoloration or tenderness  Pelvic:  Bartholin, Urethra, Skene Glands: Within normal limits             Vagina: No gross lesions or discharge  Cervix: No gross lesions or discharge  Uterus   anteverted, normal size, shape and consistency, non-tender and mobile  Adnexa  Without masses or tenderness  Anus and perineum  normal   Rectovaginal  normal sphincter tone without palpated masses or tenderness             Hemoccult  Cards provided     Assessment/Plan:  61 y.o. female for annual exam  With post ablative hypothyroidism being monitored by endocrinologist After patient's adjustment and instructed to take the Prometrium correctly she's had no further bleeding. She is taking half of the tablet of the Estrace 0.5 mg daily with the addition of Prometrium 200 mg for 12 days the month and reports no further bleeding and no vasomotor symptoms. The following fasting screening blood work will be ordered: Fasting lipid profile, comprehensive metabolic panel, and CBC. Pap smear was done today. Patient will need a bone density study next year. She was provided with fecal Hemoccult cards to submit to the office for testing. We discussed importance of monthly breast exams as well as calcium and vitamin D and weightbearing exercises for osteoporosis prevention.   Terrance Mass MD, 10:29 AM 06/17/2017

## 2017-06-20 LAB — PAP IG W/ RFLX HPV ASCU

## 2017-06-21 LAB — HUMAN PAPILLOMAVIRUS, HIGH RISK: HPV DNA High Risk: NOT DETECTED

## 2017-06-22 ENCOUNTER — Other Ambulatory Visit: Payer: Self-pay | Admitting: Internal Medicine

## 2017-07-04 ENCOUNTER — Encounter: Payer: Self-pay | Admitting: Rheumatology

## 2017-07-04 ENCOUNTER — Ambulatory Visit (INDEPENDENT_AMBULATORY_CARE_PROVIDER_SITE_OTHER): Payer: BLUE CROSS/BLUE SHIELD | Admitting: Rheumatology

## 2017-07-04 VITALS — BP 124/72 | HR 74 | Resp 16 | Ht 64.0 in | Wt 138.0 lb

## 2017-07-04 DIAGNOSIS — M19042 Primary osteoarthritis, left hand: Secondary | ICD-10-CM | POA: Diagnosis not present

## 2017-07-04 DIAGNOSIS — M0230 Reiter's disease, unspecified site: Secondary | ICD-10-CM

## 2017-07-04 DIAGNOSIS — M19041 Primary osteoarthritis, right hand: Secondary | ICD-10-CM | POA: Diagnosis not present

## 2017-07-04 DIAGNOSIS — M170 Bilateral primary osteoarthritis of knee: Secondary | ICD-10-CM

## 2017-07-04 DIAGNOSIS — M19072 Primary osteoarthritis, left ankle and foot: Secondary | ICD-10-CM

## 2017-07-04 DIAGNOSIS — M79641 Pain in right hand: Secondary | ICD-10-CM

## 2017-07-04 DIAGNOSIS — M25572 Pain in left ankle and joints of left foot: Secondary | ICD-10-CM

## 2017-07-04 DIAGNOSIS — M19071 Primary osteoarthritis, right ankle and foot: Secondary | ICD-10-CM

## 2017-07-04 NOTE — Progress Notes (Signed)
Office Visit Note  Patient: [name]             Date of Birth: 1956-11-06           MRN: [id number]             PCP: Terrance Mass, MD Referring: Terrance Mass, MD Visit Date: 07/04/2017 Occupation: '@GUAROCC'$ @    Subjective:  Medication Management   History of Present Illness: [name] is a 61 y.o. female   Was last seen 11/05/2016 for osteoarthritis and (reactive arthritis with autoimmune workup negative and no synovitis on exam).  Patient reports that she is doing very well with her joints. No joint pain, swelling, stiffness. She saw Dr. Burney Gauze and had left [facility] surgery. She did well after the surgery. Her right CMC joint is now hurting. She uses right CMC joint on a regular basis and I advised her against using it too often due to risk of muscle atrophy. Patient understands and is agreeable. I advised her that she may want to consider using Voltaren gel. She will discuss with Dr. Burney Gauze her [employer] pain so proper treatment can be given.  Patient is complaining of some left ankle pain. She plays tennis on a regular basis and fears that she may have strained her left ankle or play tennis.  Patient is unable to take omega-3 due to its effect on her AST and ALT (becomes elevated).    Activities of Daily Living:  Patient reports morning stiffness for 15 minutes.   Patient Denies nocturnal pain.  Difficulty dressing/grooming: Denies Difficulty climbing stairs: Denies Difficulty getting out of chair: Denies Difficulty using hands for taps, buttons, cutlery, and/or writing: Denies   Review of Systems  Constitutional: Negative for fatigue.  HENT: Negative for mouth sores and mouth dryness.   Eyes: Negative for dryness.  Respiratory: Negative for shortness of breath.   Gastrointestinal: Negative for constipation and diarrhea.  Musculoskeletal: Negative for myalgias and myalgias.  Skin: Negative for sensitivity to sunlight.  Psychiatric/Behavioral:  Negative for decreased concentration and sleep disturbance.    PMFS History:  Patient Active Problem List   Diagnosis Date Noted  . Postablative hypothyroidism 01/30/2017  . Reactive arthritis ([place]) 11/01/2016  . Primary osteoarthritis of both knees 11/01/2016  . Primary osteoarthritis of both hands 11/01/2016  . Primary osteoarthritis of both feet 11/01/2016  . Leukocytosis   . HTN (hypertension) 05/24/2015  . Hyperlipidemia 05/24/2015  . C. difficile colitis 05/24/2015  . Hypokalemia 05/24/2015  . Hyperglycemia 05/24/2015  . Pain of right thigh 10/20/2014  . Other iron deficiency anemias 08/05/2014  . Palpitations 08/21/2013  . Personal history of endometriosis 02/04/2013  . Fibroid uterus 02/04/2013  . Varicose veins of lower extremities with other complications 47/42/5956  . Varicosities of leg 03/10/2012  . Multiple thyroid nodules 01/22/2012    Past Medical History:  Diagnosis Date  . Arrhythmia 2013   PVCs,-monitor  . Arthritis   . Colon polyps 2008   BENIGN  . Depression    PMDD  . Hyperlipidemia   . Kidney infection   . Palpitations   . Postablative hypothyroidism   . Raynaud's phenomenon   . Rotator cuff disorder    TEAR-    Family History  Problem Relation Age of Onset  . Osteoporosis Father   . Hypertension Father   . Lung cancer Father   . Brain cancer Father   . Hypertension Mother   . Heart attack Mother 35  . Migraines  Child   . Heart attack Maternal Grandfather    Past Surgical History:  Procedure Laterality Date  . DOPPLER ECHOCARDIOGRAPHY  08/14/2008   EF => 55% , no valvular pathology  . EVENT MONITOR  08/15/2012-08/28/2012   NSR with PVC's ,PSVT vs Aflutter WITH 2:1  . HAND SURGERY Left   . NM MYOCAR PERF WALL MOTION  09/04/2012   EF 66% ,LV normal, exercise capcity 11 mets -completeing stae 3 and 1 minute into stage 4,developed some mild chest pain and throat  tightness caused to stop exercise. [facility] TM scor  2 - MOD RISK  .  OOPHORECTOMY  2010   LAP RSO  . ROTATOR CUFF REPAIR    . THYROID LOBECTOMY     RIGHT   Social History   Social History Narrative  . No narrative on file     Objective: Vital Signs: BP 124/72   Pulse 74   Resp 16   Ht '5\' 4"'$  (1.626 m)   Wt 138 lb (62.6 kg)   LMP 03/22/2015   BMI 23.69 kg/m    Physical Exam  Constitutional: She is oriented to person, place, and time. She appears well-developed and well-nourished.  HENT:  Head: Normocephalic and atraumatic.  Eyes: Pupils are equal, round, and reactive to light. EOM are normal.  Cardiovascular: Normal rate, regular rhythm and normal heart sounds.  Exam reveals no gallop and no friction rub.   No murmur heard. Pulmonary/Chest: Effort normal and breath sounds normal. She has no wheezes. She has no rales.  Abdominal: Soft. Bowel sounds are normal. She exhibits no distension. There is no tenderness. There is no guarding. No hernia.  Musculoskeletal: Normal range of motion. She exhibits no edema, tenderness or deformity.  Lymphadenopathy:    She has no cervical adenopathy.  Neurological: She is alert and oriented to person, place, and time. Coordination normal.  Skin: Skin is warm and dry. Capillary refill takes less than 2 seconds. No rash noted.  Psychiatric: She has a normal mood and affect. Her behavior is normal.  Nursing note and vitals reviewed.    Musculoskeletal Exam:  Full range of motion of all joints Grip strength is equal and strong bilaterally Fibromyalgia tender points are absent  CDAI Exam: CDAI Homunculus Exam:   Tenderness:  Right hand: 1st MCP Left hand: 1st MCP  Joint Counts:  CDAI Tender Joint count: 2 CDAI Swollen Joint count: 0  Global Assessments:  Patient Global Assessment: 3 Provider Global Assessment: 3  CDAI Calculated Score: 8   Investigation: No additional findings. Office Visit on 06/17/2017  Component Date Value Ref Range Status  . WBC 06/17/2017 5.4  3.8 - 10.8 K/uL Final  .  RBC 06/17/2017 4.13  3.80 - 5.10 MIL/uL Final  . Hemoglobin 06/17/2017 11.8  11.7 - 15.5 g/dL Final  . HCT 06/17/2017 36.7  35.0 - 45.0 % Final  . MCV 06/17/2017 88.9  80.0 - 100.0 fL Final  . MCH 06/17/2017 28.6  27.0 - 33.0 pg Final  . MCHC 06/17/2017 32.2  32.0 - 36.0 g/dL Final  . RDW 06/17/2017 13.8  11.0 - 15.0 % Final  . Platelets 06/17/2017 299  140 - 400 K/uL Final  . MPV 06/17/2017 10.4  7.5 - 12.5 fL Final  . Neutro Abs 06/17/2017 3024  1,500 - 7,800 cells/uL Final  . Lymphs Abs 06/17/2017 1728  850 - 3,900 cells/uL Final  . Monocytes Absolute 06/17/2017 432  200 - 950 cells/uL Final  . Eosinophils Absolute 06/17/2017  162  15 - 500 cells/uL Final  . Basophils Absolute 06/17/2017 54  0 - 200 cells/uL Final  . Neutrophils Relative % 06/17/2017 56  % Final  . Lymphocytes Relative 06/17/2017 32  % Final  . Monocytes Relative 06/17/2017 8  % Final  . Eosinophils Relative 06/17/2017 3  % Final  . Basophils Relative 06/17/2017 1  % Final  . Smear Review 06/17/2017 Criteria for review not met   Final  . Sodium 06/17/2017 140  135 - 146 mmol/L Final  . Potassium 06/17/2017 4.4  3.5 - 5.3 mmol/L Final  . Chloride 06/17/2017 104  98 - 110 mmol/L Final  . CO2 06/17/2017 29  20 - 31 mmol/L Final  . Glucose, Bld 06/17/2017 83  65 - 99 mg/dL Final  . BUN 06/17/2017 16  7 - 25 mg/dL Final  . Creat 06/17/2017 0.72  0.50 - 1.05 mg/dL Final   Comment:   For patients > or = 59 years of age: The upper reference limit for Creatinine is approximately 13% higher for people identified as African-American.     . Total Bilirubin 06/17/2017 0.4  0.2 - 1.2 mg/dL Final  . Alkaline Phosphatase 06/17/2017 57  33 - 130 U/L Final  . AST 06/17/2017 24  10 - 35 U/L Final  . ALT 06/17/2017 24  6 - 29 U/L Final  . Total Protein 06/17/2017 6.4  6.1 - 8.1 g/dL Final  . Albumin 06/17/2017 3.9  3.6 - 5.1 g/dL Final  . Calcium 06/17/2017 9.0  8.6 - 10.4 mg/dL Final  . Cholesterol 06/17/2017 197  <200 mg/dL  Final  . Triglycerides 06/17/2017 81  <150 mg/dL Final  . HDL 06/17/2017 66  >50 mg/dL Final  . Total CHOL/HDL Ratio 06/17/2017 3.0  <5.0 Ratio Final  . VLDL 06/17/2017 16  <30 mg/dL Final  . LDL Cholesterol 06/17/2017 115* <100 mg/dL Final  . Specimen adequacy: 06/17/2017 SEE NOTE   Final   SATISFACTORY.  Endocervical/transformation zone component present.  Marland Kitchen FINAL DIAGNOSIS: 06/17/2017 SEE NOTE*  Final   Comment: - EPITHELIAL CELL ABNORMALITY:  ATYPICAL SQUAMOUS CELLS OF UNDETERMINED SIGNIFICANCE (ASC-US).   Marland Kitchen COMMENTS: 06/17/2017 SEE NOTE   Final   This Pap test has been evaluated with computer assisted technology.  . RECOMMENDATIONS: 06/17/2017 SEE NOTE   Final   Comment: FOLLOW-UP is suggested as clinically indicated. For a more comprehensive discussion of these recommendations, please refer to the [email or web address] website.   . Cytotechnologist: 06/17/2017 SEE NOTE   Final   JRW, BS CT(ASCP)  . Pathologist: 06/17/2017 SEE NOTE   Final   Comment: Reviewed by S. Jhonnie Garner, MD, (Electronic Signature on File) *  The Pap is a screening test for cervical cancer. It is not a  diagnostic test and is subject to false negative and false positive  results. It is most reliable when a satisfactory sample, regularly  obtained, is submitted with relevant clinical findings and history,  and when the Pap result is evaluated along with historic and current  clinical information.   Marland Kitchen HPV DNA High Risk 06/17/2017 NOT DETECTED   Final   Comment: HIGH RISK HPV types (16,18,31,33,35,39,45,51,52,56,58,59,66,68) were not detected. Other HPV types which cause anogenital lesions may be present. The significance of the other types of HPV in malignant  processes has not been established.                  ** Normal Reference Range: Not Detected **  HPV High Risk testing performed using the APTIMA HPV mRNA Assay.     Lab on 06/05/2017  Component Date Value Ref Range Status  . TSH 06/05/2017  0.33* 0.35 - 4.50 uIU/mL Final  . T3, Free 06/05/2017 4.8* 2.3 - 4.2 pg/mL Final  . Free T4 06/05/2017 0.65  0.60 - 1.60 ng/dL Final   Comment: Specimens from patients who are undergoing biotin therapy and /or ingesting biotin supplements may contain high levels of biotin.  The higher biotin concentration in these specimens interferes with this Free T4 assay.  Specimens that contain high levels  of biotin may cause false high results for this Free T4 assay.  Please interpret results in light of the total clinical presentation of the patient.    Lab on 04/02/2017  Component Date Value Ref Range Status  . T3, Free 04/02/2017 4.1  2.3 - 4.2 pg/mL Final  . Free T4 04/02/2017 0.57* 0.60 - 1.60 ng/dL Final   Comment: Specimens from patients who are undergoing biotin therapy and /or ingesting biotin supplements may contain high levels of biotin.  The higher biotin concentration in these specimens interferes with this Free T4 assay.  Specimens that contain high levels  of biotin may cause false high results for this Free T4 assay.  Please interpret results in light of the total clinical presentation of the patient.    Marland Kitchen TSH 04/02/2017 0.30* 0.35 - 4.50 uIU/mL Final  Office Visit on 01/30/2017  Component Date Value Ref Range Status  . Free T4 01/30/2017 0.79  0.60 - 1.60 ng/dL Final   Comment: Specimens from patients who are undergoing biotin therapy and /or ingesting biotin supplements may contain high levels of biotin.  The higher biotin concentration in these specimens interferes with this Free T4 assay.  Specimens that contain high levels  of biotin may cause false high results for this Free T4 assay.  Please interpret results in light of the total clinical presentation of the patient.    . T3, Free 01/30/2017 4.7* 2.3 - 4.2 pg/mL Final  . TSH 01/30/2017 0.06* 0.35 - 4.50 uIU/mL Final     Imaging: US Transvaginal Non-ob  Result Date: 06/17/2017 Ultrasound today demonstrated the following:  Uterus measures 7.4 x 5.1 x 5.3 cm with an meter stripe of 3.2 mm. Patient had 3 intramural fibroids as follows: 32 x 27 mm, 23 x 22 mm, 19 x 16 mm, 14 x 16 mm. Endometrium was normal. Patient with prior history of RSO adnexa negative. Left ovary normal. No fluid in the cul-de-sac   Speciality Comments: No specialty comments available.    Procedures:  No procedures performed Allergies: Lomotil [diphenoxylate]   Assessment / Plan:     Visit Diagnoses: No diagnosis found.     Plan: Reactive arthritis, onset after C diff infection  Visit Diagnoses: Reactive arthritis ([facility]) - all autoimmune w-u negative july 2016;  Primary osteoarthritis of both hands - left cmc surgery via Dr Mina Marble Jan 2017 worked well;right cmc pain on occassion --> uses brace on a regular basis. I advised her that this can cause muscle atrophy if she uses it too often  Primary osteoarthritis of both feet==> ongoing pain off and on.  Primary osteoarthritis of both knees - aware of visco and will ask if needed when kj pain is ongoing and problematic.  Pain in left ankle and joints of left foot - occasional pain; uses brace (old and will get Rx from pcp for new brace).  Return to clinic in  9 months.  Orders: No orders of the defined types were placed in this encounter.  No orders of the defined types were placed in this encounter.   Face-to-face time spent with patient was 30 minutes. 50% of time was spent in counseling and coordination of care.  Follow-Up Instructions: No Follow-up on file.   Eliezer Lofts, PA-C  Note - This record has been created using [employer].  Chart creation errors have been sought, but may not always  have been located. Such creation errors do not reflect on  the standard of medical care.

## 2017-07-31 ENCOUNTER — Other Ambulatory Visit: Payer: Self-pay | Admitting: Internal Medicine

## 2017-08-02 ENCOUNTER — Ambulatory Visit: Payer: BLUE CROSS/BLUE SHIELD | Admitting: Internal Medicine

## 2017-08-02 DIAGNOSIS — Z0289 Encounter for other administrative examinations: Secondary | ICD-10-CM

## 2017-08-20 DIAGNOSIS — Z86010 Personal history of colonic polyps: Secondary | ICD-10-CM | POA: Diagnosis not present

## 2017-08-20 DIAGNOSIS — K580 Irritable bowel syndrome with diarrhea: Secondary | ICD-10-CM | POA: Diagnosis not present

## 2017-09-07 ENCOUNTER — Other Ambulatory Visit: Payer: Self-pay | Admitting: Internal Medicine

## 2017-09-19 ENCOUNTER — Other Ambulatory Visit: Payer: Self-pay | Admitting: Internal Medicine

## 2017-09-30 ENCOUNTER — Ambulatory Visit (INDEPENDENT_AMBULATORY_CARE_PROVIDER_SITE_OTHER): Payer: BLUE CROSS/BLUE SHIELD | Admitting: Internal Medicine

## 2017-09-30 VITALS — BP 114/76 | HR 69 | Temp 98.1°F | Wt 143.1 lb

## 2017-09-30 DIAGNOSIS — E042 Nontoxic multinodular goiter: Secondary | ICD-10-CM

## 2017-09-30 DIAGNOSIS — E890 Postprocedural hypothyroidism: Secondary | ICD-10-CM | POA: Diagnosis not present

## 2017-09-30 LAB — T4, FREE: Free T4: 0.48 ng/dL — ABNORMAL LOW (ref 0.60–1.60)

## 2017-09-30 LAB — TSH: TSH: 1.19 u[IU]/mL (ref 0.35–4.50)

## 2017-09-30 LAB — T3, FREE: T3 FREE: 3.5 pg/mL (ref 2.3–4.2)

## 2017-09-30 NOTE — Progress Notes (Signed)
Patient ID: [name], female   DOB: [birth year], 61 y.o.   MRN: [id number]    HPI  [name] is a 61 y.o.-year-old female, initially referred by Dr. Uvaldo Taylor, now returning for follow-up for MNG and postablative hypothyroidism. She saw Dr. Chalmers Taylor and Dr. Tye Taylor previously.  Last visit with me 8 months ago.  On Armour  Reviewed history: Pt. has a long h/o thyroid nodules, s/p R thyroidectomy in 2009. She then developed hyperthyroidism >> had RAI tx in 06/2012. She continues to have L thyroid nodules >> largest Bx'ed in 2005, 2009, 2010, with benign results.  Latest thyroid ultrasound (05/28/2012): Right thyroid lobe:  Surgically absent. Left thyroid lobe:  Measures 4.7 x 2.1 x 2.1 cm (stable) Isthmus:  Surgically absent  Focal nodules:   - Sonographically apparent left thyroid lobe nodules include a 3.7 x 1.7 x 2.0 cm heterogeneous nodule with hypervascularity and faint calcifications (formerly 3.6 x 1.7 x 2.0 cm) - a 1.4 x 0.5 x 1.0 cm heterogeneous mid pole nodule (stable) - a 0.7 x 0.5 x 1.0 cm upper pole solid nodule (stable).  Lymphadenopathy:  None visualized.  IMPRESSION: 1.  Stable appearance of thyroid nodules.  The dominant left lower pole nodule has been biopsied more than one time, and characterized as hyperplastic/nonmalignant.  Although prior biopsies have been negative, the lesion does have some internal characteristics such as microcalcifications which probably warrant continued observation.  She has been dx with hypothyroidism in 2013 >> on Naturethroid 97.5 mg >> Armour 90 mg >> now Armour 90 1/7 days and 60 mg 6/7 days.  She takes the thyroid hormone: - in am - fasting - Boost 1h later - at least 1h from b'fast - no Ca, Fe, MVI, PPIs - not on Biotin  I reviewed pt's thyroid tests: Lab Results  Component Value Date   TSH 0.33 (L) 06/05/2017   TSH 0.30 (L) 04/02/2017   TSH 0.06 (L) 01/30/2017   TSH 0.94 06/05/2016   TSH 1.497 05/24/2015    TSH 0.213 (L) 02/19/2014   FREET4 0.65 06/05/2017   FREET4 0.57 (L) 04/02/2017   FREET4 0.79 01/30/2017    Pt denies: - feeling nodules in neck - hoarseness - dysphagia - choking - SOB with lying down  She has + FH of thyroid disorders in: PGM >> goiter. No FH of thyroid cancer. No h/o radiation tx to head or neck.  No seaweed or kelp. No recent contrast studies. No herbal supplements. No recent steroids use.   Pt. also has a history of C diff colitis in 2015. On Viberzi..  ROS:  Constitutional: +weight gain/no weight loss, + fatigue, no subjective hyperthermia, no subjective hypothermia Eyes: no blurry vision, no xerophthalmia ENT: no sore throat, + see HPI Cardiovascular: no CP/no SOB/+ palpitations after she stopped Metoprolol - but drinks 4 x unsweet tea per day)/no leg swelling Respiratory: no cough/no SOB/no wheezing Gastrointestinal: no N/no V/no D/no C/no acid reflux Musculoskeletal: no muscle aches/no joint aches Skin: no rashes, no hair loss Neurological: no tremors/no numbness/no tingling/no dizziness  I reviewed pt's medications, allergies, PMH, social hx, family hx, and changes were documented in the history of present illness. Otherwise, unchanged from my initial visit note. She stopped Metoprolol.  Past Medical History:  Diagnosis Date  . Arrhythmia 2013   PVCs,-monitor  . Arthritis   . Colon polyps 2008   BENIGN  . Depression    PMDD  . Hyperlipidemia   . Kidney infection   .  Palpitations   . Postablative hypothyroidism   . Raynaud's phenomenon   . Rotator cuff disorder    TEAR-   Past Surgical History:  Procedure Laterality Date  . DOPPLER ECHOCARDIOGRAPHY  08/14/2008   EF => 55% , no valvular pathology  . EVENT MONITOR  08/15/2012-08/28/2012   NSR with PVC's ,PSVT vs Aflutter WITH 2:1  . HAND SURGERY Left   . NM MYOCAR PERF WALL MOTION  09/04/2012   EF 66% ,LV normal, exercise capcity 11 mets -completeing stae 3 and 1 minute into stage  4,developed some mild chest pain and throat  tightness caused to stop exercise. [facility] TM scor  2 - MOD RISK  . OOPHORECTOMY  2010   LAP RSO  . ROTATOR CUFF REPAIR    . THYROID LOBECTOMY     RIGHT   Social History   Social History  . Marital status: Married    Spouse name: N/A  . Number of children: 2   Occupational History  . Futures trader   Social History Main Topics  . Smoking status: Never Smoker  . Smokeless tobacco: Never Used  . Alcohol use      Comment: two glasses of wine 3x a week  . Drug use: No   Current Outpatient Medications on File Prior to Visit  Medication Sig Dispense Refill  . ARMOUR THYROID 60 MG tablet TAKE 1 TABLET BY MOUTH FIVE TIMES PER WEEK 30 tablet 0  . ARMOUR THYROID 60 MG tablet TAKE 1 TABLET BY MOUTH FIVE TIMES PER WEEK 30 tablet 0  . cetirizine (ZYRTEC) 10 MG tablet Take 10 mg by mouth daily.    . Cholecalciferol (VITAMIN D-3 PO) Take 1 tablet by mouth daily.    . Cranberry (SM CRANBERRY) 300 MG tablet Take 300 mg by mouth daily.    . Eluxadoline (VIBERZI) 100 MG TABS Take 2 tablets by mouth once a week. AS NEEDED    . estradiol (ESTRACE) 0.5 MG tablet Take 1 tablet (0.5 mg total) by mouth daily. 90 tablet 4  . Ginger, Zingiber officinalis, (GINGER PO) Take 1 tablet by mouth daily.    . Probiotic Product (PROBIOTIC-10 PO) Take by mouth.    . progesterone (PROMETRIUM) 200 MG capsule Take 1 capsule (200 mg total) by mouth daily. Take one tablet the first 12 days of the month 40 capsule 4  . sertraline (ZOLOFT) 50 MG tablet Take 75 mg by mouth daily.   0  . thyroid (ARMOUR) 90 MG tablet Take 1 out of 7 days. 30 tablet 1  . TURMERIC PO Take 1 capsule by mouth daily.    . valACYclovir (VALTREX) 1000 MG tablet Take 1 tablet (1,000 mg total) by mouth daily. 30 tablet 3   No current facility-administered medications on file prior to visit.    Allergies  Allergen Reactions  . Lomotil [Diphenoxylate] Hives    SWELLING, ITCHING    Family History   Problem Relation Age of Onset  . Osteoporosis Father   . Hypertension Father   . Lung cancer Father   . Brain cancer Father   . Hypertension Mother   . Heart attack Mother 57  . Migraines Child   . Heart attack Maternal Grandfather    PE: BP 114/76   Pulse 69   Temp 98.1 F (36.7 C) (Oral)   Wt 143 lb 2 oz (64.9 kg)   LMP 03/22/2015   SpO2 96%   BMI 24.57 kg/m  Wt Readings from Last 3 Encounters:  09/30/17 143 lb 2 oz (64.9 kg)  07/04/17 138 lb (62.6 kg)  06/17/17 140 lb (63.5 kg)   Constitutional: normal weight, in NAD Eyes: PERRLA, EOMI, no exophthalmos ENT: moist mucous membranes, no masses felt in the neck, thyroidectomy scar healed, no cervical lymphadenopathy Cardiovascular: RRR, No MRG Respiratory: CTA B Gastrointestinal: abdomen soft, NT, ND, BS+ Musculoskeletal: no deformities, strength intact in all 4 Skin: moist, warm, no rashes Neurological: no tremor with outstretched hands, DTR normal in all 4  ASSESSMENT: 1. Postablative Hypothyroidism  2. Thyroid nodules  PLAN:  1. Patient with long-standing hypothyroidism, on Armour 60 mg 6/7 days and 90 mg 1/7 days, decreased from 90 mg daily. She was previously on Naturethroid 97.5 mg, but had to change due to [employer]. - latest thyroid labs reviewed with pt >> slightly low >> Armour dose adjusted then - pt feels good on this dose. - we discussed about taking the thyroid hormone every day, with water, >30 minutes before breakfast, separated by >4 hours from acid reflux medications, calcium, iron, multivitamins. Pt. is taking it correctly - will check thyroid tests today: TSH, fT3, and fT4 - If labs are abnormal, she will need to return for repeat TFTs in 1.5 months - OTW, RTC in 1 year ut for labs in 6 mo  2. Thyroid nodules - Patient with a history of right thyroidectomy and with 3 dominant nodules in the left thyroid lobe.  The largest nodule has been biopsied 3 times, last being in 2010, with benign  results.  We discussed that no biopsies are needed anymore for this nodule.  The other 2 nodules are smaller, with the largest being 1.4 cm.  These were stable on serial ultrasounds - No neck compression symptoms - We again discussed to follow her clinically for the nodules and repeat an ultrasound only if she develops neck compression symptoms or I feel the nodule is enlarging at palpation  Needs refills.  Component     Latest Ref Rng & Units 09/30/2017  TSH     0.35 - 4.50 uIU/mL 1.19  T4,Free(Direct)     0.60 - 1.60 ng/dL 0.48 (L)  Triiodothyronine,Free,Serum     2.3 - 4.2 pg/mL 3.5   Labs are normal except slightly low fT4 >> no need to change the Armour dose.  Philemon Kingdom, MD PhD [facility] Endocrinology

## 2017-09-30 NOTE — Patient Instructions (Signed)
Please continue: Armour 60 mg 6/7 days and 90 mg 1/7 days  Take the thyroid hormone every day, with water, at least 30 minutes before breakfast, separated by at least 4 hours from: - acid reflux medications - calcium - iron - multivitamins  Please come back for a follow-up appointment in 1 year but for labs in 6 months.

## 2017-10-01 ENCOUNTER — Encounter: Payer: Self-pay | Admitting: Internal Medicine

## 2017-10-01 MED ORDER — ARMOUR THYROID 60 MG PO TABS: ORAL_TABLET | ORAL | 3 refills | Status: DC

## 2017-10-01 MED ORDER — THYROID 90 MG PO TABS: ORAL_TABLET | ORAL | 1 refills | Status: DC

## 2017-10-28 ENCOUNTER — Other Ambulatory Visit: Payer: Self-pay | Admitting: Internal Medicine

## 2018-03-21 NOTE — Progress Notes (Signed)
Office Visit Note  Patient: [name]             Date of Birth: [birth year]           MRN: [id number]             PCP: Terrance Mass, MD Referring: Terrance Mass, MD Visit Date: 04/03/2018 Occupation: @GUAROCC @    Subjective:  Other (left hip pain, left foot numbness )   History of Present Illness: [name] is a 62 y.o. female with history of psoriasis and osteoarthritis.  According to patient she started having some left-sided hip pain after playing tennis.  She felt it was tendinitis it improved over time.  She states it hurts only at nighttime if she rolls on the left side.  She has been also having some midline lower back pain.  She has been experiencing some numbness in the bottom of the left foot.  None of the other joints are painful.  There is no history of joint swelling.  She states she has a small patch of psoriasis on her nose.  Activities of Daily Living:  Patient reports morning stiffness for 0 minute.   Patient Reports nocturnal pain.  Lower back difficulty dressing/grooming: Denies Difficulty climbing stairs: Denies Difficulty getting out of chair: Denies Difficulty using hands for taps, buttons, cutlery, and/or writing: Denies   Review of Systems  Constitutional: Negative for fatigue, night sweats, weight gain and weight loss.  HENT: Negative for mouth sores, trouble swallowing, trouble swallowing, mouth dryness and nose dryness.   Eyes: Negative for pain, redness, visual disturbance and dryness.  Respiratory: Negative for cough, shortness of breath and difficulty breathing.   Cardiovascular: Negative for chest pain, palpitations, hypertension, irregular heartbeat and swelling in legs/feet.  Gastrointestinal: Negative for blood in stool, constipation and diarrhea.  Endocrine: Negative for increased urination.  Genitourinary: Negative for vaginal dryness.  Musculoskeletal: Positive for arthralgias and joint pain. Negative for joint swelling,  myalgias, muscle weakness, morning stiffness, muscle tenderness and myalgias.  Skin: Positive for rash. Negative for color change, hair loss, skin tightness, ulcers and sensitivity to sunlight.  Allergic/Immunologic: Negative for susceptible to infections.  Neurological: Negative for dizziness, memory loss, night sweats and weakness.  Hematological: Negative for swollen glands.  Psychiatric/Behavioral: Negative for depressed mood and sleep disturbance. The patient is not nervous/anxious.     PMFS History:  Patient Active Problem List   Diagnosis Date Noted  . Psoriasis 04/03/2018  . Postablative hypothyroidism 01/30/2017  . Reactive arthritis ([place]) 11/01/2016  . Primary osteoarthritis of both knees 11/01/2016  . Primary osteoarthritis of both hands 11/01/2016  . Primary osteoarthritis of both feet 11/01/2016  . Leukocytosis   . HTN (hypertension) 05/24/2015  . Hyperlipidemia 05/24/2015  . C. difficile colitis 05/24/2015  . Hypokalemia 05/24/2015  . Hyperglycemia 05/24/2015  . Pain of right thigh 10/20/2014  . Other iron deficiency anemias 08/05/2014  . Palpitations 08/21/2013  . Personal history of endometriosis 02/04/2013  . Fibroid uterus 02/04/2013  . Varicose veins of lower extremities with other complications 75/64/3329  . Varicosities of leg 03/10/2012  . Multiple thyroid nodules 01/22/2012    Past Medical History:  Diagnosis Date  . Arrhythmia 2013   PVCs,-monitor  . Arthritis   . Colon polyps 2008   BENIGN  . Depression    PMDD  . Hyperlipidemia   . Kidney infection   . Palpitations   . Postablative hypothyroidism   . Raynaud's phenomenon   . Rotator  cuff disorder    TEAR-    Family History  Problem Relation Age of Onset  . Hypertension Father   . Lung cancer Father   . Brain cancer Father   . Hypertension Mother   . Heart attack Mother 36  . Osteoporosis Mother   . Obesity Brother   . Obesity Brother   . Heart attack Maternal Grandfather   .  Migraines Daughter    Past Surgical History:  Procedure Laterality Date  . DOPPLER ECHOCARDIOGRAPHY  08/14/2008   EF => 55% , no valvular pathology  . EVENT MONITOR  08/15/2012-08/28/2012   NSR with PVC's ,PSVT vs Aflutter WITH 2:1  . HAND SURGERY Left   . NM MYOCAR PERF WALL MOTION  09/04/2012   EF 66% ,LV normal, exercise [place] 11 mets -completeing stae 3 and 1 minute into stage 4,developed some mild chest pain and throat  tightness caused to stop exercise. [facility] TM scor  2 - MOD RISK  . OOPHORECTOMY  2010   LAP RSO  . ROTATOR CUFF REPAIR    . THYROID LOBECTOMY     RIGHT   Social History   Social History Narrative  . Not on file     Objective: Vital Signs: BP 109/64 (BP Location: Left Arm, Patient Position: Sitting, Cuff Size: Normal)   Pulse 67   Resp 12   Ht 5\' 4"  (1.626 m)   Wt 140 lb (63.5 kg)   LMP 03/22/2015   BMI 24.03 kg/m    Physical Exam  Constitutional: She is oriented to person, place, and time. She appears well-developed and well-nourished.  HENT:  Head: Normocephalic and atraumatic.  Eyes: Conjunctivae and EOM are normal.  Neck: Normal range of motion.  Cardiovascular: Normal rate, regular rhythm, normal heart sounds and intact distal pulses.  Pulmonary/Chest: Effort normal and breath sounds normal.  Abdominal: Soft. Bowel sounds are normal.  Lymphadenopathy:    She has no cervical adenopathy.  Neurological: She is alert and oriented to person, place, and time.  Skin: Skin is warm and dry. Capillary refill takes less than 2 seconds.  Psychiatric: She has a normal mood and affect. Her behavior is normal.  Nursing note and vitals reviewed.    Musculoskeletal Exam: C-spine thoracic lumbar spine good range of motion.  Shoulder joints elbow joints wrist joint MCPs PIPs DIPs were in good range of motion.  Hip joints knee joints ankles MTPs PIPs DIPs with good range of motion.  She has tenderness on palpation of left trochanteric bursa consistent with  trochanteric bursitis.  CDAI Exam: No CDAI exam completed.    Investigation: No additional findings.   Imaging: No results found.  Speciality Comments: No specialty comments available.    Procedures:  Large Joint Inj: L greater trochanter on 04/03/2018 4:19 PM Indications: pain Details: 27 G 1.5 in needle, lateral approach  Arthrogram: No  Medications: 1.5 mL lidocaine (PF) 1 %; 40 mg triamcinolone acetonide 40 MG/ML Aspirate: 0 mL Outcome: tolerated well, no immediate complications Procedure, treatment alternatives, risks and benefits explained, specific risks discussed. Consent was given by the patient. Immediately prior to procedure a time out was called to verify the correct patient, procedure, equipment, support staff and site/side marked as required. Patient was prepped and draped in the usual sterile fashion.     Allergies: Lomotil [diphenoxylate]   Assessment / Plan:     Visit Diagnoses: Acute midline low back pain without sciatica-pain started after playing tennis.  Which is localized.  I have  given her some back exercises handout.  If her pain persist she is supposed to notify me.  Trochanteric bursitis of left hip-she is been having increased pain and discomfort over left trochanteric bursa.  She is also experiencing nocturnal pain.  After informed consent was obtained the left trochanteric area was injected with cortisone as described above.  Psoriasis-he has a small patch of psoriasis on her nose.  She wanted a refill on triamcinolone which was prescribed.  Side effects were discussed.  Primary osteoarthritis of both hands-she is not having much discomfort in her hands currently.  Primary osteoarthritis of both knees-she has some chronic discomfort in her knee joints.  Primary osteoarthritis of both feet-she has osteoarthritis in her feet.  She has been experiencing some numbness in the ball of the left foot.  She states this usually starts at the time of  playing tennis.  If her symptoms persist she will notify me.  Raynaud's disease without gangrene-currently not active.  Other medical problems are listed as follows: History of attention deficit disorder  History of hypothyroidism  History of hyperlipidemia  History of hypertension    Orders: Orders Placed This Encounter  Procedures  . Large Joint Inj   Meds ordered this encounter  Medications  . triamcinolone (KENALOG) 0.025 % cream    Sig: Apply 1 application topically 2 (two) times daily.    Dispense:  30 g    Refill:  0    Face-to-face time spent with patient was 30 minutes. >50% of time was spent in counseling and coordination of care.  Follow-Up Instructions: Return in about 6 months (around 10/04/2018).   Bo Merino, MD  Note - This record has been created using Editor, commissioning.  Chart creation errors have been sought, but may not always  have been located. Such creation errors do not reflect on  the standard of medical care.

## 2018-03-31 ENCOUNTER — Other Ambulatory Visit (INDEPENDENT_AMBULATORY_CARE_PROVIDER_SITE_OTHER): Payer: BLUE CROSS/BLUE SHIELD

## 2018-03-31 ENCOUNTER — Other Ambulatory Visit: Payer: Self-pay

## 2018-03-31 DIAGNOSIS — E042 Nontoxic multinodular goiter: Secondary | ICD-10-CM | POA: Diagnosis not present

## 2018-03-31 DIAGNOSIS — E890 Postprocedural hypothyroidism: Secondary | ICD-10-CM

## 2018-03-31 LAB — T3, FREE: T3 FREE: 3.9 pg/mL (ref 2.3–4.2)

## 2018-03-31 LAB — T4, FREE: FREE T4: 0.61 ng/dL (ref 0.60–1.60)

## 2018-03-31 LAB — TSH: TSH: 1 u[IU]/mL (ref 0.35–4.50)

## 2018-04-03 ENCOUNTER — Ambulatory Visit (INDEPENDENT_AMBULATORY_CARE_PROVIDER_SITE_OTHER): Payer: BLUE CROSS/BLUE SHIELD | Admitting: Rheumatology

## 2018-04-03 ENCOUNTER — Encounter: Payer: Self-pay | Admitting: Rheumatology

## 2018-04-03 VITALS — BP 109/64 | HR 67 | Resp 12 | Ht 64.0 in | Wt 140.0 lb

## 2018-04-03 DIAGNOSIS — M19072 Primary osteoarthritis, left ankle and foot: Secondary | ICD-10-CM

## 2018-04-03 DIAGNOSIS — M19041 Primary osteoarthritis, right hand: Secondary | ICD-10-CM

## 2018-04-03 DIAGNOSIS — Z8639 Personal history of other endocrine, nutritional and metabolic disease: Secondary | ICD-10-CM | POA: Diagnosis not present

## 2018-04-03 DIAGNOSIS — Z8659 Personal history of other mental and behavioral disorders: Secondary | ICD-10-CM | POA: Diagnosis not present

## 2018-04-03 DIAGNOSIS — Z8679 Personal history of other diseases of the circulatory system: Secondary | ICD-10-CM | POA: Diagnosis not present

## 2018-04-03 DIAGNOSIS — I7300 Raynaud's syndrome without gangrene: Secondary | ICD-10-CM | POA: Diagnosis not present

## 2018-04-03 DIAGNOSIS — M7062 Trochanteric bursitis, left hip: Secondary | ICD-10-CM

## 2018-04-03 DIAGNOSIS — M19042 Primary osteoarthritis, left hand: Secondary | ICD-10-CM

## 2018-04-03 DIAGNOSIS — L409 Psoriasis, unspecified: Secondary | ICD-10-CM | POA: Diagnosis not present

## 2018-04-03 DIAGNOSIS — M545 Low back pain, unspecified: Secondary | ICD-10-CM

## 2018-04-03 DIAGNOSIS — M170 Bilateral primary osteoarthritis of knee: Secondary | ICD-10-CM

## 2018-04-03 DIAGNOSIS — M19071 Primary osteoarthritis, right ankle and foot: Secondary | ICD-10-CM | POA: Diagnosis not present

## 2018-04-03 MED ORDER — TRIAMCINOLONE ACETONIDE 40 MG/ML IJ SUSP
40.0000 mg | INTRAMUSCULAR | Status: AC | PRN
  Administered 2018-04-03: 40 mg via INTRA_ARTICULAR

## 2018-04-03 MED ORDER — LIDOCAINE HCL (PF) 1 % IJ SOLN
1.5000 mL | INTRAMUSCULAR | Status: AC | PRN
  Administered 2018-04-03: 1.5 mL

## 2018-04-03 MED ORDER — TRIAMCINOLONE ACETONIDE 0.025 % EX CREA: 1.0000 "application " | TOPICAL_CREAM | Freq: Two times a day (BID) | CUTANEOUS | 0 refills | Status: DC

## 2018-04-03 NOTE — Patient Instructions (Signed)
Iliotibial Band Syndrome Rehab Ask your health care provider which exercises are safe for you. Do exercises exactly as told by your health care provider and adjust them as directed. It is normal to feel mild stretching, pulling, tightness, or discomfort as you do these exercises, but you should stop right away if you feel sudden pain or your pain gets worse.Do not begin these exercises until told by your health care provider. Stretching and range of motion exercises These exercises warm up your muscles and joints and improve the movement and flexibility of your hip and pelvis. Exercise A: Quadriceps, prone  1. Lie on your abdomen on a firm surface, such as a bed or padded floor. 2. Bend your left / right knee and hold your ankle. If you cannot reach your ankle or pant leg, loop a belt around your foot and grab the belt instead. 3. Gently pull your heel toward your buttocks. Your knee should not slide out to the side. You should feel a stretch in the front of your thigh and knee. 4. Hold this position for __________ seconds. Repeat __________ times. Complete this stretch __________ times a day. Exercise B: Iliotibial band  1. Lie on your side with your left / right leg in the top position. 2. Bend both of your knees and grab your left / right ankle. Stretch out your bottom arm to help you balance. 3. Slowly bring your top knee back so your thigh goes behind your trunk. 4. Slowly lower your top leg toward the floor until you feel a gentle stretch on the outside of your left / right hip and thigh. If you do not feel a stretch and your knee will not fall farther, place the heel of your other foot on top of your knee and pull your knee down toward the floor with your foot. 5. Hold this position for __________ seconds. Repeat __________ times. Complete this stretch __________ times a day. Strengthening exercises These exercises build strength and endurance in your hip and pelvis. Endurance is the  ability to use your muscles for a long time, even after they get tired. Exercise C: Straight leg raises ( hip abductors) 1. Lie on your side with your left / right leg in the top position. Lie so your head, shoulder, knee, and hip line up. You may bend your bottom knee to help you balance. 2. Roll your hips slightly forward so your hips are stacked directly over each other and your left / right knee is facing forward. 3. Tense the muscles in your outer thigh and lift your top leg 4-6 inches (10-15 cm). 4. Hold this position for __________ seconds. 5. Slowly return to the starting position. Let your muscles relax completely before doing another repetition. Repeat __________ times. Complete this exercise __________ times a day. Exercise D: Straight leg raises ( hip extensors) 1. Lie on your abdomen on your bed or a firm surface. You can put a pillow under your hips if that is more comfortable. 2. Bend your left / right knee so your foot is straight up in the air. 3. Squeeze your buttock muscles and lift your left / right thigh off the bed. Do not let your back arch. 4. Tense this muscle as hard as you can without increasing any knee pain. 5. Hold this position for __________ seconds. 6. Slowly lower your leg to the starting position and allow it to relax completely. Repeat __________ times. Complete this exercise __________ times a day. Exercise E: Hip  hike 1. Stand sideways on a bottom step. Stand on your left / right leg with your other foot unsupported next to the step. You can hold onto the railing or wall if needed for balance. 2. Keep your knees straight and your torso square. Then, lift your left / right hip up toward the ceiling. 3. Slowly let your left / right hip lower toward the floor, past the starting position. Your foot should get closer to the floor. Do not lean or bend your knees. Repeat __________ times. Complete this exercise __________ times a day. This information is not  intended to replace advice given to you by your health care provider. Make sure you discuss any questions you have with your health care provider. Document Released: 11/12/2005 Document Revised: 07/17/2016 Document Reviewed: 10/14/2015 Elsevier Interactive Patient Education  2018 [employer] Back Exercises The following exercises strengthen the muscles that help to support the back. They also help to keep the lower back flexible. Doing these exercises can help to prevent back pain or lessen existing pain. If you have back pain or discomfort, try doing these exercises 2-3 times each day or as told by your health care provider. When the pain goes away, do them once each day, but increase the number of times that you repeat the steps for each exercise (do more repetitions). If you do not have back pain or discomfort, do these exercises once each day or as told by your health care provider. Exercises Single Knee to Chest  Repeat these steps 3-5 times for each leg: 1. Lie on your back on a firm bed or the floor with your legs extended. 2. Bring one knee to your chest. Your other leg should stay extended and in contact with the floor. 3. Hold your knee in place by grabbing your knee or thigh. 4. Pull on your knee until you feel a gentle stretch in your lower back. 5. Hold the stretch for 10-30 seconds. 6. Slowly release and straighten your leg.  Pelvic Tilt  Repeat these steps 5-10 times: 1. Lie on your back on a firm bed or the floor with your legs extended. 2. Bend your knees so they are pointing toward the ceiling and your feet are flat on the floor. 3. Tighten your lower abdominal muscles to press your lower back against the floor. This motion will tilt your pelvis so your tailbone points up toward the ceiling instead of pointing to your feet or the floor. 4. With gentle tension and even breathing, hold this position for 5-10 seconds.  Cat-Cow  Repeat these steps until your lower back  becomes more flexible: 1. Get into a hands-and-knees position on a firm surface. Keep your hands under your shoulders, and keep your knees under your hips. You may place padding under your knees for comfort. 2. Let your head hang down, and point your tailbone toward the floor so your lower back becomes rounded like the back of a cat. 3. Hold this position for 5 seconds. 4. Slowly lift your head and point your tailbone up toward the ceiling so your back forms a sagging arch like the back of a cow. 5. Hold this position for 5 seconds.  Press-Ups  Repeat these steps 5-10 times: 1. Lie on your abdomen (face-down) on the floor. 2. Place your palms near your head, about shoulder-width apart. 3. While you keep your back as relaxed as possible and keep your hips on the floor, slowly straighten your arms to raise the top   half of your body and lift your shoulders. Do not use your back muscles to raise your upper torso. You may adjust the placement of your hands to make yourself more comfortable. 4. Hold this position for 5 seconds while you keep your back relaxed. 5. Slowly return to lying flat on the floor.  Bridges  Repeat these steps 10 times: 1. Lie on your back on a firm surface. 2. Bend your knees so they are pointing toward the ceiling and your feet are flat on the floor. 3. Tighten your buttocks muscles and lift your buttocks off of the floor until your waist is at almost the same height as your knees. You should feel the muscles working in your buttocks and the back of your thighs. If you do not feel these muscles, slide your feet 1-2 inches farther away from your buttocks. 4. Hold this position for 3-5 seconds. 5. Slowly lower your hips to the starting position, and allow your buttocks muscles to relax completely.  If this exercise is too easy, try doing it with your arms crossed over your chest. Abdominal Crunches  Repeat these steps 5-10 times: 1. Lie on your back on a firm bed or the  floor with your legs extended. 2. Bend your knees so they are pointing toward the ceiling and your feet are flat on the floor. 3. Cross your arms over your chest. 4. Tip your chin slightly toward your chest without bending your neck. 5. Tighten your abdominal muscles and slowly raise your trunk (torso) high enough to lift your shoulder blades a tiny bit off of the floor. Avoid raising your torso higher than that, because it can put too much stress on your low back and it does not help to strengthen your abdominal muscles. 6. Slowly return to your starting position.  Back Lifts Repeat these steps 5-10 times: 1. Lie on your abdomen (face-down) with your arms at your sides, and rest your forehead on the floor. 2. Tighten the muscles in your legs and your buttocks. 3. Slowly lift your chest off of the floor while you keep your hips pressed to the floor. Keep the back of your head in line with the curve in your back. Your eyes should be looking at the floor. 4. Hold this position for 3-5 seconds. 5. Slowly return to your starting position.  Contact a health care provider if:  Your back pain or discomfort gets much worse when you do an exercise.  Your back pain or discomfort does not lessen within 2 hours after you exercise. If you have any of these problems, stop doing these exercises right away. Do not do them again unless your health care provider says that you can. Get help right away if:  You develop sudden, severe back pain. If this happens, stop doing the exercises right away. Do not do them again unless your health care provider says that you can. This information is not intended to replace advice given to you by your health care provider. Make sure you discuss any questions you have with your health care provider. Document Released: 12/20/2004 Document Revised: 03/21/2016 Document Reviewed: 01/06/2015 [employer] Interactive Patient Education  2017 [employer]  

## 2018-04-26 HISTORY — PX: OTHER SURGICAL HISTORY: SHX169

## 2018-05-08 DIAGNOSIS — M25571 Pain in right ankle and joints of right foot: Secondary | ICD-10-CM | POA: Diagnosis not present

## 2018-05-08 DIAGNOSIS — Y92411 Interstate highway as the place of occurrence of the external cause: Secondary | ICD-10-CM | POA: Diagnosis not present

## 2018-05-08 DIAGNOSIS — S3991XA Unspecified injury of abdomen, initial encounter: Secondary | ICD-10-CM | POA: Diagnosis not present

## 2018-05-08 DIAGNOSIS — E890 Postprocedural hypothyroidism: Secondary | ICD-10-CM | POA: Diagnosis not present

## 2018-05-08 DIAGNOSIS — R609 Edema, unspecified: Secondary | ICD-10-CM | POA: Diagnosis not present

## 2018-05-08 DIAGNOSIS — S20219A Contusion of unspecified front wall of thorax, initial encounter: Secondary | ICD-10-CM | POA: Diagnosis not present

## 2018-05-08 DIAGNOSIS — M542 Cervicalgia: Secondary | ICD-10-CM | POA: Diagnosis not present

## 2018-05-08 DIAGNOSIS — S2220XA Unspecified fracture of sternum, initial encounter for closed fracture: Secondary | ICD-10-CM | POA: Diagnosis not present

## 2018-05-08 DIAGNOSIS — S92011B Displaced fracture of body of right calcaneus, initial encounter for open fracture: Secondary | ICD-10-CM | POA: Diagnosis not present

## 2018-05-08 DIAGNOSIS — G8918 Other acute postprocedural pain: Secondary | ICD-10-CM | POA: Diagnosis not present

## 2018-05-08 DIAGNOSIS — Z01818 Encounter for other preprocedural examination: Secondary | ICD-10-CM | POA: Diagnosis not present

## 2018-05-08 DIAGNOSIS — I10 Essential (primary) hypertension: Secondary | ICD-10-CM | POA: Diagnosis not present

## 2018-05-08 DIAGNOSIS — G8911 Acute pain due to trauma: Secondary | ICD-10-CM | POA: Diagnosis not present

## 2018-05-08 DIAGNOSIS — R51 Headache: Secondary | ICD-10-CM | POA: Diagnosis not present

## 2018-05-08 DIAGNOSIS — S92001A Unspecified fracture of right calcaneus, initial encounter for closed fracture: Secondary | ICD-10-CM | POA: Diagnosis not present

## 2018-05-08 DIAGNOSIS — R52 Pain, unspecified: Secondary | ICD-10-CM | POA: Diagnosis not present

## 2018-05-08 DIAGNOSIS — R109 Unspecified abdominal pain: Secondary | ICD-10-CM | POA: Diagnosis not present

## 2018-05-08 DIAGNOSIS — S0081XA Abrasion of other part of head, initial encounter: Secondary | ICD-10-CM | POA: Diagnosis not present

## 2018-05-08 DIAGNOSIS — R918 Other nonspecific abnormal finding of lung field: Secondary | ICD-10-CM | POA: Diagnosis not present

## 2018-05-08 DIAGNOSIS — E039 Hypothyroidism, unspecified: Secondary | ICD-10-CM | POA: Diagnosis not present

## 2018-05-08 DIAGNOSIS — S92001B Unspecified fracture of right calcaneus, initial encounter for open fracture: Secondary | ICD-10-CM | POA: Diagnosis not present

## 2018-05-08 DIAGNOSIS — S3993XA Unspecified injury of pelvis, initial encounter: Secondary | ICD-10-CM | POA: Diagnosis not present

## 2018-05-09 DIAGNOSIS — S92001B Unspecified fracture of right calcaneus, initial encounter for open fracture: Secondary | ICD-10-CM | POA: Diagnosis not present

## 2018-05-09 DIAGNOSIS — M25571 Pain in right ankle and joints of right foot: Secondary | ICD-10-CM | POA: Diagnosis not present

## 2018-05-09 DIAGNOSIS — G8918 Other acute postprocedural pain: Secondary | ICD-10-CM | POA: Diagnosis not present

## 2018-05-09 DIAGNOSIS — S2220XA Unspecified fracture of sternum, initial encounter for closed fracture: Secondary | ICD-10-CM | POA: Diagnosis not present

## 2018-05-10 DIAGNOSIS — S0081XA Abrasion of other part of head, initial encounter: Secondary | ICD-10-CM | POA: Diagnosis not present

## 2018-05-10 DIAGNOSIS — S2220XA Unspecified fracture of sternum, initial encounter for closed fracture: Secondary | ICD-10-CM | POA: Diagnosis not present

## 2018-05-10 DIAGNOSIS — E039 Hypothyroidism, unspecified: Secondary | ICD-10-CM | POA: Diagnosis not present

## 2018-05-10 DIAGNOSIS — S20219A Contusion of unspecified front wall of thorax, initial encounter: Secondary | ICD-10-CM | POA: Diagnosis not present

## 2018-05-11 DIAGNOSIS — S0081XA Abrasion of other part of head, initial encounter: Secondary | ICD-10-CM | POA: Diagnosis not present

## 2018-05-11 DIAGNOSIS — E039 Hypothyroidism, unspecified: Secondary | ICD-10-CM | POA: Diagnosis not present

## 2018-05-11 DIAGNOSIS — S20219A Contusion of unspecified front wall of thorax, initial encounter: Secondary | ICD-10-CM | POA: Diagnosis not present

## 2018-05-11 DIAGNOSIS — S2220XA Unspecified fracture of sternum, initial encounter for closed fracture: Secondary | ICD-10-CM | POA: Diagnosis not present

## 2018-05-11 DIAGNOSIS — R918 Other nonspecific abnormal finding of lung field: Secondary | ICD-10-CM | POA: Diagnosis not present

## 2018-05-12 DIAGNOSIS — S20219A Contusion of unspecified front wall of thorax, initial encounter: Secondary | ICD-10-CM | POA: Diagnosis not present

## 2018-05-12 DIAGNOSIS — E039 Hypothyroidism, unspecified: Secondary | ICD-10-CM | POA: Diagnosis not present

## 2018-05-12 DIAGNOSIS — S0081XA Abrasion of other part of head, initial encounter: Secondary | ICD-10-CM | POA: Diagnosis not present

## 2018-05-12 DIAGNOSIS — S2220XA Unspecified fracture of sternum, initial encounter for closed fracture: Secondary | ICD-10-CM | POA: Diagnosis not present

## 2018-05-13 DIAGNOSIS — S2220XA Unspecified fracture of sternum, initial encounter for closed fracture: Secondary | ICD-10-CM | POA: Diagnosis not present

## 2018-05-13 DIAGNOSIS — S0081XA Abrasion of other part of head, initial encounter: Secondary | ICD-10-CM | POA: Diagnosis not present

## 2018-05-13 DIAGNOSIS — G8911 Acute pain due to trauma: Secondary | ICD-10-CM | POA: Diagnosis not present

## 2018-05-21 DIAGNOSIS — S92001B Unspecified fracture of right calcaneus, initial encounter for open fracture: Secondary | ICD-10-CM | POA: Diagnosis not present

## 2018-06-18 ENCOUNTER — Encounter: Payer: BLUE CROSS/BLUE SHIELD | Admitting: Women's Health

## 2018-06-18 DIAGNOSIS — S92011D Displaced fracture of body of right calcaneus, subsequent encounter for fracture with routine healing: Secondary | ICD-10-CM | POA: Diagnosis not present
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
## 2018-07-02 ENCOUNTER — Ambulatory Visit (INDEPENDENT_AMBULATORY_CARE_PROVIDER_SITE_OTHER): Payer: BLUE CROSS/BLUE SHIELD | Admitting: Rheumatology

## 2018-07-02 ENCOUNTER — Encounter: Payer: Self-pay | Admitting: Rheumatology

## 2018-07-02 VITALS — BP 101/65 | HR 72 | Resp 12 | Ht 64.0 in | Wt 144.0 lb

## 2018-07-02 DIAGNOSIS — M170 Bilateral primary osteoarthritis of knee: Secondary | ICD-10-CM | POA: Diagnosis not present

## 2018-07-02 DIAGNOSIS — I7300 Raynaud's syndrome without gangrene: Secondary | ICD-10-CM

## 2018-07-02 DIAGNOSIS — L409 Psoriasis, unspecified: Secondary | ICD-10-CM

## 2018-07-02 DIAGNOSIS — M19071 Primary osteoarthritis, right ankle and foot: Secondary | ICD-10-CM

## 2018-07-02 DIAGNOSIS — Z8659 Personal history of other mental and behavioral disorders: Secondary | ICD-10-CM

## 2018-07-02 DIAGNOSIS — M19041 Primary osteoarthritis, right hand: Secondary | ICD-10-CM

## 2018-07-02 DIAGNOSIS — Z8679 Personal history of other diseases of the circulatory system: Secondary | ICD-10-CM

## 2018-07-02 DIAGNOSIS — L298 Other pruritus: Secondary | ICD-10-CM

## 2018-07-02 DIAGNOSIS — Z8639 Personal history of other endocrine, nutritional and metabolic disease: Secondary | ICD-10-CM

## 2018-07-02 DIAGNOSIS — M7062 Trochanteric bursitis, left hip: Secondary | ICD-10-CM | POA: Diagnosis not present

## 2018-07-02 DIAGNOSIS — M19072 Primary osteoarthritis, left ankle and foot: Secondary | ICD-10-CM

## 2018-07-02 DIAGNOSIS — M19042 Primary osteoarthritis, left hand: Secondary | ICD-10-CM

## 2018-07-02 MED ORDER — HYDROCORTISONE 2.5 % EX LOTN: TOPICAL_LOTION | Freq: Two times a day (BID) | CUTANEOUS | 0 refills | Status: DC

## 2018-07-08 ENCOUNTER — Other Ambulatory Visit: Payer: Self-pay

## 2018-07-08 MED ORDER — ESTRADIOL 0.5 MG PO TABS: 0.5000 mg | ORAL_TABLET | Freq: Every day | ORAL | 0 refills | Status: DC

## 2018-07-08 NOTE — Telephone Encounter (Signed)
CE scheduled 08/14/18.

## 2018-08-05 ENCOUNTER — Ambulatory Visit (INDEPENDENT_AMBULATORY_CARE_PROVIDER_SITE_OTHER): Payer: BLUE CROSS/BLUE SHIELD | Admitting: Women's Health

## 2018-08-05 ENCOUNTER — Encounter: Payer: Self-pay | Admitting: Women's Health

## 2018-08-05 VITALS — BP 120/78 | Ht 64.0 in

## 2018-08-05 DIAGNOSIS — Z13820 Encounter for screening for osteoporosis: Secondary | ICD-10-CM

## 2018-08-05 DIAGNOSIS — Z01419 Encounter for gynecological examination (general) (routine) without abnormal findings: Secondary | ICD-10-CM

## 2018-08-05 MED ORDER — ESTRADIOL 0.5 MG PO TABS: 0.5000 mg | ORAL_TABLET | Freq: Every day | ORAL | 0 refills | Status: DC

## 2018-08-05 MED ORDER — PROGESTERONE MICRONIZED 200 MG PO CAPS: 200.0000 mg | ORAL_CAPSULE | Freq: Every day | ORAL | 4 refills | Status: DC
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
## 2018-08-05 NOTE — Patient Instructions (Signed)
Health Maintenance for Postmenopausal Women Menopause is a normal process in which your reproductive ability comes to an end. This process happens gradually over a span of months to years, usually between the ages of 22 and 9. Menopause is complete when you have missed 12 consecutive menstrual periods. It is important to talk with your health care provider about some of the most common conditions that affect postmenopausal women, such as heart disease, cancer, and bone loss (osteoporosis). Adopting a healthy lifestyle and getting preventive care can help to promote your health and wellness. Those actions can also lower your chances of developing some of these common conditions. What should I know about menopause? During menopause, you may experience a number of symptoms, such as:  Moderate-to-severe hot flashes.  Night sweats.  Decrease in sex drive.  Mood swings.  Headaches.  Tiredness.  Irritability.  Memory problems.  Insomnia.  Choosing to treat or not to treat menopausal changes is an individual decision that you make with your health care provider. What should I know about hormone replacement therapy and supplements? Hormone therapy products are effective for treating symptoms that are associated with menopause, such as hot flashes and night sweats. Hormone replacement carries certain risks, especially as you become older. If you are thinking about using estrogen or estrogen with progestin treatments, discuss the benefits and risks with your health care provider. What should I know about heart disease and stroke? Heart disease, heart attack, and stroke become more likely as you age. This may be due, in part, to the hormonal changes that your body experiences during menopause. These can affect how your body processes dietary fats, triglycerides, and cholesterol. Heart attack and stroke are both medical emergencies. There are many things that you can do to help prevent heart disease  and stroke:  Have your blood pressure checked at least every 1-2 years. High blood pressure causes heart disease and increases the risk of stroke.  If you are 53-22 years old, ask your health care provider if you should take aspirin to prevent a heart attack or a stroke.  Do not use any tobacco products, including cigarettes, chewing tobacco, or electronic cigarettes. If you need help quitting, ask your health care provider.  It is important to eat a healthy diet and maintain a healthy weight. ? Be sure to include plenty of vegetables, fruits, low-fat dairy products, and lean protein. ? Avoid eating foods that are high in solid fats, added sugars, or salt (sodium).  Get regular exercise. This is one of the most important things that you can do for your health. ? Try to exercise for at least 150 minutes each week. The type of exercise that you do should increase your heart rate and make you sweat. This is known as moderate-intensity exercise. ? Try to do strengthening exercises at least twice each week. Do these in addition to the moderate-intensity exercise.  Know your numbers.Ask your health care provider to check your cholesterol and your blood glucose. Continue to have your blood tested as directed by your health care provider.  What should I know about cancer screening? There are several types of cancer. Take the following steps to reduce your risk and to catch any cancer development as early as possible. Breast Cancer  Practice breast self-awareness. ? This means understanding how your breasts normally appear and feel. ? It also means doing regular breast self-exams. Let your health care provider know about any changes, no matter how small.  If you are 40  or older, have a clinician do a breast exam (clinical breast exam or CBE) every year. Depending on your age, family history, and medical history, it may be recommended that you also have a yearly breast X-ray (mammogram).  If you  have a family history of breast cancer, talk with your health care provider about genetic screening.  If you are at high risk for breast cancer, talk with your health care provider about having an MRI and a mammogram every year.  Breast cancer (BRCA) gene test is recommended for women who have family members with BRCA-related cancers. Results of the assessment will determine the need for genetic counseling and BRCA1 and for BRCA2 testing. BRCA-related cancers include these types: ? Breast. This occurs in males or females. ? Ovarian. ? Tubal. This may also be called fallopian tube cancer. ? Cancer of the abdominal or pelvic lining (peritoneal cancer). ? Prostate. ? Pancreatic.  Cervical, Uterine, and Ovarian Cancer Your health care provider may recommend that you be screened regularly for cancer of the pelvic organs. These include your ovaries, uterus, and vagina. This screening involves a pelvic exam, which includes checking for microscopic changes to the surface of your cervix (Pap test).  For women ages 21-65, health care providers may recommend a pelvic exam and a Pap test every three years. For women ages 79-65, they may recommend the Pap test and pelvic exam, combined with testing for human papilloma virus (HPV), every five years. Some types of HPV increase your risk of cervical cancer. Testing for HPV may also be done on women of any age who have unclear Pap test results.  Other health care providers may not recommend any screening for nonpregnant women who are considered low risk for pelvic cancer and have no symptoms. Ask your health care provider if a screening pelvic exam is right for you.  If you have had past treatment for cervical cancer or a condition that could lead to cancer, you need Pap tests and screening for cancer for at least 20 years after your treatment. If Pap tests have been discontinued for you, your risk factors (such as having a new sexual partner) need to be  reassessed to determine if you should start having screenings again. Some women have medical problems that increase the chance of getting cervical cancer. In these cases, your health care provider may recommend that you have screening and Pap tests more often.  If you have a family history of uterine cancer or ovarian cancer, talk with your health care provider about genetic screening.  If you have vaginal bleeding after reaching menopause, tell your health care provider.  There are currently no reliable tests available to screen for ovarian cancer.  Lung Cancer Lung cancer screening is recommended for adults 69-62 years old who are at high risk for lung cancer because of a history of smoking. A yearly low-dose CT scan of the lungs is recommended if you:  Currently smoke.  Have a history of at least 30 pack-years of smoking and you currently smoke or have quit within the past 15 years. A pack-year is smoking an average of one pack of cigarettes per day for one year.  Yearly screening should:  Continue until it has been 15 years since you quit.  Stop if you develop a health problem that would prevent you from having lung cancer treatment.  Colorectal Cancer  This type of cancer can be detected and can often be prevented.  Routine colorectal cancer screening usually begins at  age 42 and continues through age 45.  If you have risk factors for colon cancer, your health care provider may recommend that you be screened at an earlier age.  If you have a family history of colorectal cancer, talk with your health care provider about genetic screening.  Your health care provider may also recommend using home test kits to check for hidden blood in your stool.  A small camera at the end of a tube can be used to examine your colon directly (sigmoidoscopy or colonoscopy). This is done to check for the earliest forms of colorectal cancer.  Direct examination of the colon should be repeated every  5-10 years until age 71. However, if early forms of precancerous polyps or small growths are found or if you have a family history or genetic risk for colorectal cancer, you may need to be screened more often.  Skin Cancer  Check your skin from head to toe regularly.  Monitor any moles. Be sure to tell your health care provider: ? About any new moles or changes in moles, especially if there is a change in a mole's shape or color. ? If you have a mole that is larger than the size of a pencil eraser.  If any of your family members has a history of skin cancer, especially at a Arora Coakley age, talk with your health care provider about genetic screening.  Always use sunscreen. Apply sunscreen liberally and repeatedly throughout the day.  Whenever you are outside, protect yourself by wearing long sleeves, pants, a wide-brimmed hat, and sunglasses.  What should I know about osteoporosis? Osteoporosis is a condition in which bone destruction happens more quickly than new bone creation. After menopause, you may be at an increased risk for osteoporosis. To help prevent osteoporosis or the bone fractures that can happen because of osteoporosis, the following is recommended:  If you are 46-71 years old, get at least 1,000 mg of calcium and at least 600 mg of vitamin D per day.  If you are older than age 55 but younger than age 65, get at least 1,200 mg of calcium and at least 600 mg of vitamin D per day.  If you are older than age 54, get at least 1,200 mg of calcium and at least 800 mg of vitamin D per day.  Smoking and excessive alcohol intake increase the risk of osteoporosis. Eat foods that are rich in calcium and vitamin D, and do weight-bearing exercises several times each week as directed by your health care provider. What should I know about how menopause affects my mental health? Depression may occur at any age, but it is more common as you become older. Common symptoms of depression  include:  Low or sad mood.  Changes in sleep patterns.  Changes in appetite or eating patterns.  Feeling an overall lack of motivation or enjoyment of activities that you previously enjoyed.  Frequent crying spells.  Talk with your health care provider if you think that you are experiencing depression. What should I know about immunizations? It is important that you get and maintain your immunizations. These include:  Tetanus, diphtheria, and pertussis (Tdap) booster vaccine.  Influenza every year before the flu season begins.  Pneumonia vaccine.  Shingles vaccine.  Your health care provider may also recommend other immunizations. This information is not intended to replace advice given to you by your health care provider. Make sure you discuss any questions you have with your health care provider. Document Released: 01/04/2006  Document Revised: 06/01/2016 Document Reviewed: 08/16/2015 Elsevier Interactive Patient Education  2018 [employer] Inc[employer]  

## 2018-08-06 DIAGNOSIS — S92061D Displaced intraarticular fracture of right calcaneus, subsequent encounter for fracture with routine healing: Secondary | ICD-10-CM | POA: Diagnosis not present

## 2018-08-18 DIAGNOSIS — S92061D Displaced intraarticular fracture of right calcaneus, subsequent encounter for fracture with routine healing: Secondary | ICD-10-CM | POA: Diagnosis not present

## 2018-08-26 DIAGNOSIS — M8580 Other specified disorders of bone density and structure, unspecified site: Secondary | ICD-10-CM

## 2018-08-26 DIAGNOSIS — M79671 Pain in right foot: Secondary | ICD-10-CM | POA: Diagnosis not present

## 2018-08-26 HISTORY — DX: Other specified disorders of bone density and structure, unspecified site: M85.80

## 2018-08-27 ENCOUNTER — Ambulatory Visit (INDEPENDENT_AMBULATORY_CARE_PROVIDER_SITE_OTHER): Payer: BLUE CROSS/BLUE SHIELD

## 2018-08-27 ENCOUNTER — Other Ambulatory Visit: Payer: Self-pay | Admitting: Women's Health

## 2018-08-27 DIAGNOSIS — M8589 Other specified disorders of bone density and structure, multiple sites: Secondary | ICD-10-CM

## 2018-08-27 DIAGNOSIS — Z13820 Encounter for screening for osteoporosis: Secondary | ICD-10-CM

## 2018-08-28 ENCOUNTER — Encounter: Payer: Self-pay | Admitting: Gynecology

## 2018-08-29 DIAGNOSIS — M79671 Pain in right foot: Secondary | ICD-10-CM | POA: Diagnosis not present

## 2018-09-02 DIAGNOSIS — K219 Gastro-esophageal reflux disease without esophagitis: Secondary | ICD-10-CM | POA: Diagnosis not present

## 2018-09-02 DIAGNOSIS — K580 Irritable bowel syndrome with diarrhea: Secondary | ICD-10-CM | POA: Diagnosis not present

## 2018-09-02 DIAGNOSIS — R1310 Dysphagia, unspecified: Secondary | ICD-10-CM | POA: Diagnosis not present

## 2018-09-02 DIAGNOSIS — M79671 Pain in right foot: Secondary | ICD-10-CM | POA: Diagnosis not present

## 2018-09-02 DIAGNOSIS — Z86010 Personal history of colonic polyps: Secondary | ICD-10-CM | POA: Diagnosis not present

## 2018-09-05 DIAGNOSIS — M79671 Pain in right foot: Secondary | ICD-10-CM | POA: Diagnosis not present

## 2018-09-09 DIAGNOSIS — M79671 Pain in right foot: Secondary | ICD-10-CM | POA: Diagnosis not present

## 2018-09-12 DIAGNOSIS — M79671 Pain in right foot: Secondary | ICD-10-CM | POA: Diagnosis not present

## 2018-09-16 DIAGNOSIS — M79671 Pain in right foot: Secondary | ICD-10-CM | POA: Diagnosis not present

## 2018-09-17 DIAGNOSIS — M79671 Pain in right foot: Secondary | ICD-10-CM | POA: Diagnosis not present

## 2018-09-17 DIAGNOSIS — S92061D Displaced intraarticular fracture of right calcaneus, subsequent encounter for fracture with routine healing: Secondary | ICD-10-CM | POA: Diagnosis not present

## 2018-09-22 DIAGNOSIS — M79671 Pain in right foot: Secondary | ICD-10-CM | POA: Diagnosis not present

## 2018-09-23 DIAGNOSIS — K219 Gastro-esophageal reflux disease without esophagitis: Secondary | ICD-10-CM | POA: Diagnosis not present

## 2018-09-23 DIAGNOSIS — K580 Irritable bowel syndrome with diarrhea: Secondary | ICD-10-CM | POA: Diagnosis not present

## 2018-09-23 DIAGNOSIS — Z86010 Personal history of colonic polyps: Secondary | ICD-10-CM | POA: Diagnosis not present

## 2018-09-30 ENCOUNTER — Encounter: Payer: Self-pay | Admitting: Internal Medicine

## 2018-09-30 ENCOUNTER — Ambulatory Visit (INDEPENDENT_AMBULATORY_CARE_PROVIDER_SITE_OTHER): Payer: BLUE CROSS/BLUE SHIELD | Admitting: Internal Medicine

## 2018-09-30 VITALS — BP 118/60 | HR 60 | Ht 64.0 in | Wt 146.0 lb

## 2018-09-30 DIAGNOSIS — E042 Nontoxic multinodular goiter: Secondary | ICD-10-CM

## 2018-09-30 DIAGNOSIS — E890 Postprocedural hypothyroidism: Secondary | ICD-10-CM | POA: Diagnosis not present

## 2018-09-30 DIAGNOSIS — M79671 Pain in right foot: Secondary | ICD-10-CM | POA: Diagnosis not present
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
## 2018-09-30 NOTE — Patient Instructions (Signed)
Please continue: - Armour 90 mg 1/7 days and 60 mg 6/7 days  Please move calcium at least 4h after Armour.  Stop B complex at least 1 week before labs.  Take the thyroid hormone every day, with water, at least 30 minutes before breakfast, separated by at least 4 hours from: - acid reflux medications - calcium - iron - multivitamins  Please return in 1.5 months for labs and in 1 year for an appt.

## 2018-10-03 ENCOUNTER — Other Ambulatory Visit: Payer: BLUE CROSS/BLUE SHIELD

## 2018-10-06 ENCOUNTER — Ambulatory Visit
Admission: RE | Admit: 2018-10-06 | Discharge: 2018-10-06 | Disposition: A | Discharge status: Home / Self Care | Payer: BLUE CROSS/BLUE SHIELD | Source: Ambulatory Visit | Attending: Internal Medicine | Admitting: Internal Medicine

## 2018-10-06 DIAGNOSIS — M79671 Pain in right foot: Secondary | ICD-10-CM | POA: Diagnosis not present

## 2018-10-06 DIAGNOSIS — E041 Nontoxic single thyroid nodule: Secondary | ICD-10-CM | POA: Diagnosis not present

## 2018-10-07 ENCOUNTER — Ambulatory Visit: Payer: BLUE CROSS/BLUE SHIELD | Admitting: Rheumatology

## 2018-10-09 DIAGNOSIS — M79671 Pain in right foot: Secondary | ICD-10-CM | POA: Diagnosis not present

## 2018-10-13 DIAGNOSIS — M79671 Pain in right foot: Secondary | ICD-10-CM | POA: Diagnosis not present

## 2018-10-14 DIAGNOSIS — K580 Irritable bowel syndrome with diarrhea: Secondary | ICD-10-CM | POA: Diagnosis not present

## 2018-10-14 DIAGNOSIS — Z1211 Encounter for screening for malignant neoplasm of colon: Secondary | ICD-10-CM | POA: Diagnosis not present

## 2018-10-14 DIAGNOSIS — Z86010 Personal history of colonic polyps: Secondary | ICD-10-CM | POA: Diagnosis not present

## 2018-10-14 DIAGNOSIS — K219 Gastro-esophageal reflux disease without esophagitis: Secondary | ICD-10-CM | POA: Diagnosis not present

## 2018-10-16 DIAGNOSIS — M79671 Pain in right foot: Secondary | ICD-10-CM | POA: Diagnosis not present

## 2018-10-20 DIAGNOSIS — M79671 Pain in right foot: Secondary | ICD-10-CM | POA: Diagnosis not present

## 2018-10-29 DIAGNOSIS — M79671 Pain in right foot: Secondary | ICD-10-CM | POA: Diagnosis not present

## 2018-10-29 DIAGNOSIS — S92002D Unspecified fracture of left calcaneus, subsequent encounter for fracture with routine healing: Secondary | ICD-10-CM | POA: Diagnosis not present

## 2018-10-30 DIAGNOSIS — M79671 Pain in right foot: Secondary | ICD-10-CM | POA: Diagnosis not present

## 2018-11-08 ENCOUNTER — Other Ambulatory Visit: Payer: Self-pay | Admitting: Internal Medicine

## 2018-11-10 ENCOUNTER — Other Ambulatory Visit (INDEPENDENT_AMBULATORY_CARE_PROVIDER_SITE_OTHER): Payer: BLUE CROSS/BLUE SHIELD

## 2018-11-10 ENCOUNTER — Encounter: Payer: Self-pay | Admitting: Women's Health

## 2018-11-10 DIAGNOSIS — D128 Benign neoplasm of rectum: Secondary | ICD-10-CM | POA: Diagnosis not present

## 2018-11-10 DIAGNOSIS — E890 Postprocedural hypothyroidism: Secondary | ICD-10-CM

## 2018-11-10 DIAGNOSIS — Z1211 Encounter for screening for malignant neoplasm of colon: Secondary | ICD-10-CM | POA: Diagnosis not present

## 2018-11-10 DIAGNOSIS — E042 Nontoxic multinodular goiter: Secondary | ICD-10-CM | POA: Diagnosis not present

## 2018-11-10 DIAGNOSIS — D124 Benign neoplasm of descending colon: Secondary | ICD-10-CM | POA: Diagnosis not present

## 2018-11-10 DIAGNOSIS — K621 Rectal polyp: Secondary | ICD-10-CM | POA: Diagnosis not present

## 2018-11-10 DIAGNOSIS — K635 Polyp of colon: Secondary | ICD-10-CM | POA: Diagnosis not present

## 2018-11-10 LAB — T4, FREE: Free T4: 0.64 ng/dL (ref 0.60–1.60)

## 2018-11-10 LAB — T3, FREE: T3 FREE: 3.9 pg/mL (ref 2.3–4.2)

## 2018-11-10 LAB — TSH: TSH: 1.15 u[IU]/mL (ref 0.35–4.50)

## 2018-12-23 NOTE — Progress Notes (Deleted)
Office Visit Note  Patient: [name]             Date of Birth: 1956/01/14           MRN: [id number]             PCP: Patient, No Pcp Per Referring: No ref. provider found Visit Date: 01/06/2019 Occupation: @GUAROCC @  Subjective:  No chief complaint on file.   History of Present Illness: [name] is a 63 y.o. female ***   Activities of Daily Living:  Patient reports morning stiffness for *** {minute/hour:19697}.   Patient {ACTIONS;DENIES/REPORTS:21021675::"Denies"} nocturnal pain.  Difficulty dressing/grooming: {ACTIONS;DENIES/REPORTS:21021675::"Denies"} Difficulty climbing stairs: {ACTIONS;DENIES/REPORTS:21021675::"Denies"} Difficulty getting out of chair: {ACTIONS;DENIES/REPORTS:21021675::"Denies"} Difficulty using hands for taps, buttons, cutlery, and/or writing: {ACTIONS;DENIES/REPORTS:21021675::"Denies"}  No Rheumatology ROS completed.   PMFS History:  Patient Active Problem List   Diagnosis Date Noted  . Psoriasis 04/03/2018  . Postablative hypothyroidism 01/30/2017  . Reactive arthritis ([place]) 11/01/2016  . Primary osteoarthritis of both knees 11/01/2016  . Primary osteoarthritis of both hands 11/01/2016  . Primary osteoarthritis of both feet 11/01/2016  . Leukocytosis   . HTN (hypertension) 05/24/2015  . Hyperlipidemia 05/24/2015  . C. difficile colitis 05/24/2015  . Hypokalemia 05/24/2015  . Hyperglycemia 05/24/2015  . Pain of right thigh 10/20/2014  . Other iron deficiency anemias 08/05/2014  . Palpitations 08/21/2013  . Personal history of endometriosis 02/04/2013  . Fibroid uterus 02/04/2013  . Varicose veins of lower extremities with other complications 39/76/7341  . Varicosities of leg 03/10/2012  . Multiple thyroid nodules 01/22/2012    Past Medical History:  Diagnosis Date  . Arrhythmia 2013   PVCs,-monitor  . Arthritis   . Colon polyps 2008   BENIGN  . Depression    PMDD  . Hyperlipidemia   . Kidney infection   . Osteopenia  08/2018   T score -1.5 FRAX 7.3% / 0.4%  . Palpitations   . Postablative hypothyroidism   . Raynaud's phenomenon   . Rotator cuff disorder    TEAR-    Family History  Problem Relation Age of Onset  . Hypertension Father   . Lung cancer Father   . Brain cancer Father   . Hypertension Mother   . Heart attack Mother 60  . Osteoporosis Mother   . Obesity Brother   . Obesity Brother   . Heart attack Maternal Grandfather   . Migraines Daughter    Past Surgical History:  Procedure Laterality Date  . DOPPLER ECHOCARDIOGRAPHY  08/14/2008   EF => 55% , no valvular pathology  . EVENT MONITOR  08/15/2012-08/28/2012   NSR with PVC's ,PSVT vs Aflutter WITH 2:1  . HAND SURGERY Left   . NM MYOCAR PERF WALL MOTION  09/04/2012   EF 66% ,LV normal, exercise [place] 11 mets -completeing stae 3 and 1 minute into stage 4,developed some mild chest pain and throat  tightness caused to stop exercise. [facility] TM scor  2 - MOD RISK  . OOPHORECTOMY  2010   LAP RSO  . right heel surgery Right 04/2018  . ROTATOR CUFF REPAIR    . THYROID LOBECTOMY     RIGHT   Social History   Social History Narrative  . Not on file   Immunization History  Administered Date(s) Administered  . Influenza,inj,Quad PF,6+ Mos 12/27/2016  . Tdap 01/23/2013     Objective: Vital Signs: LMP 03/22/2015    Physical Exam   Musculoskeletal Exam: ***  CDAI Exam: CDAI Score: Not documented  Patient Global Assessment: Not documented; Provider Global Assessment: Not documented Swollen: Not documented; Tender: Not documented Joint Exam   Not documented   There is currently no information documented on the homunculus. Go to the Rheumatology activity and complete the homunculus joint exam.  Investigation: No additional findings.  Imaging: No results found.  Recent Labs: Lab Results  Component Value Date   WBC 5.4 06/17/2017   HGB 11.8 06/17/2017   PLT 299 06/17/2017   NA 140 06/17/2017   K 4.4 06/17/2017   CL  104 06/17/2017   CO2 29 06/17/2017   GLUCOSE 83 06/17/2017   BUN 16 06/17/2017   CREATININE 0.72 06/17/2017   BILITOT 0.4 06/17/2017   ALKPHOS 57 06/17/2017   AST 24 06/17/2017   ALT 24 06/17/2017   PROT 6.4 06/17/2017   ALBUMIN 3.9 06/17/2017   CALCIUM 9.0 06/17/2017   GFRAA >60 05/25/2015    Speciality Comments: No specialty comments available.  Procedures:  No procedures performed Allergies: Lomotil [diphenoxylate]   Assessment / Plan:     Visit Diagnoses: Primary osteoarthritis of both hands  Primary osteoarthritis of both knees  Primary osteoarthritis of both feet  Trochanteric bursitis of left hip  Psoriasis  Raynaud's disease without gangrene  History of hypertension  History of hypothyroidism  History of hyperlipidemia  History of attention deficit disorder   Orders: No orders of the defined types were placed in this encounter.  No orders of the defined types were placed in this encounter.   Face-to-face time spent with patient was *** minutes. Greater than 50% of time was spent in counseling and coordination of care.  Follow-Up Instructions: No follow-ups on file.   Ofilia Neas, PA-C  Note - This record has been created using Dragon software.  Chart creation errors have been sought, but may not always  have been located. Such creation errors do not reflect on  the standard of medical care.

## 2018-12-24 ENCOUNTER — Telehealth: Payer: Self-pay | Admitting: *Deleted

## 2018-12-24 DIAGNOSIS — N390 Urinary tract infection, site not specified: Secondary | ICD-10-CM

## 2018-12-24 MED ORDER — PHENAZOPYRIDINE HCL 200 MG PO TABS: 200.0000 mg | ORAL_TABLET | Freq: Three times a day (TID) | ORAL | 0 refills | Status: DC | PRN

## 2018-12-24 NOTE — Telephone Encounter (Signed)
Patient called currently in [place] c/o UTI will be back in town late this evening asked if you would be willing to send in Rx for UTI to start tonight? Please advise

## 2018-12-24 NOTE — Telephone Encounter (Signed)
Patient informed with the below note, Rx sent for pyridium, patient will come in am for leave u/a

## 2018-12-24 NOTE — Telephone Encounter (Signed)
Best to get a UA first thing in am, she can come in for lab only UA [place] am, pl have [name] run in office and I will call her with results.   can try pyridium tonight which will help the symptoms, pyridium 200 mg TID #15,  Is she having any vag discharge, nausea or fever? Hope her trip was good.

## 2018-12-25 ENCOUNTER — Encounter: Payer: Self-pay | Admitting: Gynecology

## 2018-12-25 ENCOUNTER — Ambulatory Visit: Payer: BLUE CROSS/BLUE SHIELD | Admitting: Gynecology

## 2018-12-25 ENCOUNTER — Other Ambulatory Visit: Payer: Self-pay | Admitting: *Deleted

## 2018-12-25 ENCOUNTER — Ambulatory Visit (INDEPENDENT_AMBULATORY_CARE_PROVIDER_SITE_OTHER): Payer: BLUE CROSS/BLUE SHIELD | Admitting: Gynecology

## 2018-12-25 ENCOUNTER — Other Ambulatory Visit: Payer: BLUE CROSS/BLUE SHIELD

## 2018-12-25 VITALS — BP 122/78

## 2018-12-25 DIAGNOSIS — N390 Urinary tract infection, site not specified: Secondary | ICD-10-CM | POA: Diagnosis not present

## 2018-12-25 DIAGNOSIS — N3001 Acute cystitis with hematuria: Secondary | ICD-10-CM | POA: Diagnosis not present

## 2018-12-25 MED ORDER — CIPROFLOXACIN HCL 250 MG PO TABS: ORAL_TABLET | ORAL | 0 refills | Status: DC

## 2018-12-25 NOTE — Progress Notes (Signed)
    [name] May 26, 1956 [id number]        63 y.o.  G2P2002 presents with several days of worsening frequency, dysuria and urgency.  Now with low back pain.  No fever or chills.  Past medical history,surgical history, problem list, medications, allergies, family history and social history were all reviewed and documented in the EPIC chart.  Directed ROS with pertinent positives and negatives documented in the history of present illness/assessment and plan.  Exam: Vitals:   12/25/18 1000  BP: 122/78   General appearance:  Normal Spine straight with right CVA tenderness. Abdomen soft with minimal suprapubic tenderness.  No guarding, masses or rebound  Assessment/Plan:  63 y.o. A1P3790 with UTI early pyelonephritis.  Will treat with ciprofloxacin 500 mg twice daily x2 days then to 50 mg twice daily x7-day course.  Black box warning reviewed.  Will call if symptoms are not improving over the next 24 hours.  We follow-up if symptoms not totally gone by 7 days.    Anastasio Auerbach MD, 10:25 AM 12/25/2018

## 2018-12-25 NOTE — Patient Instructions (Signed)
Take the ciprofloxacin antibiotic 2 pills twice daily for 2 days then 1 pill twice daily until gone.  Follow-up if your symptoms persist, worsen or recur.

## 2018-12-27 LAB — URINE CULTURE
MICRO NUMBER:: 133683
SPECIMEN QUALITY:: ADEQUATE

## 2018-12-27 LAB — URINALYSIS, COMPLETE W/RFL CULTURE: Hyaline Cast: NONE SEEN /LPF

## 2018-12-27 LAB — CULTURE INDICATED

## 2019-01-05 NOTE — Progress Notes (Signed)
Office Visit Note  Patient: [name]             Date of Birth: [birth year]           MRN: [id number]             PCP: Patient, No Pcp Per Referring: No ref. provider found Visit Date: 01/09/2019 Occupation: @GUAROCC @  Subjective:  Cyst on left index finger  History of Present Illness: [name] is a 63 y.o. female with history of osteoarthritis and Raynaud's.  She denies any joint pain or joint swelling.  She has not had any symptoms of Raynaud's recently.  She denies any digital ulcerations.  She states that her psoriasis is well controlled with topical agents.  She denies any concerns at this time. She has started doing yoga on a regular basis. She has a cyst on the left index finger.  Activities of Daily Living:  Patient reports morning stiffness for 0 minutes.   Patient Denies nocturnal pain.  Difficulty dressing/grooming: Denies Difficulty climbing stairs: Denies Difficulty getting out of chair: Denies Difficulty using hands for taps, buttons, cutlery, and/or writing: Denies  Review of Systems  Constitutional: Negative for fatigue.  HENT: Negative for mouth sores, mouth dryness and nose dryness.   Eyes: Negative for pain, itching, visual disturbance and dryness.  Respiratory: Negative for cough, hemoptysis, shortness of breath, wheezing and difficulty breathing.   Cardiovascular: Negative for chest pain, palpitations, hypertension and swelling in legs/feet.  Gastrointestinal: Negative for abdominal pain, blood in stool, constipation and diarrhea.  Endocrine: Negative for increased urination.  Genitourinary: Negative for painful urination.  Musculoskeletal: Negative for arthralgias, joint pain, joint swelling, myalgias, muscle weakness, morning stiffness, muscle tenderness and myalgias.  Skin: Negative for color change, pallor, rash, hair loss, nodules/bumps, redness, skin tightness, ulcers and sensitivity to sunlight.  Allergic/Immunologic: Negative for  susceptible to infections.  Neurological: Negative for dizziness, light-headedness, numbness, headaches and memory loss.  Hematological: Negative for bruising/bleeding tendency and swollen glands.  Psychiatric/Behavioral: Negative for depressed mood, confusion and sleep disturbance. The patient is not nervous/anxious.     PMFS History:  Patient Active Problem List   Diagnosis Date Noted  . Psoriasis 04/03/2018  . Postablative hypothyroidism 01/30/2017  . Reactive arthritis ([place]) 11/01/2016  . Primary osteoarthritis of both knees 11/01/2016  . Primary osteoarthritis of both hands 11/01/2016  . Primary osteoarthritis of both feet 11/01/2016  . Leukocytosis   . HTN (hypertension) 05/24/2015  . Hyperlipidemia 05/24/2015  . C. difficile colitis 05/24/2015  . Hypokalemia 05/24/2015  . Hyperglycemia 05/24/2015  . Pain of right thigh 10/20/2014  . Other iron deficiency anemias 08/05/2014  . Palpitations 08/21/2013  . Personal history of endometriosis 02/04/2013  . Fibroid uterus 02/04/2013  . Varicose veins of lower extremities with other complications 23/76/2831  . Varicosities of leg 03/10/2012  . Multiple thyroid nodules 01/22/2012    Past Medical History:  Diagnosis Date  . Arrhythmia 2013   PVCs,-monitor  . Arthritis   . Colon polyps 2008   BENIGN  . Depression    PMDD  . Hyperlipidemia   . Kidney infection   . Osteopenia 08/2018   T score -1.5 FRAX 7.3% / 0.4%  . Palpitations   . Postablative hypothyroidism   . Raynaud's phenomenon   . Rotator cuff disorder    TEAR-    Family History  Problem Relation Age of Onset  . Hypertension Father   . Lung cancer Father   . Brain cancer Father   .  Hypertension Mother   . Heart attack Mother 54  . Osteoporosis Mother   . Obesity Brother   . Obesity Brother   . Heart attack Maternal Grandfather   . Migraines Daughter    Past Surgical History:  Procedure Laterality Date  . DOPPLER ECHOCARDIOGRAPHY  08/14/2008   EF =>  55% , no valvular pathology  . EVENT MONITOR  08/15/2012-08/28/2012   NSR with PVC's ,PSVT vs Aflutter WITH 2:1  . HAND SURGERY Left   . NM MYOCAR PERF WALL MOTION  09/04/2012   EF 66% ,LV normal, exercise [place] 11 mets -completeing stae 3 and 1 minute into stage 4,developed some mild chest pain and throat  tightness caused to stop exercise. [facility] TM scor  2 - MOD RISK  . OOPHORECTOMY  2010   LAP RSO  . right heel surgery Right 04/2018  . ROTATOR CUFF REPAIR    . THYROID LOBECTOMY     RIGHT   Social History   Social History Narrative  . Not on file   Immunization History  Administered Date(s) Administered  . Influenza,inj,Quad PF,6+ Mos 12/27/2016  . Tdap 01/23/2013     Objective: Vital Signs: BP 92/60 (BP Location: Left Arm, Patient Position: Sitting, Cuff Size: Normal)   Pulse 66   Resp 13   Ht 5\' 4"  (1.626 m)   Wt 137 lb 12.8 oz (62.5 kg)   LMP 03/22/2015   BMI 23.65 kg/m    Physical Exam Vitals signs and nursing note reviewed.  Constitutional:      Appearance: She is well-developed.  HENT:     Head: Normocephalic and atraumatic.  Eyes:     Conjunctiva/sclera: Conjunctivae normal.  Neck:     Musculoskeletal: Normal range of motion.  Cardiovascular:     Rate and Rhythm: Normal rate and regular rhythm.     Heart sounds: Normal heart sounds.  Pulmonary:     Effort: Pulmonary effort is normal.     Breath sounds: Normal breath sounds.  Abdominal:     General: Bowel sounds are normal.     Palpations: Abdomen is soft.  Lymphadenopathy:     Cervical: No cervical adenopathy.  Skin:    General: Skin is warm and dry.     Capillary Refill: Capillary refill takes less than 2 seconds.     Comments: No nailbed capillary changes.  No digital ulcerations or signs of gangrene.   Neurological:     Mental Status: She is alert and oriented to person, place, and time.  Psychiatric:        Behavior: Behavior normal.      Musculoskeletal Exam: C-spine, thoracic spine, and  lumbar spine good ROM.  No midline spinal tenderness.  No SI joint tenderness.  Shoulder joints, elbow joints, wrist joints, MCPs, PIPs, and DIPs good ROM with no synovitis.  PIP and DIP synovial thickening consistent with osteoarthritis.  Mucin cyst left index finger. Complete fist formation bilaterally.  Hip joints, knee joints, ankle joints, MTPs, PIPs, and DIPs good ROM with no synovitis.  No warmth or effusion of knee joints.  No tenderness or swelling of ankle joints.   CDAI Exam: CDAI Score: Not documented Patient Global Assessment: Not documented; Provider Global Assessment: Not documented Swollen: Not documented; Tender: Not documented Joint Exam   Not documented   There is currently no information documented on the homunculus. Go to the Rheumatology activity and complete the homunculus joint exam.  Investigation: No additional findings.  Imaging: No results found.  Recent  Labs: Lab Results  Component Value Date   WBC 5.4 06/17/2017   HGB 11.8 06/17/2017   PLT 299 06/17/2017   NA 140 06/17/2017   K 4.4 06/17/2017   CL 104 06/17/2017   CO2 29 06/17/2017   GLUCOSE 83 06/17/2017   BUN 16 06/17/2017   CREATININE 0.72 06/17/2017   BILITOT 0.4 06/17/2017   ALKPHOS 57 06/17/2017   AST 24 06/17/2017   ALT 24 06/17/2017   PROT 6.4 06/17/2017   ALBUMIN 3.9 06/17/2017   CALCIUM 9.0 06/17/2017   GFRAA >60 05/25/2015    Speciality Comments: No specialty comments available.  Procedures:  No procedures performed Allergies: Lomotil [diphenoxylate]   Assessment / Plan:     Visit Diagnoses: Primary osteoarthritis of both hands: She has PIP and DIP synovial thickening.  She has no synovitis or tenderness on exam.  She has complete fist formation bilaterally.  She has a mucin cyst on the left index finger.  She declined an aspiration today.  Joint protection and muscle strengthening were discussed.   Primary osteoarthritis of both knees: No warmth or effusion.  Good ROM with no  discomfort.  She has not been experiencing any knee joint pain.   Primary osteoarthritis of both feet: She has no discomfort in her feet at this time.    Trochanteric bursitis of left hip: She has no tenderness on exam today.   Psoriasis: Well controlled with topical agents.  Raynaud's disease without gangrene: She has not had any symptoms of Raynaud's recently.  She has no digital ulcerations or signs of gangrene.  She has no nailbed capillary changes. No skin tightness noted. She was advised to notify us if develops new or worsening symptoms.    Pruritus of palm: Resolved   Other medical conditions are listed as follows:   History of hypertension  History of hyperlipidemia  History of hypothyroidism  History of attention deficit disorder   Orders: No orders of the defined types were placed in this encounter.  No orders of the defined types were placed in this encounter.    Follow-Up Instructions: Return in about 1 year (around 01/10/2020) for Osteoarthritis, Raynaud's syndrome.   Ofilia Neas, PA-C  Note - This record has been created using Dragon software.  Chart creation errors have been sought, but may not always  have been located. Such creation errors do not reflect on  the standard of medical care.

## 2019-01-06 ENCOUNTER — Ambulatory Visit: Payer: BLUE CROSS/BLUE SHIELD | Admitting: Physician Assistant

## 2019-01-09 ENCOUNTER — Encounter: Payer: Self-pay | Admitting: Physician Assistant

## 2019-01-09 ENCOUNTER — Ambulatory Visit (INDEPENDENT_AMBULATORY_CARE_PROVIDER_SITE_OTHER): Payer: BLUE CROSS/BLUE SHIELD | Admitting: Physician Assistant

## 2019-01-09 VITALS — BP 92/60 | HR 66 | Resp 13 | Ht 64.0 in | Wt 137.8 lb

## 2019-01-09 DIAGNOSIS — M7062 Trochanteric bursitis, left hip: Secondary | ICD-10-CM | POA: Diagnosis not present

## 2019-01-09 DIAGNOSIS — L409 Psoriasis, unspecified: Secondary | ICD-10-CM

## 2019-01-09 DIAGNOSIS — Z8679 Personal history of other diseases of the circulatory system: Secondary | ICD-10-CM

## 2019-01-09 DIAGNOSIS — Z8659 Personal history of other mental and behavioral disorders: Secondary | ICD-10-CM

## 2019-01-09 DIAGNOSIS — M170 Bilateral primary osteoarthritis of knee: Secondary | ICD-10-CM

## 2019-01-09 DIAGNOSIS — M19041 Primary osteoarthritis, right hand: Secondary | ICD-10-CM

## 2019-01-09 DIAGNOSIS — I7300 Raynaud's syndrome without gangrene: Secondary | ICD-10-CM

## 2019-01-09 DIAGNOSIS — Z8639 Personal history of other endocrine, nutritional and metabolic disease: Secondary | ICD-10-CM

## 2019-01-09 DIAGNOSIS — M19071 Primary osteoarthritis, right ankle and foot: Secondary | ICD-10-CM | POA: Diagnosis not present

## 2019-01-09 DIAGNOSIS — M19072 Primary osteoarthritis, left ankle and foot: Secondary | ICD-10-CM

## 2019-01-09 DIAGNOSIS — M19042 Primary osteoarthritis, left hand: Secondary | ICD-10-CM

## 2019-01-09 DIAGNOSIS — L298 Other pruritus: Secondary | ICD-10-CM

## 2019-01-09 DIAGNOSIS — L2989 Other pruritus: Secondary | ICD-10-CM

## 2019-06-02 ENCOUNTER — Other Ambulatory Visit: Payer: Self-pay

## 2019-06-03 ENCOUNTER — Encounter: Payer: Self-pay | Admitting: Women's Health

## 2019-06-03 ENCOUNTER — Ambulatory Visit (INDEPENDENT_AMBULATORY_CARE_PROVIDER_SITE_OTHER): Payer: BC Managed Care – PPO | Admitting: Women's Health

## 2019-06-03 VITALS — BP 120/76

## 2019-06-03 DIAGNOSIS — N952 Postmenopausal atrophic vaginitis: Secondary | ICD-10-CM

## 2019-06-03 MED ORDER — ESTRING 2 MG VA RING: 2.0000 mg | VAGINAL_RING | VAGINAL | 4 refills | Status: DC

## 2019-06-03 NOTE — Progress Notes (Signed)
History: 63 year old MWF G2P2 presents with vaginal dryness and pain with intercourse for 3 weeks. Postmenopausal, was on HRT, but stopped 07/2018. Having rare hot flashes about once a month. Denies vaginal bleeding, discharge, itching, odor, or urinary sx's. HSV1 with rare outbreaks. Normal pap and mammogram history.  Medical history includes hypothyroidism and depression- managed by PCP.  Exam: Appears well.  External genitalia mild atrophy,  Speculum exam: Vaginal atrophy  with no erythema or discharge. Cervix within normal limits.  Atrophic Vaginitis  Postmenopausal on no HRT with no bleeding  Plan: Reviewed options for local estrogen and patient would like to proceed with Estring. Estring 2 mg vaginally every 3 months.  Continue vaginal lubricants with intercourse.  Recommended A &D cream for external irritation and dryness. Instructed to call if symptoms don't improve, will evaluate at annual exam in 2 months.Marland Kitchen

## 2019-06-03 NOTE — Patient Instructions (Signed)
Atrophic Vaginitis  Atrophic vaginitis is a condition in which the tissues that line the vagina become dry and thin. This condition is most common in women who have stopped having regular menstrual periods (are in menopause). This usually starts when a woman is 45-61 years old. That is the time when a woman's estrogen levels begin to drop (decrease). Estrogen is a female hormone. It helps to keep the tissues of the vagina moist. It stimulates the vagina to produce a clear fluid that lubricates the vagina for sexual intercourse. This fluid also protects the vagina from infection. Lack of estrogen can cause the lining of the vagina to get thinner and dryer. The vagina may also shrink in size. It may become less elastic. Atrophic vaginitis tends to get worse over time as a woman's estrogen level drops. What are the causes? This condition is caused by the normal drop in estrogen that happens around the time of menopause. What increases the risk? Certain conditions or situations may lower a woman's estrogen level, leading to a higher risk for atrophic vaginitis. You are more likely to develop this condition if:  You are taking medicines that block estrogen.  You have had your ovaries removed.  You are being treated for cancer with X-ray (radiation) or medicines (chemotherapy).  You have given birth or are breastfeeding.  You are older than age 50.  You smoke. What are the signs or symptoms? Symptoms of this condition include:  Pain, soreness, or bleeding during sexual intercourse (dyspareunia).  Vaginal burning, irritation, or itching.  Pain or bleeding when a speculum is used in a vaginal exam (pelvic exam).  Having burning pain when passing urine.  Vaginal discharge that is brown or yellow. In some cases, there are no symptoms. How is this diagnosed? This condition is diagnosed by taking a medical history and doing a physical exam. This will include a pelvic exam that checks the  vaginal tissues. Though rare, you may also have other tests, including:  A urine test.  A test that checks the acid balance in your vagina (acid balance test). How is this treated? Treatment for this condition depends on how severe your symptoms are. Treatment may include:  Using an over-the-counter vaginal lubricant before sex.  Using a long-acting vaginal moisturizer.  Using low-dose vaginal estrogen for moderate to severe symptoms that do not respond to other treatments. Options include creams, tablets, and inserts (vaginal rings). Before you use a vaginal estrogen, tell your health care provider if you have a history of: ? Breast cancer. ? Endometrial cancer. ? Blood clots. If you are not sexually active and your symptoms are very mild, you may not need treatment. Follow these instructions at home: Medicines  Take over-the-counter and prescription medicines only as told by your health care provider. Do not use herbal or alternative medicines unless your health care provider says that you can.  Use over-the-counter creams, lubricants, or moisturizers for dryness only as directed by your health care provider. General instructions  If your atrophic vaginitis is caused by menopause, discuss all of your menopause symptoms and treatment options with your health care provider.  Do not douche.  Do not use products that can make your vagina dry. These include: ? Scented feminine sprays. ? Scented tampons. ? Scented soaps.  Vaginal intercourse can help to improve blood flow and elasticity of vaginal tissue. If it hurts to have sex, try using a lubricant or moisturizer just before having intercourse. Contact a health care provider if:    Your discharge looks different than normal.  Your vagina has an unusual smell.  You have new symptoms.  Your symptoms do not improve with treatment.  Your symptoms get worse. Summary  Atrophic vaginitis is a condition in which the tissues that  line the vagina become dry and thin. It is most common in women who have stopped having regular menstrual periods (are in menopause).  Treatment options include using vaginal lubricants and low-dose vaginal estrogen.  Contact a health care provider if your vagina has an unusual smell, or if your symptoms get worse or do not improve after treatment. This information is not intended to replace advice given to you by your health care provider. Make sure you discuss any questions you have with your health care provider. Document Released: 03/29/2015 Document Revised: 10/25/2017 Document Reviewed: 08/08/2017 Elsevier Patient Education  2020 [employer]  

## 2019-06-05 ENCOUNTER — Telehealth: Payer: Self-pay | Admitting: *Deleted

## 2019-06-05 NOTE — Telephone Encounter (Signed)
PA done via cover my meds for estring 2 mg vaginal ring by [employer], will wait for response.

## 2019-06-08 MED ORDER — VAGIFEM 10 MCG VA TABS: 10.0000 ug | ORAL_TABLET | VAGINAL | 3 refills | Status: DC

## 2019-06-08 MED ORDER — VAGIFEM 10 MCG VA TABS: 10.0000 ug | ORAL_TABLET | Freq: Every day | VAGINAL | 0 refills | Status: DC

## 2019-06-08 NOTE — Telephone Encounter (Signed)
Brand Vagifem 1 applicator per vagina daily for 2 weeks and then twice weekly per vagina thereafter.  Please E scribed.  Thank you

## 2019-06-08 NOTE — Telephone Encounter (Signed)
[employer] declined prior authorization for estring 2 mg tablet, patient will need to try formulary medication such as premarin vaginal cream or brand vagifem.

## 2019-06-08 NOTE — Telephone Encounter (Signed)
Patient informed, Rx sent.  

## 2019-06-10 DIAGNOSIS — H5213 Myopia, bilateral: Secondary | ICD-10-CM | POA: Diagnosis not present

## 2019-06-18 ENCOUNTER — Other Ambulatory Visit: Payer: Self-pay

## 2019-08-07 ENCOUNTER — Other Ambulatory Visit: Payer: Self-pay

## 2019-08-10 ENCOUNTER — Other Ambulatory Visit: Payer: Self-pay

## 2019-08-10 ENCOUNTER — Ambulatory Visit (INDEPENDENT_AMBULATORY_CARE_PROVIDER_SITE_OTHER): Payer: BC Managed Care – PPO | Admitting: Women's Health

## 2019-08-10 ENCOUNTER — Encounter: Payer: Self-pay | Admitting: Women's Health

## 2019-08-10 VITALS — BP 124/80 | Ht 64.0 in | Wt 139.0 lb

## 2019-08-10 DIAGNOSIS — Z01419 Encounter for gynecological examination (general) (routine) without abnormal findings: Secondary | ICD-10-CM

## 2019-08-10 DIAGNOSIS — Z23 Encounter for immunization: Secondary | ICD-10-CM | POA: Diagnosis not present

## 2019-08-10 DIAGNOSIS — Z13220 Encounter for screening for lipoid disorders: Secondary | ICD-10-CM | POA: Diagnosis not present

## 2019-08-10 MED ORDER — ESTRADIOL 10 MCG VA TABS: 10.0000 ug | ORAL_TABLET | Freq: Every day | VAGINAL | 4 refills | Status: DC

## 2019-08-10 MED ORDER — SERTRALINE HCL 50 MG PO TABS: 75.0000 mg | ORAL_TABLET | Freq: Every day | ORAL | 4 refills | Status: DC

## 2019-08-10 MED ORDER — VALACYCLOVIR HCL 1 G PO TABS: 1000.0000 mg | ORAL_TABLET | Freq: Every day | ORAL | 3 refills | Status: DC

## 2019-08-10 NOTE — Patient Instructions (Addendum)
shingrex vaccine [name] [phone]  Health Maintenance for Postmenopausal Women Menopause is a normal process in which your ability to get pregnant comes to an end. This process happens slowly over many months or years, usually between the ages of 41 and 7. Menopause is complete when you have missed your menstrual periods for 12 months. It is important to talk with your health care provider about some of the most common conditions that affect women after menopause (postmenopausal women). These include heart disease, cancer, and bone loss (osteoporosis). Adopting a healthy lifestyle and getting preventive care can help to promote your health and wellness. The actions you take can also lower your chances of developing some of these common conditions. What should I know about menopause? During menopause, you may get a number of symptoms, such as:  Hot flashes. These can be moderate or severe.  Night sweats.  Decrease in sex drive.  Mood swings.  Headaches.  Tiredness.  Irritability.  Memory problems.  Insomnia. Choosing to treat or not to treat these symptoms is a decision that you make with your health care provider. Do I need hormone replacement therapy?  Hormone replacement therapy is effective in treating symptoms that are caused by menopause, such as hot flashes and night sweats.  Hormone replacement carries certain risks, especially as you become older. If you are thinking about using estrogen or estrogen with progestin, discuss the benefits and risks with your health care provider. What is my risk for heart disease and stroke? The risk of heart disease, heart attack, and stroke increases as you age. One of the causes may be a change in the body's hormones during menopause. This can affect how your body uses dietary fats, triglycerides, and cholesterol. Heart attack and stroke are medical emergencies. There are many things that you can do to help prevent heart disease and stroke.  Watch your blood pressure  High blood pressure causes heart disease and increases the risk of stroke. This is more likely to develop in people who have high blood pressure readings, are of African descent, or are overweight.  Have your blood pressure checked: ? Every 3-5 years if you are 47-54 years of age. ? Every year if you are 42 years old or older. Eat a healthy diet   Eat a diet that includes plenty of vegetables, fruits, low-fat dairy products, and lean protein.  Do not eat a lot of foods that are high in solid fats, added sugars, or sodium. Get regular exercise Get regular exercise. This is one of the most important things you can do for your health. Most adults should:  Try to exercise for at least 150 minutes each week. The exercise should increase your heart rate and make you sweat (moderate-intensity exercise).  Try to do strengthening exercises at least twice each week. Do these in addition to the moderate-intensity exercise.  Spend less time sitting. Even light physical activity can be beneficial. Other tips  Work with your health care provider to achieve or maintain a healthy weight.  Do not use any products that contain nicotine or tobacco, such as cigarettes, e-cigarettes, and chewing tobacco. If you need help quitting, ask your health care provider.  Know your numbers. Ask your health care provider to check your cholesterol and your blood sugar (glucose). Continue to have your blood tested as directed by your health care provider. Do I need screening for cancer? Depending on your health history and family history, you may need to have cancer screening at different  stages of your life. This may include screening for:  Breast cancer.  Cervical cancer.  Lung cancer.  Colorectal cancer. What is my risk for osteoporosis? After menopause, you may be at increased risk for osteoporosis. Osteoporosis is a condition in which bone destruction happens more quickly than  new bone creation. To help prevent osteoporosis or the bone fractures that can happen because of osteoporosis, you may take the following actions:  If you are 52-32 years old, get at least 1,000 mg of calcium and at least 600 mg of vitamin D per day.  If you are older than age 72 but younger than age 67, get at least 1,200 mg of calcium and at least 600 mg of vitamin D per day.  If you are older than age 37, get at least 1,200 mg of calcium and at least 800 mg of vitamin D per day. Smoking and drinking excessive alcohol increase the risk of osteoporosis. Eat foods that are rich in calcium and vitamin D, and do weight-bearing exercises several times each week as directed by your health care provider. How does menopause affect my mental health? Depression may occur at any age, but it is more common as you become older. Common symptoms of depression include:  Low or sad mood.  Changes in sleep patterns.  Changes in appetite or eating patterns.  Feeling an overall lack of motivation or enjoyment of activities that you previously enjoyed.  Frequent crying spells. Talk with your health care provider if you think that you are experiencing depression. General instructions See your health care provider for regular wellness exams and vaccines. This may include:  Scheduling regular health, dental, and eye exams.  Getting and maintaining your vaccines. These include: ? Influenza vaccine. Get this vaccine each year before the flu season begins. ? Pneumonia vaccine. ? Shingles vaccine. ? Tetanus, diphtheria, and pertussis (Tdap) booster vaccine. Your health care provider may also recommend other immunizations. Tell your health care provider if you have ever been abused or do not feel safe at home. Summary  Menopause is a normal process in which your ability to get pregnant comes to an end.  This condition causes hot flashes, night sweats, decreased interest in sex, mood swings, headaches, or  lack of sleep.  Treatment for this condition may include hormone replacement therapy.  Take actions to keep yourself healthy, including exercising regularly, eating a healthy diet, watching your weight, and checking your blood pressure and blood sugar levels.  Get screened for cancer and depression. Make sure that you are up to date with all your vaccines. This information is not intended to replace advice given to you by your health care provider. Make sure you discuss any questions you have with your health care provider. Document Released: 01/04/2006 Document Revised: 11/05/2018 Document Reviewed: 11/05/2018 Elsevier Patient Education  2020 [employer].
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
## 2019-08-11 ENCOUNTER — Telehealth: Payer: Self-pay

## 2019-08-11 MED ORDER — ESTRADIOL 10 MCG VA TABS: ORAL_TABLET | VAGINAL | 4 refills | Status: DC

## 2019-08-11 NOTE — Telephone Encounter (Signed)
Prescription received with 2 sets of instructions. Pharmacy needs clarification. Rx corrected per office note and resent.

## 2019-08-13 ENCOUNTER — Other Ambulatory Visit: Payer: Self-pay

## 2019-08-13 ENCOUNTER — Other Ambulatory Visit: Payer: BC Managed Care – PPO

## 2019-08-13 DIAGNOSIS — Z13220 Encounter for screening for lipoid disorders: Secondary | ICD-10-CM | POA: Diagnosis not present

## 2019-08-13 DIAGNOSIS — Z01419 Encounter for gynecological examination (general) (routine) without abnormal findings: Secondary | ICD-10-CM

## 2019-08-13 LAB — LIPID PANEL
Cholesterol: 224 mg/dL — ABNORMAL HIGH (ref <200)
HDL: 68 mg/dL (ref >=50)
LDL Cholesterol (Calc): 138 mg/dL (calc) — ABNORMAL HIGH
Non-HDL Cholesterol (Calc): 156 mg/dL (calc) — ABNORMAL HIGH (ref <130)
Total CHOL/HDL Ratio: 3.3 (calc) (ref <5.0)
Triglycerides: 83 mg/dL (ref <150)

## 2019-08-13 LAB — COMPREHENSIVE METABOLIC PANEL
AG Ratio: 1.8 (calc) (ref 1.0–2.5)
ALT: 17 U/L (ref 6–29)
AST: 19 U/L (ref 10–35)
Albumin: 4.2 g/dL (ref 3.6–5.1)
Alkaline phosphatase (APISO): 70 U/L (ref 37–153)
BUN: 11 mg/dL (ref 7–25)
CO2: 29 mmol/L (ref 20–32)
Calcium: 9.4 mg/dL (ref 8.6–10.4)
Chloride: 102 mmol/L (ref 98–110)
Creat: 0.77 mg/dL (ref 0.50–0.99)
Globulin: 2.4 g/dL (calc) (ref 1.9–3.7)
Glucose, Bld: 97 mg/dL (ref 65–99)
Potassium: 4.2 mmol/L (ref 3.5–5.3)
Sodium: 137 mmol/L (ref 135–146)
Total Bilirubin: 0.7 mg/dL (ref 0.2–1.2)
Total Protein: 6.6 g/dL (ref 6.1–8.1)

## 2019-08-13 LAB — CBC WITH DIFFERENTIAL/PLATELET
Absolute Monocytes: 361 cells/uL (ref 200–950)
Basophils Absolute: 39 cells/uL (ref 0–200)
Basophils Relative: 0.9 %
Eosinophils Absolute: 120 cells/uL (ref 15–500)
Eosinophils Relative: 2.8 %
HCT: 36.7 % (ref 35.0–45.0)
Hemoglobin: 11.8 g/dL (ref 11.7–15.5)
Lymphs Abs: 1509 cells/uL (ref 850–3900)
MCH: 28.3 pg (ref 27.0–33.0)
MCHC: 32.2 g/dL (ref 32.0–36.0)
MCV: 88 fL (ref 80.0–100.0)
MPV: 11.1 fL (ref 7.5–12.5)
Monocytes Relative: 8.4 %
Neutro Abs: 2270 cells/uL (ref 1500–7800)
Neutrophils Relative %: 52.8 %
Platelets: 267 10*3/uL (ref 140–400)
RBC: 4.17 10*6/uL (ref 3.80–5.10)
RDW: 12.6 % (ref 11.0–15.0)
Total Lymphocyte: 35.1 %
WBC: 4.3 10*3/uL (ref 3.8–10.8)

## 2019-08-25 ENCOUNTER — Encounter: Payer: Self-pay | Admitting: Gynecology

## 2019-09-11 DIAGNOSIS — Z1231 Encounter for screening mammogram for malignant neoplasm of breast: Secondary | ICD-10-CM | POA: Diagnosis not present

## 2019-09-22 DIAGNOSIS — Z20828 Contact with and (suspected) exposure to other viral communicable diseases: Secondary | ICD-10-CM | POA: Diagnosis not present

## 2019-10-02 ENCOUNTER — Other Ambulatory Visit: Payer: Self-pay

## 2019-10-06 ENCOUNTER — Ambulatory Visit (INDEPENDENT_AMBULATORY_CARE_PROVIDER_SITE_OTHER): Payer: BC Managed Care – PPO | Admitting: Internal Medicine

## 2019-10-06 ENCOUNTER — Encounter: Payer: Self-pay | Admitting: Internal Medicine

## 2019-10-06 VITALS — BP 120/60 | HR 64 | Ht 64.0 in | Wt 140.0 lb

## 2019-10-06 DIAGNOSIS — E042 Nontoxic multinodular goiter: Secondary | ICD-10-CM

## 2019-10-06 DIAGNOSIS — E890 Postprocedural hypothyroidism: Secondary | ICD-10-CM

## 2019-10-06 LAB — T3, FREE: T3, Free: 3.6 pg/mL (ref 2.3–4.2)

## 2019-10-06 LAB — TSH: TSH: 2.25 u[IU]/mL (ref 0.35–4.50)

## 2019-10-06 LAB — T4, FREE: Free T4: 0.6 ng/dL (ref 0.60–1.60)

## 2019-10-06 MED ORDER — ARMOUR THYROID 60 MG PO TABS: ORAL_TABLET | ORAL | 3 refills | Status: DC

## 2019-10-06 MED ORDER — ARMOUR THYROID 90 MG PO TABS: ORAL_TABLET | ORAL | 3 refills | Status: DC

## 2019-10-06 NOTE — Patient Instructions (Signed)
Please continue: °- Armour 90 mg 1/7 days and 60 mg 6/7 days ° °Take the thyroid hormone every day, with water, at least 30 minutes before breakfast, separated by at least 4 hours from: °- acid reflux medications °- calcium °- iron °- multivitamins ° °Please stop at the lab. ° °Please return 1 year for an appt. ° °
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
## 2019-10-16 ENCOUNTER — Other Ambulatory Visit: Payer: Self-pay

## 2019-10-16 ENCOUNTER — Encounter: Payer: Self-pay | Admitting: Gynecology

## 2019-10-16 ENCOUNTER — Ambulatory Visit (INDEPENDENT_AMBULATORY_CARE_PROVIDER_SITE_OTHER): Payer: BC Managed Care – PPO | Admitting: Gynecology

## 2019-10-16 VITALS — BP 112/64

## 2019-10-16 DIAGNOSIS — N3001 Acute cystitis with hematuria: Secondary | ICD-10-CM

## 2019-10-16 DIAGNOSIS — R300 Dysuria: Secondary | ICD-10-CM

## 2019-10-16 MED ORDER — SULFAMETHOXAZOLE-TRIMETHOPRIM 800-160 MG PO TABS: 1.0000 | ORAL_TABLET | Freq: Two times a day (BID) | ORAL | 0 refills | Status: DC

## 2019-10-16 NOTE — Patient Instructions (Signed)
Take the antibiotic twice daily for 3 days.  Follow-up if your symptoms persist, worsen or recur.

## 2019-10-16 NOTE — Progress Notes (Signed)
    [name] [birth year] [id number]        63 y.o.  G2P2002 presents with 2 days of mild dysuria, frequency and noted hematuria this morning.  No fever or chills.  No low back pain.  No vaginal symptoms.  Past medical history,surgical history, problem list, medications, allergies, family history and social history were all reviewed and documented in the EPIC chart.  Directed ROS with pertinent positives and negatives documented in the history of present illness/assessment and plan.  Exam: Vitals:   10/16/19 1104  BP: 112/64   General appearance:  Normal Spine straight without CVA tenderness Abdomen soft nontender without masses guarding rebound  Assessment/Plan:  63 y.o. [id number] with history and UA consistent with UTI.  Will treat with Septra DS 1 p.o. twice daily x3 days.  Follow-up if symptoms persist, worsen or recur.    Anastasio Auerbach MD, 11:34 AM 10/16/2019

## 2019-10-19 LAB — URINE CULTURE
MICRO NUMBER:: 1123837
SPECIMEN QUALITY:: ADEQUATE

## 2019-10-19 LAB — URINALYSIS, COMPLETE W/RFL CULTURE
Bilirubin Urine: NEGATIVE
Glucose, UA: NEGATIVE
Hyaline Cast: NONE SEEN /LPF
Ketones, ur: NEGATIVE
Nitrites, Initial: NEGATIVE
Protein, ur: NEGATIVE
Specific Gravity, Urine: 1.015 (ref 1.001–1.03)
pH: 7 (ref 5.0–8.0)

## 2019-10-19 LAB — CULTURE INDICATED

## 2019-10-21 ENCOUNTER — Telehealth: Payer: Self-pay | Admitting: *Deleted

## 2019-10-21 MED ORDER — NITROFURANTOIN MONOHYD MACRO 100 MG PO CAPS: 100.0000 mg | ORAL_CAPSULE | Freq: Two times a day (BID) | ORAL | 0 refills | Status: DC

## 2019-10-21 NOTE — Telephone Encounter (Signed)
Please call and review urine culture was positive and Bactrim is susceptible to the bacteria noted.  If symptoms are no better Macrobid twice daily for 7 days take with food.

## 2019-10-21 NOTE — Telephone Encounter (Signed)
Patient was seen and treated for UTI on 10/16/19 bactrim BID x 3 days, patient called today still having back pain, pressure, and burning. Please advise

## 2019-10-21 NOTE — Telephone Encounter (Signed)
Patient informed. 

## 2019-10-28 ENCOUNTER — Other Ambulatory Visit: Payer: Self-pay | Admitting: Oral and Maxillofacial Surgery

## 2019-10-28 DIAGNOSIS — D230 Other benign neoplasm of skin of lip: Secondary | ICD-10-CM | POA: Diagnosis not present

## 2019-10-28 DIAGNOSIS — D224 Melanocytic nevi of scalp and neck: Secondary | ICD-10-CM | POA: Diagnosis not present

## 2019-10-28 DIAGNOSIS — D220 Melanocytic nevi of lip: Secondary | ICD-10-CM | POA: Diagnosis not present

## 2019-11-10 ENCOUNTER — Other Ambulatory Visit: Payer: Self-pay

## 2019-11-10 MED ORDER — ARMOUR THYROID 60 MG PO TABS: ORAL_TABLET | ORAL | 3 refills | Status: DC

## 2020-01-12 ENCOUNTER — Ambulatory Visit: Payer: Self-pay | Admitting: Rheumatology

## 2020-02-10 NOTE — Progress Notes (Deleted)
Office Visit Note  Patient: [name]             Date of Birth: 17-Apr-1956           MRN: [id number]             PCP: Huel Cote, NP Referring: Huel Cote, NP Visit Date: 02/18/2020 Occupation: @GUAROCC @  Subjective:  No chief complaint on file.   History of Present Illness: [name] is a 64 y.o. female ***   Activities of Daily Living:  Patient reports morning stiffness for *** {minute/hour:19697}.   Patient {ACTIONS;DENIES/REPORTS:21021675::"Denies"} nocturnal pain.  Difficulty dressing/grooming: {ACTIONS;DENIES/REPORTS:21021675::"Denies"} Difficulty climbing stairs: {ACTIONS;DENIES/REPORTS:21021675::"Denies"} Difficulty getting out of chair: {ACTIONS;DENIES/REPORTS:21021675::"Denies"} Difficulty using hands for taps, buttons, cutlery, and/or writing: {ACTIONS;DENIES/REPORTS:21021675::"Denies"}  No Rheumatology ROS completed.   PMFS History:  Patient Active Problem List   Diagnosis Date Noted  . Psoriasis 04/03/2018  . Postablative hypothyroidism 01/30/2017  . Reactive arthritis ([place]) 11/01/2016  . Primary osteoarthritis of both knees 11/01/2016  . Primary osteoarthritis of both hands 11/01/2016  . Primary osteoarthritis of both feet 11/01/2016  . Leukocytosis   . Hyperlipidemia 05/24/2015  . C. difficile colitis 05/24/2015  . Hypokalemia 05/24/2015  . Hyperglycemia 05/24/2015  . Pain of right thigh 10/20/2014  . Other iron deficiency anemias 08/05/2014  . Palpitations 08/21/2013  . Personal history of endometriosis 02/04/2013  . Fibroid uterus 02/04/2013  . Varicose veins of lower extremities with other complications 99991111  . Varicosities of leg 03/10/2012  . Multiple thyroid nodules 01/22/2012    Past Medical History:  Diagnosis Date  . Arrhythmia 2013   PVCs,-monitor  . Arthritis   . Colon polyps 2008   BENIGN  . Depression    PMDD  . Hyperlipidemia   . Kidney infection   . Osteopenia 08/2018   T score -1.5 FRAX 7.3% /  0.4%  . Palpitations   . Postablative hypothyroidism   . Raynaud's phenomenon   . Rotator cuff disorder    TEAR-    Family History  Problem Relation Age of Onset  . Hypertension Father   . Lung cancer Father   . Brain cancer Father   . Hypertension Mother   . Heart attack Mother 74  . Osteoporosis Mother   . Obesity Brother   . Obesity Brother   . Heart attack Maternal Grandfather   . Migraines Daughter    Past Surgical History:  Procedure Laterality Date  . DOPPLER ECHOCARDIOGRAPHY  08/14/2008   EF => 55% , no valvular pathology  . EVENT MONITOR  08/15/2012-08/28/2012   NSR with PVC's ,PSVT vs Aflutter WITH 2:1  . HAND SURGERY Left   . NM MYOCAR PERF WALL MOTION  09/04/2012   EF 66% ,LV normal, exercise capcity 11 mets -completeing stae 3 and 1 minute into stage 4,developed some mild chest pain and throat  tightness caused to stop exercise. [facility] TM scor  2 - MOD RISK  . OOPHORECTOMY  2010   LAP RSO  . right heel surgery Right 04/2018  . ROTATOR CUFF REPAIR    . THYROID LOBECTOMY     RIGHT   Social History   Social History Narrative  . Not on file   Immunization History  Administered Date(s) Administered  . Influenza,inj,Quad PF,6+ Mos 12/27/2016, 08/10/2019  . Tdap 01/23/2013     Objective: Vital Signs: LMP 03/22/2015    Physical Exam   Musculoskeletal Exam: ***  CDAI Exam: CDAI Score: -- Patient Global: --; Provider Global: --  Swollen: --; Tender: -- Joint Exam 02/18/2020   No joint exam has been documented for this visit   There is currently no information documented on the homunculus. Go to the Rheumatology activity and complete the homunculus joint exam.  Investigation: No additional findings.  Imaging: No results found.  Recent Labs: Lab Results  Component Value Date   WBC 4.3 08/13/2019   HGB 11.8 08/13/2019   PLT 267 08/13/2019   NA 137 08/13/2019   K 4.2 08/13/2019   CL 102 08/13/2019   CO2 29 08/13/2019   GLUCOSE 97 08/13/2019    BUN 11 08/13/2019   CREATININE 0.77 08/13/2019   BILITOT 0.7 08/13/2019   ALKPHOS 57 06/17/2017   AST 19 08/13/2019   ALT 17 08/13/2019   PROT 6.6 08/13/2019   ALBUMIN 3.9 06/17/2017   CALCIUM 9.4 08/13/2019   GFRAA >60 05/25/2015    Speciality Comments: No specialty comments available.  Procedures:  No procedures performed Allergies: Lomotil [diphenoxylate]   Assessment / Plan:     Visit Diagnoses: No diagnosis found.  Orders: No orders of the defined types were placed in this encounter.  No orders of the defined types were placed in this encounter.   Face-to-face time spent with patient was *** minutes. Greater than 50% of time was spent in counseling and coordination of care.  Follow-Up Instructions: No follow-ups on file.   Earnestine Mealing, CMA  Note - This record has been created using Editor, commissioning.  Chart creation errors have been sought, but may not always  have been located. Such creation errors do not reflect on  the standard of medical care.

## 2020-02-18 ENCOUNTER — Ambulatory Visit: Payer: BC Managed Care – PPO | Admitting: Rheumatology

## 2020-03-15 NOTE — Progress Notes (Deleted)
Office Visit Note  Patient: [name]             Date of Birth: [birth year]           MRN: [id number]             PCP: Huel Cote, NP Referring: Huel Cote, NP Visit Date: 03/22/2020 Occupation: @GUAROCC @  Subjective:  No chief complaint on file.   History of Present Illness: [name] is a 64 y.o. female ***   Activities of Daily Living:  Patient reports morning stiffness for *** {minute/hour:19697}.   Patient {ACTIONS;DENIES/REPORTS:21021675::"Denies"} nocturnal pain.  Difficulty dressing/grooming: {ACTIONS;DENIES/REPORTS:21021675::"Denies"} Difficulty climbing stairs: {ACTIONS;DENIES/REPORTS:21021675::"Denies"} Difficulty getting out of chair: {ACTIONS;DENIES/REPORTS:21021675::"Denies"} Difficulty using hands for taps, buttons, cutlery, and/or writing: {ACTIONS;DENIES/REPORTS:21021675::"Denies"}  No Rheumatology ROS completed.   PMFS History:  Patient Active Problem List   Diagnosis Date Noted  . Psoriasis 04/03/2018  . Postablative hypothyroidism 01/30/2017  . Reactive arthritis ([place]) 11/01/2016  . Primary osteoarthritis of both knees 11/01/2016  . Primary osteoarthritis of both hands 11/01/2016  . Primary osteoarthritis of both feet 11/01/2016  . Leukocytosis   . Hyperlipidemia 05/24/2015  . C. difficile colitis 05/24/2015  . Hypokalemia 05/24/2015  . Hyperglycemia 05/24/2015  . Pain of right thigh 10/20/2014  . Other iron deficiency anemias 08/05/2014  . Palpitations 08/21/2013  . Personal history of endometriosis 02/04/2013  . Fibroid uterus 02/04/2013  . Varicose veins of lower extremities with other complications 99991111  . Varicosities of leg 03/10/2012  . Multiple thyroid nodules 01/22/2012    Past Medical History:  Diagnosis Date  . Arrhythmia 2013   PVCs,-monitor  . Arthritis   . Colon polyps 2008   BENIGN  . Depression    PMDD  . Hyperlipidemia   . Kidney infection   . Osteopenia 08/2018   T score -1.5 FRAX 7.3% /  0.4%  . Palpitations   . Postablative hypothyroidism   . Raynaud's phenomenon   . Rotator cuff disorder    TEAR-    Family History  Problem Relation Age of Onset  . Hypertension Father   . Lung cancer Father   . Brain cancer Father   . Hypertension Mother   . Heart attack Mother 19  . Osteoporosis Mother   . Obesity Brother   . Obesity Brother   . Heart attack Maternal Grandfather   . Migraines Daughter    Past Surgical History:  Procedure Laterality Date  . DOPPLER ECHOCARDIOGRAPHY  08/14/2008   EF => 55% , no valvular pathology  . EVENT MONITOR  08/15/2012-08/28/2012   NSR with PVC's ,PSVT vs Aflutter WITH 2:1  . HAND SURGERY Left   . NM MYOCAR PERF WALL MOTION  09/04/2012   EF 66% ,LV normal, exercise capcity 11 mets -completeing stae 3 and 1 minute into stage 4,developed some mild chest pain and throat  tightness caused to stop exercise. [facility] TM scor  2 - MOD RISK  . OOPHORECTOMY  2010   LAP RSO  . right heel surgery Right 04/2018  . ROTATOR CUFF REPAIR    . THYROID LOBECTOMY     RIGHT   Social History   Social History Narrative  . Not on file   Immunization History  Administered Date(s) Administered  . Influenza,inj,Quad PF,6+ Mos 12/27/2016, 08/10/2019  . Tdap 01/23/2013     Objective: Vital Signs: LMP 03/22/2015    Physical Exam   Musculoskeletal Exam: ***  CDAI Exam: CDAI Score: -- Patient Global: --; Provider Global: --  Swollen: --; Tender: -- Joint Exam 03/22/2020   No joint exam has been documented for this visit   There is currently no information documented on the homunculus. Go to the Rheumatology activity and complete the homunculus joint exam.  Investigation: No additional findings.  Imaging: No results found.  Recent Labs: Lab Results  Component Value Date   WBC 4.3 08/13/2019   HGB 11.8 08/13/2019   PLT 267 08/13/2019   NA 137 08/13/2019   K 4.2 08/13/2019   CL 102 08/13/2019   CO2 29 08/13/2019   GLUCOSE 97 08/13/2019    BUN 11 08/13/2019   CREATININE 0.77 08/13/2019   BILITOT 0.7 08/13/2019   ALKPHOS 57 06/17/2017   AST 19 08/13/2019   ALT 17 08/13/2019   PROT 6.6 08/13/2019   ALBUMIN 3.9 06/17/2017   CALCIUM 9.4 08/13/2019   GFRAA >60 05/25/2015    Speciality Comments: No specialty comments available.  Procedures:  No procedures performed Allergies: Lomotil [diphenoxylate]   Assessment / Plan:     Visit Diagnoses: No diagnosis found.  Orders: No orders of the defined types were placed in this encounter.  No orders of the defined types were placed in this encounter.   Face-to-face time spent with patient was *** minutes. Greater than 50% of time was spent in counseling and coordination of care.  Follow-Up Instructions: No follow-ups on file.   Ofilia Neas, PA-C  Note - This record has been created using Dragon software.  Chart creation errors have been sought, but may not always  have been located. Such creation errors do not reflect on  the standard of medical care.

## 2020-03-22 ENCOUNTER — Ambulatory Visit: Payer: BC Managed Care – PPO | Admitting: Rheumatology

## 2020-03-22 DIAGNOSIS — I7300 Raynaud's syndrome without gangrene: Secondary | ICD-10-CM

## 2020-03-22 DIAGNOSIS — M7062 Trochanteric bursitis, left hip: Secondary | ICD-10-CM

## 2020-03-22 DIAGNOSIS — Z8679 Personal history of other diseases of the circulatory system: Secondary | ICD-10-CM

## 2020-03-22 DIAGNOSIS — L409 Psoriasis, unspecified: Secondary | ICD-10-CM

## 2020-03-22 DIAGNOSIS — Z8659 Personal history of other mental and behavioral disorders: Secondary | ICD-10-CM

## 2020-03-22 DIAGNOSIS — M170 Bilateral primary osteoarthritis of knee: Secondary | ICD-10-CM

## 2020-03-22 DIAGNOSIS — M19071 Primary osteoarthritis, right ankle and foot: Secondary | ICD-10-CM

## 2020-03-22 DIAGNOSIS — M19041 Primary osteoarthritis, right hand: Secondary | ICD-10-CM

## 2020-03-22 DIAGNOSIS — Z8639 Personal history of other endocrine, nutritional and metabolic disease: Secondary | ICD-10-CM

## 2020-03-31 NOTE — Progress Notes (Signed)
Office Visit Note  Patient: [name]             Date of Birth: [birth year]           MRN: [id number]             PCP: Huel Cote, NP Referring: Huel Cote, NP Visit Date: 04/11/2020 Occupation: @GUAROCC @  Subjective:  Mucin cyst on left index finger  History of Present Illness: [name] is a 64 y.o. female with history of osteoarthritis.  Patient reports that the mucinous cyst on her left index finger has progressively been growing in size.  She states it has started to distort her nailbed and would like to have the cyst aspirated today.  She denies any increased pain or inflammation in her hands recently.  She states that the trochanter bursitis of both hips has resolved.  She is having some discomfort and mild inflammation in the right ankle joint but she states she previously fractured.  She states that she was recently at the beach and was walking more than normal which she feels is exacerbated her symptoms. She denies any symptoms of Raynaud's recently. She denies any digital ulcerations or signs of gangrene.  She denies any new concerns at this time.   Activities of Daily Living:  Patient reports morning stiffness for 0 minutes.   Patient Denies nocturnal pain.  Difficulty dressing/grooming: Denies Difficulty climbing stairs: Denies Difficulty getting out of chair: Denies Difficulty using hands for taps, buttons, cutlery, and/or writing: Denies  Review of Systems  Constitutional: Negative for fatigue.  HENT: Negative for mouth sores, mouth dryness and nose dryness.   Eyes: Negative for pain, itching, visual disturbance and dryness.  Respiratory: Negative for cough, hemoptysis, shortness of breath and difficulty breathing.   Cardiovascular: Negative for chest pain, palpitations, hypertension and swelling in legs/feet.  Gastrointestinal: Negative for blood in stool, constipation and diarrhea.  Endocrine: Negative for increased urination.  Genitourinary:  Negative for difficulty urinating and painful urination.  Musculoskeletal: Negative for arthralgias, joint pain, joint swelling, myalgias, muscle weakness, morning stiffness, muscle tenderness and myalgias.  Skin: Negative for color change, pallor, rash, hair loss, nodules/bumps, redness, skin tightness, ulcers and sensitivity to sunlight.  Allergic/Immunologic: Negative for susceptible to infections.  Neurological: Negative for dizziness, numbness, headaches, memory loss and weakness.  Hematological: Negative for bruising/bleeding tendency and swollen glands.  Psychiatric/Behavioral: Negative for depressed mood, confusion and sleep disturbance. The patient is not nervous/anxious.     PMFS History:  Patient Active Problem List   Diagnosis Date Noted  . Psoriasis 04/03/2018  . Postablative hypothyroidism 01/30/2017  . Reactive arthritis ([place]) 11/01/2016  . Primary osteoarthritis of both knees 11/01/2016  . Primary osteoarthritis of both hands 11/01/2016  . Primary osteoarthritis of both feet 11/01/2016  . Leukocytosis   . Hyperlipidemia 05/24/2015  . C. difficile colitis 05/24/2015  . Hypokalemia 05/24/2015  . Hyperglycemia 05/24/2015  . Pain of right thigh 10/20/2014  . Other iron deficiency anemias 08/05/2014  . Palpitations 08/21/2013  . Personal history of endometriosis 02/04/2013  . Fibroid uterus 02/04/2013  . Varicose veins of lower extremities with other complications 99991111  . Varicosities of leg 03/10/2012  . Multiple thyroid nodules 01/22/2012    Past Medical History:  Diagnosis Date  . Arrhythmia 2013   PVCs,-monitor  . Arthritis   . Colon polyps 2008   BENIGN  . Depression    PMDD  . Hyperlipidemia   . Kidney infection   .  Osteopenia 08/2018   T score -1.5 FRAX 7.3% / 0.4%  . Palpitations   . Postablative hypothyroidism   . Raynaud's phenomenon   . Rotator cuff disorder    TEAR-    Family History  Problem Relation Age of Onset  . Hypertension  Father   . Lung cancer Father   . Brain cancer Father   . Hypertension Mother   . Heart attack Mother 9  . Osteoporosis Mother   . Obesity Brother   . Obesity Brother   . Heart attack Maternal Grandfather   . Migraines Daughter    Past Surgical History:  Procedure Laterality Date  . DOPPLER ECHOCARDIOGRAPHY  08/14/2008   EF => 55% , no valvular pathology  . EVENT MONITOR  08/15/2012-08/28/2012   NSR with PVC's ,PSVT vs Aflutter WITH 2:1  . HAND SURGERY Left   . NM MYOCAR PERF WALL MOTION  09/04/2012   EF 66% ,LV normal, exercise capcity 11 mets -completeing stae 3 and 1 minute into stage 4,developed some mild chest pain and throat  tightness caused to stop exercise. [facility] TM scor  2 - MOD RISK  . OOPHORECTOMY  2010   LAP RSO  . right heel surgery Right 04/2018  . ROTATOR CUFF REPAIR    . THYROID LOBECTOMY     RIGHT   Social History   Social History Narrative  . Not on file   Immunization History  Administered Date(s) Administered  . Influenza,inj,Quad PF,6+ Mos 12/27/2016, 08/10/2019  . Tdap 01/23/2013     Objective: Vital Signs: BP 112/68 (BP Location: Left Arm, Patient Position: Sitting, Cuff Size: Normal)   Pulse (!) 53   Resp 13   Ht 5\' 4"  (1.626 m)   Wt 145 lb 6.4 oz (66 kg)   LMP 03/22/2015   BMI 24.96 kg/m    Physical Exam Vitals and nursing note reviewed.  Constitutional:      Appearance: She is well-developed.  HENT:     Head: Normocephalic and atraumatic.  Eyes:     Conjunctiva/sclera: Conjunctivae normal.  Pulmonary:     Effort: Pulmonary effort is normal.  Abdominal:     General: Bowel sounds are normal.     Palpations: Abdomen is soft.  Musculoskeletal:     Cervical back: Normal range of motion.  Lymphadenopathy:     Cervical: No cervical adenopathy.  Skin:    General: Skin is warm and dry.     Capillary Refill: Capillary refill takes less than 2 seconds.  Neurological:     Mental Status: She is alert and oriented to person, place, and  time.  Psychiatric:        Behavior: Behavior normal.      Musculoskeletal Exam: C-spine, thoracic spine, and lumbar spine good ROM.  Shoulder joints, elbow joints, wrist joints, MCPs, PIPs, and DIPs good ROM with no synovitis.  Mucin cyst on the left index.  PIP and DIP thickening consistent with osteoarthritis of both hands.  Hip joints, knees joints, ankle joints, MTPs, PIPs, and DIPs good ROM with no synovitis.  No warmth or effusion of knee joints.  No tenderness or swelling of ankle joints.    CDAI Exam: CDAI Score: -- Patient Global: --; Provider Global: -- Swollen: --; Tender: -- Joint Exam 04/11/2020   No joint exam has been documented for this visit   There is currently no information documented on the homunculus. Go to the Rheumatology activity and complete the homunculus joint exam.  Investigation: No additional findings.  Imaging: No results found.  Recent Labs: Lab Results  Component Value Date   WBC 4.3 08/13/2019   HGB 11.8 08/13/2019   PLT 267 08/13/2019   NA 137 08/13/2019   K 4.2 08/13/2019   CL 102 08/13/2019   CO2 29 08/13/2019   GLUCOSE 97 08/13/2019   BUN 11 08/13/2019   CREATININE 0.77 08/13/2019   BILITOT 0.7 08/13/2019   ALKPHOS 57 06/17/2017   AST 19 08/13/2019   ALT 17 08/13/2019   PROT 6.6 08/13/2019   ALBUMIN 3.9 06/17/2017   CALCIUM 9.4 08/13/2019   GFRAA >60 05/25/2015    Speciality Comments: No specialty comments available.  Procedures:  Incision & Drainage  Date/Time: 04/11/2020 12:37 PM Performed by: Bo Merino, MD Authorized by: Bo Merino, MD  Type: cyst Body area: upper extremity Location details: left index finger Anesthesia: local infiltration Needle gauge: 18 Incision type: elliptical Incision depth: dermal Complexity: simple Drainage: bloody Drainage amount: scant Wound treatment: wound left open Packing material: none Patient tolerance: patient tolerated the procedure well with no immediate  complications    Allergies: Lomotil [diphenoxylate]   Assessment / Plan:     Visit Diagnoses: Primary osteoarthritis of both hands: She has PIP and DIP thickening consistent with osteoarthritis of both hands.  She has no tenderness or inflammation on exam.  She has complete fist formation bilaterally.  She has a mucin cyst present on the left index finger and requested an aspiration today.  We discussed the importance of joint protection and muscle strengthening.  She is advised to notify us if she develops increased joint pain or joint swelling.  To follow-up in the office in 6 months.  Digital mucinous cyst of finger of left hand: Left first DIP.  The cyst has started to compress the nailbed and distort the fingernail.  She requested an aspiration today.  After informed consent was obtained using sterile technique using a 18-gauge needle the mucinous cyst was attempted to drain.  She has only bloody discharge and mucinous cyst could not be drained.  Aftercare was discussed.  A pressure dressing was applied.  She has been advised to schedule an appointment Dr. Burney Gauze.  Primary osteoarthritis of both knees: She has good ROM of both knee joints.  No warmth or effusion was noted.  She is not having any discomfort in her knee joints at this time.  Primary osteoarthritis of both feet: According to the patient she previously fractured her right ankle and will occasionally have discomfort.  She was recently at the beach and was walking prolonged distances which she is not used to.  She has tenderness of the right ankle joint on exam today.  No warmth or inflammation was noted.  She was encouraged to ice, elevate, and use Voltaren gel topically as needed for pain relief.  She is not having any discomfort in her toes at this time.  Trochanteric bursitis of left hip: Resolved.  No tenderness upon palpation.   Psoriasis: She does not have any active psoriasis at this time.   Raynaud's disease without  gangrene: Not currently active.  She has not had any symptoms of raynaud's recently.  No digital ulcerations or signs of gangrene noted.  Good capillary refill on exam.    Other medical conditions are listed as follows:   Pruritus of palm - Resolved  History of hypertension  History of hypothyroidism  History of attention deficit disorder  History of hyperlipidemia  Orders: Orders Placed This Encounter  Procedures  .  Incision & Drainage  . Ambulatory referral to Hand Surgery   No orders of the defined types were placed in this encounter.   Follow-Up Instructions: Return in about 1 year (around 04/11/2021) for Osteoarthritis, Raynaud's syndrome.   Hazel Sams, PA-C  I examined and evaluated the patient with Hazel Sams PA.  She had a mucinous cyst on her left index finger which she wanted aspirated.  I attempted draining using 18-gauge needle but it was unsuccessful.  I will refer her to Dr. Burney Gauze for surgical resection of the mucinous cyst.  The plan of care was discussed as noted above.  Bo Merino, MD  Note - This record has been created using Editor, commissioning.  Chart creation errors have been sought, but may not always  have been located. Such creation errors do not reflect on  the standard of medical care.

## 2020-04-11 ENCOUNTER — Encounter: Payer: Self-pay | Admitting: Rheumatology

## 2020-04-11 ENCOUNTER — Ambulatory Visit (INDEPENDENT_AMBULATORY_CARE_PROVIDER_SITE_OTHER): Payer: BC Managed Care – PPO | Admitting: Rheumatology

## 2020-04-11 ENCOUNTER — Other Ambulatory Visit: Payer: Self-pay

## 2020-04-11 VITALS — BP 112/68 | HR 53 | Resp 13 | Ht 64.0 in | Wt 145.4 lb

## 2020-04-11 DIAGNOSIS — M170 Bilateral primary osteoarthritis of knee: Secondary | ICD-10-CM | POA: Diagnosis not present

## 2020-04-11 DIAGNOSIS — Z8679 Personal history of other diseases of the circulatory system: Secondary | ICD-10-CM

## 2020-04-11 DIAGNOSIS — M67442 Ganglion, left hand: Secondary | ICD-10-CM

## 2020-04-11 DIAGNOSIS — M19071 Primary osteoarthritis, right ankle and foot: Secondary | ICD-10-CM

## 2020-04-11 DIAGNOSIS — M7062 Trochanteric bursitis, left hip: Secondary | ICD-10-CM | POA: Diagnosis not present

## 2020-04-11 DIAGNOSIS — I7300 Raynaud's syndrome without gangrene: Secondary | ICD-10-CM

## 2020-04-11 DIAGNOSIS — M19041 Primary osteoarthritis, right hand: Secondary | ICD-10-CM

## 2020-04-11 DIAGNOSIS — L298 Other pruritus: Secondary | ICD-10-CM

## 2020-04-11 DIAGNOSIS — Z8639 Personal history of other endocrine, nutritional and metabolic disease: Secondary | ICD-10-CM

## 2020-04-11 DIAGNOSIS — Z8659 Personal history of other mental and behavioral disorders: Secondary | ICD-10-CM

## 2020-04-11 DIAGNOSIS — M19072 Primary osteoarthritis, left ankle and foot: Secondary | ICD-10-CM

## 2020-04-11 DIAGNOSIS — L409 Psoriasis, unspecified: Secondary | ICD-10-CM

## 2020-04-11 DIAGNOSIS — M19042 Primary osteoarthritis, left hand: Secondary | ICD-10-CM

## 2020-04-21 DIAGNOSIS — R2232 Localized swelling, mass and lump, left upper limb: Secondary | ICD-10-CM | POA: Diagnosis not present

## 2020-04-21 DIAGNOSIS — M19042 Primary osteoarthritis, left hand: Secondary | ICD-10-CM | POA: Diagnosis not present

## 2020-04-21 DIAGNOSIS — M7989 Other specified soft tissue disorders: Secondary | ICD-10-CM | POA: Diagnosis not present

## 2020-05-27 ENCOUNTER — Ambulatory Visit (INDEPENDENT_AMBULATORY_CARE_PROVIDER_SITE_OTHER): Payer: BC Managed Care – PPO | Admitting: Obstetrics and Gynecology

## 2020-05-27 ENCOUNTER — Encounter: Payer: Self-pay | Admitting: Obstetrics and Gynecology

## 2020-05-27 ENCOUNTER — Other Ambulatory Visit: Payer: Self-pay

## 2020-05-27 VITALS — BP 118/74

## 2020-05-27 DIAGNOSIS — R319 Hematuria, unspecified: Secondary | ICD-10-CM | POA: Diagnosis not present

## 2020-05-27 DIAGNOSIS — M545 Low back pain, unspecified: Secondary | ICD-10-CM

## 2020-05-27 NOTE — Progress Notes (Signed)
[name] 1956-08-03 [id number]  SUBJECTIVE:  64 y.o. [id number] female presents for hematuria and back pain.  No specific increase in urinary frequency or dysuria symptoms.  She says when she has had hematuria and back pain in the past she has a bladder infection.  No fever.  She does not think she is having any vaginal or rectal bleeding.  She has noticed blood tingeing the water in the toilet upon 2 voiding episodes recently.  Current Outpatient Medications  Medication Sig Dispense Refill  . ARMOUR THYROID 60 MG tablet TAKE 1 TABLET BY MOUTH 6 TIMES EVERY WEEK 80 tablet 3  . ARMOUR THYROID 90 MG tablet TAKE 1 TABLET BY MOUTH ONCE WEEKLY 30 tablet 3  . Barberry-Oreg Grape-Goldenseal (BERBERINE COMPLEX PO) Take by mouth daily.    . cetirizine (ZYRTEC) 10 MG tablet Take 10 mg by mouth daily.    . Cholecalciferol (VITAMIN D-3 PO) Take 1 tablet by mouth daily.    . Eluxadoline (VIBERZI) 100 MG TABS Take 2 tablets by mouth once a week. AS NEEDED    . Estradiol (VAGIFEM) 10 MCG TABS vaginal tablet Insert one tab intravaginally twice weekly. 24 tablet 4  . hydrocortisone 2.5 % lotion Apply topically 2 (two) times daily. 59 mL 0  . Probiotic Product (PROBIOTIC-10 PO) Take by mouth.    . sertraline (ZOLOFT) 50 MG tablet Take 1.5 tablets (75 mg total) by mouth daily. 145 tablet 4  . TURMERIC PO Take by mouth daily.    . valACYclovir (VALTREX) 1000 MG tablet Take 1 tablet (1,000 mg total) by mouth daily. Take twice daily for 3-5 days (Patient taking differently: Take 1,000 mg by mouth as needed. Take twice daily for 3-5 days) 30 tablet 3  . VITAMIN K PO Take by mouth.    . CRANBERRY PO Take by mouth daily. (Patient not taking: Reported on 05/27/2020)     No current facility-administered medications for this visit.   Allergies: Lomotil [diphenoxylate]  Patient's last menstrual period was 03/22/2015.  Past medical history,surgical history, problem list, medications, allergies, family history and  social history were all reviewed and documented as reviewed in the EPIC chart.  ROS:  Feeling well. No dyspnea or chest pain on exertion.  No abdominal pain, change in bowel habits, black or bloody stools.  No urinary tract symptoms. GYN ROS: No vaginal irritation, no abnormal bleeding, pelvic pain or discharge, no breast pain or new or enlarging lumps on self exam. No neurological complaints.   OBJECTIVE:  BP 118/74   LMP 03/22/2015  The patient appears well, alert, oriented x 3, in no distress. PELVIC EXAM: Deferred  Urinalysis 0-5 WBC, 3-10 RBC, 0-5 squamous epithelial cells, few bacteria, no yeast, trace leukocyte esterase, negative nitrite, clear color  ASSESSMENT:  64 y.o. [id number] here for hematuria and back pain symptoms of uncertain origin  PLAN:  Patient is reassured that there is no sign of a UTI on urinalysis today.  Antibiotics not indicated.  Will await urine culture.  She is hydrating with water, she could try Azo over-the-counter if any bladder spasm/pain symptoms, try cranberry juice once or twice daily.  Typically kidney stones resulted unilateral discomfort.  If any worsening symptoms recommend she come in for evaluation.  I did tell her to pay attention to see if she is having any vaginal bleeding which could cause the appearance of hematuria and if that appears to be the case she does come in for evaluation.   Joseph Pierini MD  05/27/20  

## 2020-05-29 LAB — URINALYSIS, COMPLETE W/RFL CULTURE
Bilirubin Urine: NEGATIVE
Glucose, UA: NEGATIVE
Hyaline Cast: NONE SEEN /LPF
Ketones, ur: NEGATIVE
Nitrites, Initial: NEGATIVE
Protein, ur: NEGATIVE
Specific Gravity, Urine: 1.01 (ref 1.001–1.03)
pH: 7 (ref 5.0–8.0)

## 2020-05-29 LAB — URINE CULTURE
MICRO NUMBER:: 10663033
SPECIMEN QUALITY:: ADEQUATE

## 2020-05-29 LAB — CULTURE INDICATED

## 2020-05-31 ENCOUNTER — Encounter: Payer: Self-pay | Admitting: Obstetrics and Gynecology

## 2020-05-31 NOTE — Telephone Encounter (Signed)
Dr. Delilah Shan had sent patient a note on her result. "[name], the urine culture result confirms absence of a bladder infection. How is the back pain doing? Any recurrence of blood in the urine? Dr. Delilah Shan"

## 2020-06-14 ENCOUNTER — Other Ambulatory Visit: Payer: Self-pay

## 2020-06-14 ENCOUNTER — Ambulatory Visit (INDEPENDENT_AMBULATORY_CARE_PROVIDER_SITE_OTHER): Payer: BC Managed Care – PPO | Admitting: Nurse Practitioner

## 2020-06-14 ENCOUNTER — Encounter: Payer: Self-pay | Admitting: Nurse Practitioner

## 2020-06-14 VITALS — BP 116/76 | Ht 64.0 in | Wt 141.0 lb

## 2020-06-14 DIAGNOSIS — M8589 Other specified disorders of bone density and structure, multiple sites: Secondary | ICD-10-CM

## 2020-06-14 DIAGNOSIS — Z13220 Encounter for screening for lipoid disorders: Secondary | ICD-10-CM

## 2020-06-14 DIAGNOSIS — Z01419 Encounter for gynecological examination (general) (routine) without abnormal findings: Secondary | ICD-10-CM | POA: Diagnosis not present

## 2020-06-14 DIAGNOSIS — Z1151 Encounter for screening for human papillomavirus (HPV): Secondary | ICD-10-CM

## 2020-06-14 DIAGNOSIS — N951 Menopausal and female climacteric states: Secondary | ICD-10-CM

## 2020-06-14 NOTE — Patient Instructions (Signed)

## 2020-06-14 NOTE — Addendum Note (Signed)
Addended by: Nelva Nay on: 06/14/2020 04:20 PM   Modules accepted: Orders

## 2020-06-14 NOTE — Progress Notes (Signed)
   MISCHA BRITTINGHAM 03-19-1956 [id number]   History:  64 y.o. G2P2002 presents for annual exam without GYN complaints. Postmenopausal- vagifem for vaginal dryness, no bleeding. HSV-1, Valtrex as needed for outbreaks. Osteopenia. Hypothyroidism managed by endocrinology.   Gynecologic History Patient's last menstrual period was 03/22/2015.   Contraception: post menopausal status Last Pap: 06/17/2017. Results were: ASCUS negative high risk HPV Last mammogram: 2020 per patient. Results were: normal Last colonoscopy: 11/10/2018. Results were: polyps, 5 year follow up Last Dexa: 08/2018. Results were: t score -1.1 FRAX 7.3% / 0.4%  Past medical history, past surgical history, family history and social history were all reviewed and documented in the EPIC chart.  ROS:  A ROS was performed and pertinent positives and negatives are included.  Exam:  Vitals:   06/14/20 1538  BP: 116/76  Weight: 141 lb (64 kg)  Height: 5\' 4"  (1.626 m)   Body mass index is 24.2 kg/m.  General appearance:  Normal Thyroid:  Symmetrical, normal in size, without palpable masses or nodularity. Respiratory  Auscultation:  Clear without wheezing or rhonchi Cardiovascular  Auscultation:  Regular rate, without rubs, murmurs or gallops  Edema/varicosities:  Not grossly evident Abdominal  Soft,nontender, without masses, guarding or rebound.  Liver/spleen:  No organomegaly noted  Hernia:  None appreciated  Skin  Inspection:  Grossly normal   Breasts: Examined lying and sitting.   Right: Without masses, retractions, discharge or axillary adenopathy.   Left: Without masses, retractions, discharge or axillary adenopathy. Gentitourinary   Inguinal/mons:  Normal without inguinal adenopathy  External genitalia:  Normal  BUS/Urethra/Skene's glands:  Normal  Vagina:  Atrophy, urethrocele  Cervix:  Normal  Uterus:  Retroverted, normal in size, shape and contour.  Midline and mobile  Adnexa/parametria:      Rt: Without masses or tenderness.   Lt: Without masses or tenderness.  Anus and perineum: Normal  Digital rectal exam: Normal sphincter tone without palpated masses or tenderness  Assessment/Plan:  64 y.o. [id number] for annual exam.  Well female exam with routine gynecological exam - Plan: CBC with Differential/Platelet, Comprehensive metabolic panel. Education provided on SBEs, importance of preventative screenings, current guidelines, high calcium diet, regular exercise, and multivitamin daily. Pap with HPV typing.   Lipid screening - Plan: Lipid panel. Will return when fasting. Elevated last year  Osteopenia of multiple sites - taking daily vitamin D supplement. Recommend regular exercise and weight bearing exercises. Bone density due in October.   Menopausal vaginal dryness - doing well on Vagifem  Follow up in 1 year for annual       Ramos, 3:49 PM 06/14/2020

## 2020-06-15 LAB — CBC WITH DIFFERENTIAL/PLATELET
Absolute Monocytes: 397 cells/uL (ref 200–950)
Basophils Absolute: 39 cells/uL (ref 0–200)
Basophils Relative: 0.6 %
Eosinophils Absolute: 111 cells/uL (ref 15–500)
Eosinophils Relative: 1.7 %
HCT: 40.3 % (ref 35.0–45.0)
Hemoglobin: 13 g/dL (ref 11.7–15.5)
Lymphs Abs: 2171 cells/uL (ref 850–3900)
MCH: 28.1 pg (ref 27.0–33.0)
MCHC: 32.3 g/dL (ref 32.0–36.0)
MCV: 87 fL (ref 80.0–100.0)
MPV: 11.4 fL (ref 7.5–12.5)
Monocytes Relative: 6.1 %
Neutro Abs: 3783 cells/uL (ref 1500–7800)
Neutrophils Relative %: 58.2 %
Platelets: 337 10*3/uL (ref 140–400)
RBC: 4.63 10*6/uL (ref 3.80–5.10)
RDW: 13.1 % (ref 11.0–15.0)
Total Lymphocyte: 33.4 %
WBC: 6.5 10*3/uL (ref 3.8–10.8)

## 2020-06-15 LAB — COMPREHENSIVE METABOLIC PANEL
AG Ratio: 1.7 (calc) (ref 1.0–2.5)
ALT: 23 U/L (ref 6–29)
AST: 22 U/L (ref 10–35)
Albumin: 4.5 g/dL (ref 3.6–5.1)
Alkaline phosphatase (APISO): 78 U/L (ref 37–153)
BUN: 14 mg/dL (ref 7–25)
CO2: 29 mmol/L (ref 20–32)
Calcium: 10.2 mg/dL (ref 8.6–10.4)
Chloride: 99 mmol/L (ref 98–110)
Creat: 0.82 mg/dL (ref 0.50–0.99)
Globulin: 2.6 g/dL (calc) (ref 1.9–3.7)
Glucose, Bld: 81 mg/dL (ref 65–99)
Potassium: 4.2 mmol/L (ref 3.5–5.3)
Sodium: 140 mmol/L (ref 135–146)
Total Bilirubin: 0.5 mg/dL (ref 0.2–1.2)
Total Protein: 7.1 g/dL (ref 6.1–8.1)

## 2020-06-15 LAB — PAP, TP IMAGING W/ HPV RNA, RFLX HPV TYPE 16,18/45: HPV DNA High Risk: NOT DETECTED

## 2020-06-20 ENCOUNTER — Other Ambulatory Visit: Payer: Self-pay

## 2020-06-20 ENCOUNTER — Other Ambulatory Visit: Payer: BC Managed Care – PPO

## 2020-06-20 DIAGNOSIS — Z13220 Encounter for screening for lipoid disorders: Secondary | ICD-10-CM | POA: Diagnosis not present

## 2020-06-20 LAB — LIPID PANEL
Cholesterol: 246 mg/dL — ABNORMAL HIGH (ref <200)
HDL: 75 mg/dL (ref >=50)
LDL Cholesterol (Calc): 151 mg/dL (calc) — ABNORMAL HIGH
Non-HDL Cholesterol (Calc): 171 mg/dL (calc) — ABNORMAL HIGH (ref <130)
Total CHOL/HDL Ratio: 3.3 (calc) (ref <5.0)
Triglycerides: 91 mg/dL (ref <150)

## 2020-06-21 ENCOUNTER — Other Ambulatory Visit: Payer: Self-pay

## 2020-06-21 DIAGNOSIS — E7800 Pure hypercholesterolemia, unspecified: Secondary | ICD-10-CM

## 2020-08-08 ENCOUNTER — Other Ambulatory Visit: Payer: Self-pay

## 2020-08-08 MED ORDER — ESTRADIOL 10 MCG VA TABS: ORAL_TABLET | VAGINAL | 3 refills | Status: DC

## 2020-08-13 DIAGNOSIS — Z20822 Contact with and (suspected) exposure to covid-19: Secondary | ICD-10-CM | POA: Diagnosis not present

## 2020-10-03 ENCOUNTER — Other Ambulatory Visit: Payer: Self-pay

## 2020-10-03 MED ORDER — SERTRALINE HCL 50 MG PO TABS: 75.0000 mg | ORAL_TABLET | Freq: Every day | ORAL | 2 refills | Status: AC

## 2020-10-04 ENCOUNTER — Ambulatory Visit: Payer: BC Managed Care – PPO | Admitting: Internal Medicine
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
## 2020-10-07 ENCOUNTER — Encounter: Payer: Self-pay | Admitting: Internal Medicine

## 2020-10-07 ENCOUNTER — Ambulatory Visit (INDEPENDENT_AMBULATORY_CARE_PROVIDER_SITE_OTHER): Payer: BC Managed Care – PPO | Admitting: Internal Medicine

## 2020-10-07 ENCOUNTER — Ambulatory Visit (INDEPENDENT_AMBULATORY_CARE_PROVIDER_SITE_OTHER): Payer: BC Managed Care – PPO | Admitting: Obstetrics and Gynecology

## 2020-10-07 ENCOUNTER — Other Ambulatory Visit: Payer: Self-pay

## 2020-10-07 ENCOUNTER — Encounter: Payer: Self-pay | Admitting: Obstetrics and Gynecology

## 2020-10-07 VITALS — BP 138/80 | HR 75 | Ht 64.0 in | Wt 143.0 lb

## 2020-10-07 DIAGNOSIS — R8271 Bacteriuria: Secondary | ICD-10-CM | POA: Diagnosis not present

## 2020-10-07 DIAGNOSIS — E890 Postprocedural hypothyroidism: Secondary | ICD-10-CM | POA: Diagnosis not present

## 2020-10-07 DIAGNOSIS — E042 Nontoxic multinodular goiter: Secondary | ICD-10-CM | POA: Diagnosis not present

## 2020-10-07 DIAGNOSIS — R300 Dysuria: Secondary | ICD-10-CM | POA: Diagnosis not present

## 2020-10-07 DIAGNOSIS — Z23 Encounter for immunization: Secondary | ICD-10-CM | POA: Diagnosis not present

## 2020-10-07 DIAGNOSIS — N898 Other specified noninflammatory disorders of vagina: Secondary | ICD-10-CM | POA: Diagnosis not present

## 2020-10-07 LAB — T4, FREE: Free T4: 0.67 ng/dL (ref 0.60–1.60)

## 2020-10-07 LAB — T3, FREE: T3, Free: 4 pg/mL (ref 2.3–4.2)

## 2020-10-07 LAB — WET PREP FOR TRICH, YEAST, CLUE

## 2020-10-07 LAB — TSH: TSH: 1.91 u[IU]/mL (ref 0.35–4.50)

## 2020-10-07 MED ORDER — ARMOUR THYROID 60 MG PO TABS: ORAL_TABLET | ORAL | 3 refills | Status: DC

## 2020-10-07 MED ORDER — ARMOUR THYROID 90 MG PO TABS: ORAL_TABLET | ORAL | 3 refills | Status: DC
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
## 2020-10-07 NOTE — Patient Instructions (Signed)
Please continue: °- Armour 90 mg 1/7 days and 60 mg 6/7 days ° °Take the thyroid hormone every day, with water, at least 30 minutes before breakfast, separated by at least 4 hours from: °- acid reflux medications °- calcium °- iron °- multivitamins ° °Please stop at the lab. ° °Please return 1 year for an appt. ° °

## 2020-10-07 NOTE — Progress Notes (Signed)
[name] [birth year] [id number]  SUBJECTIVE:  64 y.o. [id number] female presents for post void urinary urgency and pressure.  After emptying her bladder, she frequently needs to empty more or at least feels pressure like she needs to.  Typically does not produce much urine on the second void.  She says these are symptoms that are different than whenever she has had a UTI in the past.  She does continue to take the Vagifem tablet weekly.  She has had these postvoid symptoms going on for about 7 weeks now.  This last summer she had an episode of hematuria and back pain without evidence of UTI on the UA, and this then self resolved.  No hematuria currently.  Current Outpatient Medications  Medication Sig Dispense Refill  . ARMOUR THYROID 60 MG tablet TAKE 1 TABLET BY MOUTH 6 TIMES EVERY WEEK 80 tablet 3  . ARMOUR THYROID 90 MG tablet TAKE 1 TABLET BY MOUTH ONCE WEEKLY 30 tablet 3  . Barberry-Oreg Grape-Goldenseal (BERBERINE COMPLEX PO) Take by mouth daily.    . cetirizine (ZYRTEC) 10 MG tablet Take 10 mg by mouth daily.    Marland Kitchen CRANBERRY PO Take by mouth daily.     . Eluxadoline (VIBERZI) 100 MG TABS Take 2 tablets by mouth once a week. AS NEEDED    . Estradiol (VAGIFEM) 10 MCG TABS vaginal tablet Insert one tab intravaginally twice weekly. 24 tablet 3  . hydrocortisone 2.5 % lotion Apply topically 2 (two) times daily. 59 mL 0  . Probiotic Product (PROBIOTIC-10 PO) Take by mouth.    . sertraline (ZOLOFT) 50 MG tablet Take 1.5 tablets (75 mg total) by mouth daily. 135 tablet 2  . TURMERIC PO Take by mouth daily.    . valACYclovir (VALTREX) 1000 MG tablet Take 1 tablet (1,000 mg total) by mouth daily. Take twice daily for 3-5 days (Patient taking differently: Take 1,000 mg by mouth as needed. Take twice daily for 3-5 days) 30 tablet 3  . VITAMIN D PO Take by mouth.    Marland Kitchen VITAMIN K PO Take by mouth.     No current facility-administered medications for this visit.   Allergies: Lomotil  [diphenoxylate]  Patient's last menstrual period was 03/22/2015.  Past medical history,surgical history, problem list, medications, allergies, family history and social history were all reviewed and documented as reviewed in the EPIC chart.  ROS: Positives and negatives as reviewed above in HPI.  OBJECTIVE:  LMP 03/22/2015  The patient appears well, alert, oriented x 3, in no distress.  PELVIC EXAM: VULVA: normal appearing vulva with no masses, tenderness or lesions, VAGINA: Mild grade 1 cystocele, urethrocele, mildly tender over palpation of the bladder/anterior vaginal wall, normal appearing vagina with atrophic change and normal color and scant white discharge, no lesions, CERVIX: normal appearing atrophic cervix without discharge or lesions, UTERUS: Retroverted uterus is normal size, shape, consistency and nontender, ADNEXA: normal adnexa in size, nontender and no masses,  Urinalysis unremarkable WET MOUNT done - results: negative for pathogens, normal epithelial cells  Chaperone: Caryn Bee present during the examination  ASSESSMENT:  64 y.o. [id number] here for dysuria symptoms in the presence of normal urinalysis  PLAN:  We discussed her symptoms and that her urinalysis does not suggest a urinary tract infection.  She has a mild urethrocele and cystocele noted on the examination today.  Her bladder is mildly tender.  With the episode of hematuria last summer and her urinary symptoms, I question if she could have interstitial  cystitis.  I recommended urology referral as the next step in her evaluation.  Will have staff reach out to the patient to help coordinate this.  In the meantime I recommend timed voids and timing her fluid consumption appropriately.   Joseph Pierini MD 10/07/20

## 2020-10-10 ENCOUNTER — Telehealth: Payer: Self-pay | Admitting: *Deleted

## 2020-10-10 DIAGNOSIS — R3915 Urgency of urination: Secondary | ICD-10-CM

## 2020-10-10 DIAGNOSIS — R350 Frequency of micturition: Secondary | ICD-10-CM

## 2020-10-10 LAB — URINE CULTURE
MICRO NUMBER:: 11196974
SPECIMEN QUALITY:: ADEQUATE

## 2020-10-10 LAB — URINALYSIS, COMPLETE W/RFL CULTURE
Bilirubin Urine: NEGATIVE
Glucose, UA: NEGATIVE
Hyaline Cast: NONE SEEN /LPF
Ketones, ur: NEGATIVE
Nitrites, Initial: NEGATIVE
Protein, ur: NEGATIVE
Specific Gravity, Urine: 1.01 (ref 1.001–1.03)
pH: 7 (ref 5.0–8.0)

## 2020-10-10 LAB — CULTURE INDICATED

## 2020-10-10 NOTE — Telephone Encounter (Signed)
-----   Message from Joseph Pierini, MD sent at 10/07/2020 12:08 PM EST ----- Regarding: urology referral Please help this patient with a referral to urology for work-up of urinary frequency and post void urgency/pressure x7 week without evidence of a UTI.  Mild cystocele on exam.  Question if she has interstitial cystitis? Thank you

## 2020-10-10 NOTE — Telephone Encounter (Signed)
I called patient and explained referral could be done at [facility], however they are location in [place]. Or could be done at [facility], I did let her know the delay in the office calling patients to schedule. Patient verbalized she would like to proceed with [name] referral.

## 2020-10-11 MED ORDER — SULFAMETHOXAZOLE-TRIMETHOPRIM 800-160 MG PO TABS: 1.0000 | ORAL_TABLET | Freq: Two times a day (BID) | ORAL | 0 refills | Status: AC

## 2020-10-11 NOTE — Progress Notes (Signed)
I sent in an rx to the patient's pharmacy if anyone could please just notify her of the above msg. Thanks

## 2020-10-11 NOTE — Addendum Note (Signed)
Addended by: Joseph Pierini D on: 10/11/2020 10:18 AM   Modules accepted: Orders

## 2020-10-12 NOTE — Telephone Encounter (Signed)
Patient scheduled on 11/23/20 with Alyson Ingles Candee Furbish, MD

## 2020-10-17 DIAGNOSIS — Z1231 Encounter for screening mammogram for malignant neoplasm of breast: Secondary | ICD-10-CM | POA: Diagnosis not present

## 2020-11-23 ENCOUNTER — Ambulatory Visit: Payer: BC Managed Care – PPO | Admitting: Urology

## 2020-11-30 ENCOUNTER — Other Ambulatory Visit: Payer: Self-pay | Admitting: Internal Medicine

## 2020-12-09 ENCOUNTER — Telehealth: Payer: Self-pay | Admitting: *Deleted

## 2020-12-09 DIAGNOSIS — R350 Frequency of micturition: Secondary | ICD-10-CM

## 2020-12-09 DIAGNOSIS — R300 Dysuria: Secondary | ICD-10-CM

## 2020-12-09 DIAGNOSIS — R3915 Urgency of urination: Secondary | ICD-10-CM

## 2020-12-09 NOTE — Telephone Encounter (Signed)
Patient called back to follow up from telephone encounter on 10/10/20 reports the [place] office has canceled her appointment twice and patient would prefer to be seen in [place] location. I checked with referral coordinator at [place] office and was informed she can be seen at that office verses [facility].   Per Dr.Kendall referral message "work-up of urinary frequency and post void urgency/pressure x7 week without evidence of a UTI.  Mild cystocele on exam.  Question if she has interstitial cystitis?"  Referral placed in epic at urogyn they will call patient to schedule. Left detailed message on patient cell.

## 2020-12-14 NOTE — Telephone Encounter (Signed)
Patient scheduled on 12/30/20

## 2020-12-16 ENCOUNTER — Ambulatory Visit: Payer: BC Managed Care – PPO | Admitting: Urology

## 2020-12-28 ENCOUNTER — Telehealth: Payer: Self-pay

## 2020-12-28 NOTE — Telephone Encounter (Signed)
Attempt made to contact the patient re: pre appt call to review med history and medication before her appt. Pt was not available. LM on the VM for the patient to call back.

## 2020-12-30 ENCOUNTER — Encounter: Payer: Self-pay | Admitting: Obstetrics and Gynecology

## 2020-12-30 ENCOUNTER — Ambulatory Visit (INDEPENDENT_AMBULATORY_CARE_PROVIDER_SITE_OTHER): Payer: BC Managed Care – PPO | Admitting: Obstetrics and Gynecology

## 2020-12-30 ENCOUNTER — Other Ambulatory Visit: Payer: Self-pay

## 2020-12-30 VITALS — BP 111/68 | HR 76 | Ht 67.0 in | Wt 143.0 lb

## 2020-12-30 DIAGNOSIS — N362 Urethral caruncle: Secondary | ICD-10-CM | POA: Diagnosis not present

## 2020-12-30 DIAGNOSIS — R350 Frequency of micturition: Secondary | ICD-10-CM | POA: Diagnosis not present

## 2020-12-30 DIAGNOSIS — R300 Dysuria: Secondary | ICD-10-CM | POA: Diagnosis not present

## 2020-12-30 LAB — POCT URINALYSIS DIPSTICK
Appearance: NORMAL
Bilirubin, UA: NEGATIVE
Blood, UA: NEGATIVE
Glucose, UA: NEGATIVE
Ketones, UA: NEGATIVE
Leukocytes, UA: NEGATIVE
Nitrite, UA: NEGATIVE
Protein, UA: NEGATIVE
Spec Grav, UA: 1.01 (ref 1.010–1.025)
Urobilinogen, UA: 0.2 E.U./dL
pH, UA: 5 (ref 5.0–8.0)

## 2020-12-30 MED ORDER — ESTRADIOL 0.1 MG/GM VA CREA: 0.5000 g | TOPICAL_CREAM | VAGINAL | 11 refills | Status: DC

## 2020-12-30 MED ORDER — LIDOCAINE HCL 2 % EX GEL: 1.0000 "application " | CUTANEOUS | 2 refills | Status: AC | PRN

## 2020-12-30 NOTE — Patient Instructions (Addendum)
Today we talked about ways to manage bladder urgency such as altering your diet to avoid irritative beverages and foods (bladder diet) as well as attempting to decrease stress and other exacerbating factors.  You can also chew a plain Tums 1-3 times per day to make your urine less acidic, especially if you have eating/drinking acidic things.   There is a website with helpful information for people with bladder irritation, called the [place] at https://www.ic-network.com. This website has more information about a healthy bladder diet and patient forums for support.  The Most Bothersome Foods* The Least Bothersome Foods*  Coffee - Regular & Decaf Tea - caffeinated Carbonated beverages - cola, non-colas, diet & caffeine-free Alcohols - Beer, Red Wine, White Wine, Champagne Fruits - Grapefruit, [place], Orange, [employer] - Cranberry, Grapefruit, Orange, Pineapple Vegetables - Tomato & Tomato Products Flavor Enhancers - Hot peppers, Spicy foods, Chili, Horseradish, Vinegar, Monosodium glutamate (MSG) Artificial Sweeteners - NutraSweet, Sweet 'N Low, Equal (sweetener), Saccharin Ethnic foods - [place], [place], [place] food Express Scripts - low-fat & whole Fruits - Bananas, Blueberries, Honeydew melon, Pears, Raisins, Watermelon Vegetables - Broccoli, Brussels Sprouts, [place], Carrots, Cauliflower, [place], Cucumber, Mushrooms, Peas, Radishes, Squash, Zucchini, White potatoes, Sweet potatoes & yams Poultry - Chicken, Eggs, [place], [employer] - Beef, Programmer, multimedia, Lamb Seafood - Shrimp, [place] fish, Salmon Grains - Oat, Rice Snacks - Pretzels, Popcorn  *Lissa Morales et al. Diet and its role in interstitial cystitis/bladder pain syndrome (IC/BPS) and comorbid conditions. Stokes 2012 Jan 11.     Start vaginal estrogen therapy nightly for two weeks then 2 times weekly at night for treatment of vaginal atrophy (dryness of the vaginal tissues).  Please let us know if the prescription is  too expensive and we can look for alternative options.   Use lidocaine jelly as needed. If insurance does not cover, check GoodRx.com for prices.

## 2020-12-30 NOTE — Progress Notes (Signed)
[facility] New Patient Evaluation and Consultation  Referring Provider: Joseph Pierini, MD PCP: Huel Cote, NP (Inactive) Date of Service: 12/30/2020  SUBJECTIVE Chief Complaint: New Patient (Initial Visit) (C/o pain at urethral opening)  History of Present Illness: [name] is a 65 y.o. White or Caucasian female seen in consultation at the request of Dr. Delilah Shan for evaluation of dysuria.    Review of records significant for: Has had urinary frequency and post void urgency and pressure. Has been using the Vagifem tablet weekly Mild cystocele on exam.   Urine culture on 10/07/20: 50-100, 000 Klebsiella pneumoniae  Urinary Symptoms: Does not leak urine.  Day time voids 8.  Nocturia: 0-1 times per night to void. Voiding dysfunction: she empties her bladder well.  does not use a catheter to empty bladder.  When urinating, she feels dribbling after finishing Drinks: 16oz tea in AM (with caffeine, sometimes with stevia), 2 cans carbonated water, 20 oz plain water per day, occasional glass wine  UTIs: 1 UTI's in the last year.   Denies history of blood in urine and kidney or bladder stones  Pelvic Organ Prolapse Symptoms:                  She Denies a feeling of a bulge the vaginal area.   Bowel Symptom: Bowel movements: 1 time(s) per day Stool consistency: soft  Straining: no.  Splinting: no.  Incomplete evacuation: no.  She Denies accidental bowel leakage / fecal incontinence Bowel regimen: none Last colonoscopy: unsure- results were negative  Sexual Function Sexually active: yes.  Pain with sex: No  Pelvic Pain Denies pelvic pain Location: urethra Pain occurs: comes and goes Prior pain treatment: Azo- did not help.   Pain started in the beginning of September. Had an infection in November. Had some improvement after taking medication. Has pain with urination at the opening of the urethra and up to the bladder. Has more pain sometimes when  clothes rub against the area.   Taking vagifem tablets for about a year.   Past Medical History:  Past Medical History:  Diagnosis Date  . Arrhythmia 2013   PVCs,-monitor  . Arthritis   . Colon polyps 2008   BENIGN  . Depression    PMDD  . Hyperlipidemia   . Kidney infection   . Osteopenia 08/2018   T score -1.5 FRAX 7.3% / 0.4%  . Palpitations   . Postablative hypothyroidism   . Raynaud's phenomenon   . Rotator cuff disorder    TEAR-     Past Surgical History:   Past Surgical History:  Procedure Laterality Date  . DOPPLER ECHOCARDIOGRAPHY  08/14/2008   EF => 55% , no valvular pathology  . EVENT MONITOR  08/15/2012-08/28/2012   NSR with PVC's ,PSVT vs Aflutter WITH 2:1  . HAND SURGERY Left   . NM MYOCAR PERF WALL MOTION  09/04/2012   EF 66% ,LV normal, exercise capcity 11 mets -completeing stae 3 and 1 minute into stage 4,developed some mild chest pain and throat  tightness caused to stop exercise. [facility] TM scor  2 - MOD RISK  . OOPHORECTOMY  2010   LAP RSO  . right heel surgery Right 04/2018  . ROTATOR CUFF REPAIR    . THYROID LOBECTOMY     RIGHT     Past OB/GYN History: OB History  Gravida Para Term Preterm AB Living  2 2 2     2   SAB IAB Ectopic Multiple Live Births  2    # Outcome Date GA Lbr Len/2nd Weight Sex Delivery Anes PTL Lv  2 Term     M Vag-Spont  N LIV  1 Term     F Vag-Spont  N LIV    Menopausal: Yes, at age 69, Denies vaginal bleeding since menopause Last pap smear was 05/2020- negative.  Any history of abnormal pap smears: no.   Medications: She has a current medication list which includes the following prescription(s): armour thyroid, armour thyroid, cetirizine, cranberry, eluxadoline, [START ON 01/02/2021] estradiol, hydrocortisone, lidocaine, probiotic product, sertraline, turmeric, valacyclovir, vitamin d, and vitamin k.   Allergies: Patient is allergic to lomotil [diphenoxylate].   Social History:  Social History   Tobacco  Use  . Smoking status: Never Smoker  . Smokeless tobacco: Never Used  Vaping Use  . Vaping Use: Never used  Substance Use Topics  . Alcohol use: Yes    Alcohol/week: 0.0 standard drinks    Comment: occ, two glasses of wine a week  . Drug use: Never    Relationship status: married She lives with husband.   She is not employed. Regular exercise: Yes: plays tennis, yoga, golf History of abuse: No  Family History:   Family History  Problem Relation Age of Onset  . Hypertension Father   . Lung cancer Father   . Brain cancer Father   . Hypertension Mother   . Heart attack Mother 41  . Osteoporosis Mother   . Obesity Brother   . Obesity Brother   . Heart attack Maternal Grandfather   . Migraines Daughter      Review of Systems: Review of Systems  Constitutional: Negative for fever, malaise/fatigue and weight loss.  Respiratory: Negative for cough, shortness of breath and wheezing.   Cardiovascular: Negative for chest pain, palpitations and leg swelling.  Gastrointestinal: Negative for abdominal pain and blood in stool.  Genitourinary: Positive for dysuria.  Musculoskeletal: Negative for myalgias.  Skin: Negative for rash.  Neurological: Negative for dizziness and headaches.  Endo/Heme/Allergies: Does not bruise/bleed easily.  Psychiatric/Behavioral: Negative for depression. The patient is not nervous/anxious.      OBJECTIVE Physical Exam: Vitals:   12/30/20 1528  BP: 111/68  Pulse: 76  Weight: 143 lb (64.9 kg)  Height: 5\' 7"  (1.702 m)    Physical Exam Constitutional:      General: She is not in acute distress. Pulmonary:     Effort: Pulmonary effort is normal.  Abdominal:     General: There is no distension.     Palpations: Abdomen is soft.     Tenderness: There is no abdominal tenderness. There is no rebound.  Musculoskeletal:        General: No swelling. Normal range of motion.  Skin:    General: Skin is warm and dry.     Findings: No rash.   Neurological:     Mental Status: She is alert and oriented to person, place, and time.  Psychiatric:        Mood and Affect: Mood normal.        Behavior: Behavior normal.      GU / Detailed Urogynecologic Evaluation:  Pelvic Exam: Normal external female genitalia; Bartholin's and Skene's glands normal in appearance; urethral meatus with small caruncle, no urethral masses or discharge.   CST: negative   Qtip test: positive allodynia at right side introitus and urethra  Speculum exam reveals normal vaginal mucosa with atrophy. Cervix normal appearance. Uterus normal single, nontender. Adnexa no mass, fullness,  tenderness.    Pelvic floor strength I/V  Pelvic floor musculature: Right levator non-tender, Right obturator non-tender, Left levator non-tender, Left obturator non-tender  POP-Q:   POP-Q  -1.5                                            Aa   -1.5                                           Ba  -6                                              C   3                                            Gh  3.5                                            Pb  9                                            tvl   -1.5                                            Ap  -1.5                                            Bp  -8.5                                              D     Rectal Exam:  Normal external rectum  Post-Void Residual (PVR) by Bladder Scan: In order to evaluate bladder emptying, we discussed obtaining a postvoid residual and she agreed to this procedure.  Procedure: The ultrasound unit was placed on the patient's abdomen in the suprapubic region after the patient had voided. A PVR of 16 ml was obtained by bladder scan.  Laboratory Results: POC urine: negative  I visualized the urine specimen, noting the specimen to be clear yellow  ASSESSMENT AND PLAN Ms. Raia is a 65 y.o. with:  1. Dysuria   2. Urethral caruncle   3. Urinary frequency     1.  Dysuria/ urethral caruncle - Discussed that urethral caruncle can cause irritative voiding symptoms. Will change vagifem to estrace cream. She should place the cream on the urethra nightly for two weeks then twice weekly after.  -  She has some allodynia at the introitus/ urethra that may also be contributing. Will prescribe lidocaine jelly 2% to use in this area as needed.  - She can also use Azo OTC if she has burning.  - Discussed possibility of interstitial cystitis as a diagnosis, but will start with conservative measures as this can also be due to irritation from caruncle/ prior UTI.  - She will limit bladder irritants such as tea and carbonated beverages. List provided with common irritants.  - She should also avoid tight fitting clothing. Can also consider using a peri bottle instead of toilet paper to reduce irritation.   2. Frequency - no sign of infection today with POC urine.   Return 6 weeks to reassess symptoms.   Jaquita Folds, MD   Medical Decision Making:  - Reviewed/ ordered a clinical laboratory test - Review and summation of prior records

## 2021-01-19 ENCOUNTER — Ambulatory Visit: Payer: BC Managed Care – PPO | Admitting: Urology

## 2021-02-02 DIAGNOSIS — Z86010 Personal history of colonic polyps: Secondary | ICD-10-CM | POA: Diagnosis not present

## 2021-02-02 DIAGNOSIS — R1011 Right upper quadrant pain: Secondary | ICD-10-CM | POA: Diagnosis not present

## 2021-02-02 DIAGNOSIS — K580 Irritable bowel syndrome with diarrhea: Secondary | ICD-10-CM | POA: Diagnosis not present

## 2021-02-03 ENCOUNTER — Other Ambulatory Visit (HOSPITAL_COMMUNITY): Payer: Self-pay | Admitting: Gastroenterology

## 2021-02-03 ENCOUNTER — Other Ambulatory Visit: Payer: Self-pay | Admitting: Gastroenterology

## 2021-02-03 DIAGNOSIS — R1011 Right upper quadrant pain: Secondary | ICD-10-CM

## 2021-02-08 NOTE — Progress Notes (Signed)
[facility] Return Visit  SUBJECTIVE  History of Present Illness: Abril Darene Lamer Darrington is a 65 y.o. female seen in follow-up for dysuria and urethral caruncle. Plan at last visit was to start vaginal estrace and lidocaine jelly. She also planned to limit tea and carbonated beverages.   Drinks only water now. Feels the pain in the area of the urethra has improved. Using estrace twice a week. Used the lidocaine jelly for a while but does not need it anymore. Used AZO and did not help her symptoms. Still has burning with urination, not every time. Urinating about 10 times per day due to drinking water.   IBS is back and this has upset her stomach. On a very bland diet.   Past Medical History: Patient  has a past medical history of Arrhythmia (2013), Arthritis, Colon polyps (2008), Depression, Hyperlipidemia, IBS (irritable bowel syndrome), Kidney infection, Osteopenia (08/2018), Palpitations, Postablative hypothyroidism, Raynaud's phenomenon, and Rotator cuff disorder.   Past Surgical History: She  has a past surgical history that includes Oophorectomy (2010); Rotator cuff repair; Thyroid lobectomy; doppler echocardiography (08/14/2008); NM MYOCAR PERF WALL MOTION (09/04/2012); EVENT MONITOR (08/15/2012-08/28/2012); Hand surgery (Left); and right heel surgery (Right, 04/2018).   Medications: She has a current medication list which includes the following prescription(s): armour thyroid, armour thyroid, cetirizine, cranberry, eluxadoline, estradiol, hydrocortisone, hydroxyzine, lidocaine, probiotic product, sertraline, turmeric, valacyclovir, vitamin d, and vitamin k.   Allergies: Patient is allergic to lomotil [diphenoxylate].   Social History: Patient  reports that she has never smoked. She has never used smokeless tobacco. She reports current alcohol use. She reports that she does not use drugs.      OBJECTIVE     Physical Exam: Vitals:   02/10/21 0940  BP: 108/71  Pulse: 60   Weight: 143 lb (64.9 kg)  Height: 5\' 4"  (1.626 m)   Gen: No apparent distress, A&O x 3.  Detailed Urogynecologic Evaluation:  Deferred. Prior exam showed (12/30/20):  Pelvic floor musculature: Right levator non-tender, Right obturator non-tender, Left levator non-tender, Left obturator non-tender  POP-Q:   POP-Q  -1.5                                            Aa   -1.5                                           Ba  -6                                              C   3                                            Gh  3.5                                            Pb  9  tvl   -1.5                                            Ap  -1.5                                            Bp  -8.5                                              D      POC urine: trace blood, otherwise negative  ASSESSMENT AND PLAN    Ms. Kosa is a 65 y.o. with:  1. Dysuria    Bladder pain/ dysuria -For irritative bladder we reviewed treatment options including altering her diet to avoid irritative beverages and foods as well as attempting to decrease stress and other exacerbating factors.  We also discussed using pyridium and similar over-the-counter medications for pain relief as needed. We discussed the pentad of medications including Elmiron, Tums, an antihistamine such as Vistaril, amitriptyline, and L-arginine.  We also discussed in-office bladder instillations for pain flares, as well as cystoscopy with hydrodistention in the operating room, which can be both diagnostic and therapeutic. She was also given information on the [place] at https://www.ic-network.com for bladder diet suggestions and patient forums for support. - She will start with vistaril nightly and will get a supplement with L-arginine. She would also like to start bladder instillations. Will plan for a series of 6 weekly instillations.  - POC urine negative for infection  today.   Jaquita Folds, MD  Time spent: I spent 20 minutes dedicated to the care of this patient on the date of this encounter to include pre-visit review of records, face-to-face time with the patient and post visit documentation.

## 2021-02-10 ENCOUNTER — Ambulatory Visit (INDEPENDENT_AMBULATORY_CARE_PROVIDER_SITE_OTHER): Payer: BC Managed Care – PPO | Admitting: Obstetrics and Gynecology

## 2021-02-10 ENCOUNTER — Other Ambulatory Visit: Payer: Self-pay

## 2021-02-10 ENCOUNTER — Encounter: Payer: Self-pay | Admitting: Obstetrics and Gynecology

## 2021-02-10 VITALS — BP 108/71 | HR 60 | Ht 64.0 in | Wt 143.0 lb

## 2021-02-10 DIAGNOSIS — R300 Dysuria: Secondary | ICD-10-CM

## 2021-02-10 MED ORDER — HYDROXYZINE HCL 25 MG PO TABS: 25.0000 mg | ORAL_TABLET | Freq: Every evening | ORAL | 5 refills | Status: DC

## 2021-02-10 NOTE — Patient Instructions (Addendum)
Interstitial Cystitis/ Bladder Pain treatment:  Avoid irriative foods and beverages and identify other triggers Work to decrease stress- meditation, yoga Medications for bladder pain: pyridium (Azo), methenamine, Uribel Medications: amitiptyline, hydroxyzine (anti-histamine), tums, L-arginine, Elmiron  Bladder instillations for pain flares Cystoscopy with hydrodistention  The IC Network at https://www.ic-network.com is helpful for bladder diet suggestions and forums for support.   Additional treatments to try for relief of IC symptoms:  Over the counter natural supplements: -Bladder Ease by Vitanica, Bladder Rest or Bladder Builder (all contain L-arginine) - helps to rebuild protective bladder layer and reduce bladder pain -Marshmallow Root (relieves inflammation in the urinary tract) - pills available or drink as a tea -Aloe Vera: Desert Harvest Aloe Vera capsules,  AloePath (also contains L-arginine and calcium cabonate)- can take several months to see improvement -Fish oil - 4 g daily -Tumeric (standardized to 95% curcuminoids) - 500 mg three times daily  -Mindfulness practice (yoga, meditation, deep breathing)   -If constipation as well, Magnesium supplement daily (reduces bladder spasms, helps constipation)) -If IBS as well, try low FODMAP or SIBO diet  - https://www.siboinfo.com/diet.html  

## 2021-02-13 ENCOUNTER — Other Ambulatory Visit: Payer: Self-pay

## 2021-02-13 ENCOUNTER — Ambulatory Visit (HOSPITAL_COMMUNITY)
Admission: RE | Admit: 2021-02-13 | Discharge: 2021-02-13 | Disposition: A | Discharge status: Home / Self Care | Payer: BC Managed Care – PPO | Source: Ambulatory Visit | Attending: Gastroenterology | Admitting: Gastroenterology

## 2021-02-13 DIAGNOSIS — R109 Unspecified abdominal pain: Secondary | ICD-10-CM | POA: Diagnosis not present

## 2021-02-13 DIAGNOSIS — K8020 Calculus of gallbladder without cholecystitis without obstruction: Secondary | ICD-10-CM | POA: Diagnosis not present

## 2021-02-13 DIAGNOSIS — R1011 Right upper quadrant pain: Secondary | ICD-10-CM | POA: Diagnosis not present

## 2021-02-27 ENCOUNTER — Other Ambulatory Visit: Payer: Self-pay

## 2021-02-27 ENCOUNTER — Ambulatory Visit (HOSPITAL_COMMUNITY)
Admission: RE | Admit: 2021-02-27 | Discharge: 2021-02-27 | Disposition: A | Discharge status: Home / Self Care | Payer: BC Managed Care – PPO | Source: Ambulatory Visit | Attending: Gastroenterology | Admitting: Gastroenterology

## 2021-02-27 DIAGNOSIS — R112 Nausea with vomiting, unspecified: Secondary | ICD-10-CM | POA: Diagnosis not present

## 2021-02-27 DIAGNOSIS — R1011 Right upper quadrant pain: Secondary | ICD-10-CM

## 2021-02-27 MED ORDER — TECHNETIUM TC 99M MEBROFENIN IV KIT
5.1000 | PACK | Freq: Once | INTRAVENOUS | Status: AC | PRN
  Administered 2021-02-27: 5.1 via INTRAVENOUS

## 2021-03-01 ENCOUNTER — Ambulatory Visit (INDEPENDENT_AMBULATORY_CARE_PROVIDER_SITE_OTHER): Payer: BC Managed Care – PPO

## 2021-03-01 VITALS — BP 103/65 | HR 87 | Wt 143.0 lb

## 2021-03-01 DIAGNOSIS — R300 Dysuria: Secondary | ICD-10-CM | POA: Diagnosis not present

## 2021-03-01 MED ORDER — HEPARIN SODIUM (PORCINE) 10000 UNIT/ML IJ SOLN
10000.0000 [IU] | Freq: Once | INTRAMUSCULAR | Status: AC
  Administered 2021-03-01: 10000 [IU] via INTRAVESICAL

## 2021-03-01 MED ORDER — LIDOCAINE HCL 2 % IJ SOLN
20.0000 mL | Freq: Once | INTRAMUSCULAR | Status: AC
  Administered 2021-03-01: 400 mg

## 2021-03-01 MED ORDER — SODIUM BICARBONATE 8.4 % IV SOLN
5.0000 mL | Freq: Once | INTRAVENOUS | Status: AC
  Administered 2021-03-01: 5 mL

## 2021-03-01 MED ORDER — BUPIVACAINE HCL (PF) 0.25 % IJ SOLN
20.0000 mL | Freq: Once | INTRAMUSCULAR | Status: AC
  Administered 2021-03-01: 20 mL

## 2021-03-01 NOTE — Progress Notes (Signed)
The patient was identified and verbally consented for the procedure.  The urethra was prepped with Betadine.  Pt cathed for 5cc A 16 Fr foley catheter was inserted and attached to a 60 mL syringe with the plunger removed.  The medication was slowly poured into the bladder via the syringe and foley.  The medication consisted of:  Lidocaine 2%, 17mL Bupivicaine 0.25%, 10,000 units/mL, 76mL  Heparin 2mL  Sodium Bicarbonate 8.4% 56mL   The foley was removed and the patient was asked to hold the liquids in her bladder for 30-60 minutes if possible.   Precautions were given and patient was instructed to call the office or on-call number for any concerns.

## 2021-03-08 ENCOUNTER — Other Ambulatory Visit: Payer: Self-pay

## 2021-03-08 ENCOUNTER — Ambulatory Visit (INDEPENDENT_AMBULATORY_CARE_PROVIDER_SITE_OTHER): Payer: BC Managed Care – PPO

## 2021-03-08 DIAGNOSIS — R300 Dysuria: Secondary | ICD-10-CM

## 2021-03-08 MED ORDER — HEPARIN SODIUM (PORCINE) 10000 UNIT/ML IJ SOLN
10000.0000 [IU] | Freq: Once | INTRAMUSCULAR | Status: AC
  Administered 2021-03-08: 10000 [IU] via INTRAVESICAL

## 2021-03-08 MED ORDER — SODIUM BICARBONATE 8.4 % IV SOLN
5.0000 mL | Freq: Once | INTRAVENOUS | Status: AC
  Administered 2021-03-08: 5 mL

## 2021-03-08 MED ORDER — BUPIVACAINE HCL (PF) 0.25 % IJ SOLN
20.0000 mL | Freq: Once | INTRAMUSCULAR | Status: AC
  Administered 2021-03-08: 20 mL

## 2021-03-08 MED ORDER — LIDOCAINE HCL 2 % IJ SOLN
20.0000 mL | Freq: Once | INTRAMUSCULAR | Status: AC
  Administered 2021-03-08: 400 mg

## 2021-03-08 NOTE — Progress Notes (Signed)
The patient was identified and verbally consented for the procedure.  The urethra was prepped with Betadine.  Pt cathed for 0cc A 16 Fr foley catheter was inserted and attached to a 60 mL syringe with the plunger removed.  The medication was slowly poured into the bladder via the syringe and foley.  The medication consisted of:  Lidocaine 2%, 55mL Bupivicaine 0.25%, 10,000 units/mL, 15mL  Heparin 76mL  Sodium Bicarbonate 8.4% 69mL   The foley was removed and the patient was asked to hold the liquids in her bladder for 30-60 minutes if possible.   Precautions were given and patient was instructed to call the office or on-call number for any concerns.

## 2021-03-15 ENCOUNTER — Ambulatory Visit (INDEPENDENT_AMBULATORY_CARE_PROVIDER_SITE_OTHER): Payer: BC Managed Care – PPO | Admitting: *Deleted

## 2021-03-15 ENCOUNTER — Encounter: Payer: Self-pay | Admitting: *Deleted

## 2021-03-15 ENCOUNTER — Other Ambulatory Visit: Payer: Self-pay

## 2021-03-15 VITALS — BP 108/65 | HR 79 | Ht 64.0 in | Wt 143.0 lb

## 2021-03-15 DIAGNOSIS — R300 Dysuria: Secondary | ICD-10-CM

## 2021-03-15 MED ORDER — LIDOCAINE HCL 2 % IJ SOLN
20.0000 mL | Freq: Once | INTRAMUSCULAR | Status: AC
  Administered 2021-03-15: 400 mg

## 2021-03-15 MED ORDER — HEPARIN SODIUM (PORCINE) 10000 UNIT/ML IJ SOLN
10000.0000 [IU] | Freq: Once | INTRAMUSCULAR | Status: AC
  Administered 2021-03-15: 10000 [IU] via INTRAVESICAL

## 2021-03-15 MED ORDER — SODIUM BICARBONATE 8.4 % IV SOLN: 5.0000 mL | Freq: Once | INTRAVENOUS | Status: DC

## 2021-03-15 MED ORDER — BUPIVACAINE HCL (PF) 0.25 % IJ SOLN: 20.0000 mL | Freq: Once | INTRAMUSCULAR | Status: DC

## 2021-03-15 MED ORDER — SODIUM BICARBONATE 8.4 % IV SOLN
5.0000 mL | Freq: Once | INTRAVENOUS | Status: AC
  Administered 2021-03-15: 5 mL

## 2021-03-15 MED ORDER — LIDOCAINE HCL 2 % IJ SOLN: 20.0000 mL | Freq: Once | INTRAMUSCULAR | Status: DC

## 2021-03-15 MED ORDER — HEPARIN SODIUM (PORCINE) 10000 UNIT/ML IJ SOLN: 10000.0000 [IU] | Freq: Once | INTRAMUSCULAR | Status: DC

## 2021-03-15 MED ORDER — BUPIVACAINE HCL (PF) 0.25 % IJ SOLN
20.0000 mL | Freq: Once | INTRAMUSCULAR | Status: AC
  Administered 2021-03-15: 20 mL

## 2021-03-15 NOTE — Progress Notes (Signed)
Pt here for bladder instillation. Tolerated well. No complaints.

## 2021-03-22 ENCOUNTER — Other Ambulatory Visit: Payer: Self-pay

## 2021-03-22 ENCOUNTER — Ambulatory Visit (INDEPENDENT_AMBULATORY_CARE_PROVIDER_SITE_OTHER): Payer: BC Managed Care – PPO | Admitting: *Deleted

## 2021-03-22 VITALS — BP 132/75 | HR 69 | Wt 143.0 lb

## 2021-03-22 DIAGNOSIS — R300 Dysuria: Secondary | ICD-10-CM

## 2021-03-22 MED ORDER — BUPIVACAINE HCL (PF) 0.25 % IJ SOLN
20.0000 mL | Freq: Once | INTRAMUSCULAR | Status: AC
  Administered 2021-03-22: 20 mL

## 2021-03-22 MED ORDER — HEPARIN SODIUM (PORCINE) 10000 UNIT/ML IJ SOLN
10000.0000 [IU] | Freq: Once | INTRAMUSCULAR | Status: AC
  Administered 2021-03-22: 10000 [IU] via INTRAVESICAL

## 2021-03-22 MED ORDER — SODIUM BICARBONATE 8.4 % IV SOLN
5.0000 mL | Freq: Once | INTRAVENOUS | Status: AC
  Administered 2021-03-22: 5 mL

## 2021-03-22 MED ORDER — LIDOCAINE HCL 2 % IJ SOLN
20.0000 mL | Freq: Once | INTRAMUSCULAR | Status: AC
  Administered 2021-03-22: 400 mg

## 2021-03-22 NOTE — Progress Notes (Signed)
Pt here for bladder instillation. Tolerated well.

## 2021-03-28 ENCOUNTER — Ambulatory Visit: Payer: BC Managed Care – PPO

## 2021-03-28 NOTE — Progress Notes (Signed)
Office Visit Note  Patient: [name]             Date of Birth: 05-20-1956           MRN: [id number]             PCP: Huel Cote, NP (Inactive) Referring: Huel Cote, NP Visit Date: 04/11/2021 Occupation: @GUAROCC @  Subjective:  Other (Patient reports increased morning stiffness )   History of Present Illness: [name] is a 65 y.o. female with history of osteoarthritis.  She states recently she has been having some stiffness in her lower back.  The symptoms improve after some time.  She is off-and-on discomfort in her lower back.  None of the other joints are painful or swollen.  She has some stiffness in her hands and her knees but no increased pain.  Her psoriasis is under control.  She has not noticed any joint swelling.  Activities of Daily Living:  Patient reports morning stiffness for 5  minutes.   Patient Denies nocturnal pain.  Difficulty dressing/grooming: Denies Difficulty climbing stairs: Denies Difficulty getting out of chair: Denies Difficulty using hands for taps, buttons, cutlery, and/or writing: Denies  Review of Systems  Constitutional: Negative for fatigue.  HENT: Positive for mouth dryness. Negative for mouth sores and nose dryness.   Eyes: Negative for pain, itching and dryness.  Respiratory: Negative for shortness of breath and difficulty breathing.   Cardiovascular: Negative for chest pain and palpitations.  Gastrointestinal: Negative for blood in stool, constipation and diarrhea.  Endocrine: Negative for increased urination.  Genitourinary: Positive for difficulty urinating.       Followed by urology.   Musculoskeletal: Positive for morning stiffness. Negative for arthralgias, joint pain, joint swelling, myalgias, muscle tenderness and myalgias.  Skin: Negative for color change, rash and redness.  Allergic/Immunologic: Negative for susceptible to infections.  Neurological: Negative for dizziness, numbness, headaches, memory loss and  weakness.  Hematological: Negative for bruising/bleeding tendency.  Psychiatric/Behavioral: Negative for confusion and sleep disturbance.    PMFS History:  Patient Active Problem List   Diagnosis Date Noted  . Psoriasis 04/03/2018  . Postablative hypothyroidism 01/30/2017  . Reactive arthritis ([place]) 11/01/2016  . Primary osteoarthritis of both knees 11/01/2016  . Primary osteoarthritis of both hands 11/01/2016  . Primary osteoarthritis of both feet 11/01/2016  . Leukocytosis   . Hyperlipidemia 05/24/2015  . C. difficile colitis 05/24/2015  . Hypokalemia 05/24/2015  . Hyperglycemia 05/24/2015  . Pain of right thigh 10/20/2014  . Other iron deficiency anemias 08/05/2014  . Palpitations 08/21/2013  . Personal history of endometriosis 02/04/2013  . Fibroid uterus 02/04/2013  . Varicose veins of lower extremities with other complications 50/35/4656  . Varicosities of leg 03/10/2012  . Multiple thyroid nodules 01/22/2012    Past Medical History:  Diagnosis Date  . Arrhythmia 2013   PVCs,-monitor  . Arthritis   . Colon polyps 2008   BENIGN  . Depression    PMDD  . Hyperlipidemia   . IBS (irritable bowel syndrome)   . Kidney infection   . Osteopenia 08/2018   T score -1.5 FRAX 7.3% / 0.4%  . Palpitations   . Postablative hypothyroidism   . Raynaud's phenomenon   . Rotator cuff disorder    TEAR-    Family History  Problem Relation Age of Onset  . Hypertension Father   . Lung cancer Father   . Brain cancer Father   . Hypertension Mother   . Heart  attack Mother 74  . Osteoporosis Mother   . Obesity Brother   . Obesity Brother   . Heart attack Maternal Grandfather   . Migraines Daughter    Past Surgical History:  Procedure Laterality Date  . DOPPLER ECHOCARDIOGRAPHY  08/14/2008   EF => 55% , no valvular pathology  . EVENT MONITOR  08/15/2012-08/28/2012   NSR with PVC's ,PSVT vs Aflutter WITH 2:1  . HAND SURGERY Left   . NM MYOCAR PERF WALL MOTION  09/04/2012    EF 66% ,LV normal, exercise capcity 11 mets -completeing stae 3 and 1 minute into stage 4,developed some mild chest pain and throat  tightness caused to stop exercise. [facility] TM scor  2 - MOD RISK  . OOPHORECTOMY  2010   LAP RSO  . right heel surgery Right 04/2018  . ROTATOR CUFF REPAIR    . THYROID LOBECTOMY     RIGHT   Social History   Social History Narrative  . Not on file   Immunization History  Administered Date(s) Administered  . Influenza,inj,Quad PF,6+ Mos 12/27/2016, 08/10/2019, 10/07/2020  . PFIZER(Purple Top)SARS-COV-2 Vaccination 02/06/2020, 02/27/2020  . Tdap 01/23/2013     Objective: Vital Signs: BP 105/65 (BP Location: Left Arm, Patient Position: Sitting, Cuff Size: Normal)   Pulse 77   Resp 13   Ht 5\' 4"  (1.626 m)   Wt 147 lb (66.7 kg)   LMP 03/22/2015   BMI 25.23 kg/m    Physical Exam Vitals and nursing note reviewed.  Constitutional:      Appearance: She is well-developed.  HENT:     Head: Normocephalic and atraumatic.  Eyes:     Conjunctiva/sclera: Conjunctivae normal.  Cardiovascular:     Rate and Rhythm: Normal rate and regular rhythm.     Heart sounds: Normal heart sounds.  Pulmonary:     Effort: Pulmonary effort is normal.     Breath sounds: Normal breath sounds.  Abdominal:     General: Bowel sounds are normal.     Palpations: Abdomen is soft.  Musculoskeletal:     Cervical back: Normal range of motion.  Lymphadenopathy:     Cervical: No cervical adenopathy.  Skin:    General: Skin is warm and dry.     Capillary Refill: Capillary refill takes less than 2 seconds.  Neurological:     Mental Status: She is alert and oriented to person, place, and time.  Psychiatric:        Behavior: Behavior normal.      Musculoskeletal Exam: C-spine was in good range of motion.  She gets mild lumbar scoliosis.  No point tenderness was noted.  There was no SI joint tenderness.  Shoulder joints, elbow joints, wrist joints, MCPs PIPs and DIPs with good  range of motion with no synovitis.  She has bilateral PIP and DIP thickening.  Mucinous cyst on the left first DIP was noted.  Hip joints, knee joints with good range of motion.  She had no tenderness over ankles or MTPs.  CDAI Exam: CDAI Score: -- Patient Global: --; Provider Global: -- Swollen: --; Tender: -- Joint Exam 04/11/2021   No joint exam has been documented for this visit   There is currently no information documented on the homunculus. Go to the Rheumatology activity and complete the homunculus joint exam.  Investigation: No additional findings.  Imaging: XR Lumbar Spine 2-3 Views  Result Date: 04/11/2021 Levoscoliosis was noted.  Multilevel mild spondylosis was noted.  L2-L3 mild spondylolisthesis was noted. Facet  joint arthropathy was noted. Impression: These findings are consistent with mild spondylosis and facet joint arthropathy.   Recent Labs: Lab Results  Component Value Date   WBC 6.5 06/14/2020   HGB 13.0 06/14/2020   PLT 337 06/14/2020   NA 140 06/14/2020   K 4.2 06/14/2020   CL 99 06/14/2020   CO2 29 06/14/2020   GLUCOSE 81 06/14/2020   BUN 14 06/14/2020   CREATININE 0.82 06/14/2020   BILITOT 0.5 06/14/2020   ALKPHOS 57 06/17/2017   AST 22 06/14/2020   ALT 23 06/14/2020   PROT 7.1 06/14/2020   ALBUMIN 3.9 06/17/2017   CALCIUM 10.2 06/14/2020   GFRAA >60 05/25/2015    Speciality Comments: No specialty comments available.  Procedures:  No procedures performed Allergies: Lomotil [diphenoxylate]   Assessment / Plan:     Visit Diagnoses: Primary osteoarthritis of both hands-she has osteoarthritis in her bilateral hands.  She is currently not having any increased pain.  Joint protection was discussed.  Digital mucinous cyst of finger of left hand-she plans to have surgery in the future.  Primary osteoarthritis of both knees-she denies any discomfort.  She is very active.  She plays tennis and golf.  Primary osteoarthritis of both feet-she  denies discomfort.  Chronic midline low back pain without sciatica-she has been having intermittent lower back pain and morning stiffness.  X-ray of the lumbar spine 2 views were obtained today.  X-rays were reviewed with the patient.  Which showed mild levoscoliosis, mild degenerative disc disease and facet joint arthropathy.  I will refer her to physical therapy.  A handout on back exercises was given.  Psoriasis-she has mild psoriasis which is currently not active.  She has no component of inflammatory arthritis.  Raynaud's disease without gangrene-she has symptoms during the winter months.  She has no other clinical features of autoimmune disease.  She denies any history of oral ulcers, nasal ulcers, malar rash, photosensitivity, sicca symptoms or lymphadenopathy.  History of hypertension-blood pressure is normal today.  History of attention deficit disorder  History of hypothyroidism  History of hyperlipidemia  Orders: Orders Placed This Encounter  Procedures  . XR Lumbar Spine 2-3 Views  . Ambulatory referral to Physical Therapy   No orders of the defined types were placed in this encounter.    Follow-Up Instructions: Return in about 1 year (around 04/11/2022) for Osteoarthritis.   Bo Merino, MD  Note - This record has been created using Editor, commissioning.  Chart creation errors have been sought, but may not always  have been located. Such creation errors do not reflect on  the standard of medical care.

## 2021-03-29 ENCOUNTER — Ambulatory Visit: Payer: BC Managed Care – PPO

## 2021-04-05 ENCOUNTER — Encounter (HOSPITAL_BASED_OUTPATIENT_CLINIC_OR_DEPARTMENT_OTHER): Payer: Self-pay | Admitting: *Deleted

## 2021-04-05 ENCOUNTER — Ambulatory Visit (INDEPENDENT_AMBULATORY_CARE_PROVIDER_SITE_OTHER): Payer: BC Managed Care – PPO | Admitting: *Deleted

## 2021-04-05 ENCOUNTER — Other Ambulatory Visit: Payer: Self-pay

## 2021-04-05 VITALS — BP 111/67 | HR 68 | Wt 143.0 lb

## 2021-04-05 DIAGNOSIS — R300 Dysuria: Secondary | ICD-10-CM | POA: Diagnosis not present

## 2021-04-05 MED ORDER — SODIUM BICARBONATE 8.4 % IV SOLN
5.0000 mL | Freq: Once | INTRAVENOUS | Status: AC
  Administered 2021-04-05: 5 mL

## 2021-04-05 MED ORDER — BUPIVACAINE HCL (PF) 0.25 % IJ SOLN
20.0000 mL | Freq: Once | INTRAMUSCULAR | Status: AC
  Administered 2021-04-05: 20 mL

## 2021-04-05 MED ORDER — LIDOCAINE HCL 2 % IJ SOLN
20.0000 mL | Freq: Once | INTRAMUSCULAR | Status: AC
  Administered 2021-04-05: 400 mg

## 2021-04-05 MED ORDER — HEPARIN SODIUM (PORCINE) 10000 UNIT/ML IJ SOLN
10000.0000 [IU] | Freq: Once | INTRAMUSCULAR | Status: AC
  Administered 2021-04-05: 10000 [IU] via INTRAVESICAL

## 2021-04-05 NOTE — Progress Notes (Signed)
Pt here for bladder instillation. Tolerated well.  

## 2021-04-11 ENCOUNTER — Ambulatory Visit (INDEPENDENT_AMBULATORY_CARE_PROVIDER_SITE_OTHER): Payer: BC Managed Care – PPO | Admitting: Rheumatology

## 2021-04-11 ENCOUNTER — Encounter: Payer: Self-pay | Admitting: Rheumatology

## 2021-04-11 ENCOUNTER — Ambulatory Visit: Payer: Self-pay

## 2021-04-11 ENCOUNTER — Other Ambulatory Visit: Payer: Self-pay

## 2021-04-11 VITALS — BP 105/65 | HR 77 | Resp 13 | Ht 64.0 in | Wt 147.0 lb

## 2021-04-11 DIAGNOSIS — M67442 Ganglion, left hand: Secondary | ICD-10-CM | POA: Diagnosis not present

## 2021-04-11 DIAGNOSIS — M4186 Other forms of scoliosis, lumbar region: Secondary | ICD-10-CM

## 2021-04-11 DIAGNOSIS — M19071 Primary osteoarthritis, right ankle and foot: Secondary | ICD-10-CM

## 2021-04-11 DIAGNOSIS — M7062 Trochanteric bursitis, left hip: Secondary | ICD-10-CM

## 2021-04-11 DIAGNOSIS — Z8639 Personal history of other endocrine, nutritional and metabolic disease: Secondary | ICD-10-CM

## 2021-04-11 DIAGNOSIS — M170 Bilateral primary osteoarthritis of knee: Secondary | ICD-10-CM | POA: Diagnosis not present

## 2021-04-11 DIAGNOSIS — G8929 Other chronic pain: Secondary | ICD-10-CM | POA: Diagnosis not present

## 2021-04-11 DIAGNOSIS — M5459 Other low back pain: Secondary | ICD-10-CM | POA: Diagnosis not present

## 2021-04-11 DIAGNOSIS — M19041 Primary osteoarthritis, right hand: Secondary | ICD-10-CM | POA: Diagnosis not present

## 2021-04-11 DIAGNOSIS — M19072 Primary osteoarthritis, left ankle and foot: Secondary | ICD-10-CM

## 2021-04-11 DIAGNOSIS — M545 Low back pain, unspecified: Secondary | ICD-10-CM

## 2021-04-11 DIAGNOSIS — Z8659 Personal history of other mental and behavioral disorders: Secondary | ICD-10-CM

## 2021-04-11 DIAGNOSIS — L298 Other pruritus: Secondary | ICD-10-CM

## 2021-04-11 DIAGNOSIS — M19042 Primary osteoarthritis, left hand: Secondary | ICD-10-CM

## 2021-04-11 DIAGNOSIS — I7300 Raynaud's syndrome without gangrene: Secondary | ICD-10-CM

## 2021-04-11 DIAGNOSIS — Z8679 Personal history of other diseases of the circulatory system: Secondary | ICD-10-CM

## 2021-04-11 DIAGNOSIS — L409 Psoriasis, unspecified: Secondary | ICD-10-CM

## 2021-04-11 NOTE — Patient Instructions (Signed)

## 2021-04-12 ENCOUNTER — Ambulatory Visit (INDEPENDENT_AMBULATORY_CARE_PROVIDER_SITE_OTHER): Payer: BC Managed Care – PPO | Admitting: *Deleted

## 2021-04-12 ENCOUNTER — Encounter (HOSPITAL_BASED_OUTPATIENT_CLINIC_OR_DEPARTMENT_OTHER): Payer: Self-pay | Admitting: *Deleted

## 2021-04-12 VITALS — BP 106/62 | HR 65 | Wt 147.0 lb

## 2021-04-12 DIAGNOSIS — R300 Dysuria: Secondary | ICD-10-CM

## 2021-04-12 MED ORDER — SODIUM BICARBONATE 8.4 % IV SOLN
5.0000 mL | Freq: Once | INTRAVENOUS | Status: AC
  Administered 2021-04-12: 5 mL

## 2021-04-12 MED ORDER — HEPARIN SODIUM (PORCINE) 10000 UNIT/ML IJ SOLN
10000.0000 [IU] | Freq: Once | INTRAMUSCULAR | Status: AC
  Administered 2021-04-12: 10000 [IU] via INTRAVESICAL

## 2021-04-12 MED ORDER — BUPIVACAINE HCL (PF) 0.25 % IJ SOLN
20.0000 mL | Freq: Once | INTRAMUSCULAR | Status: AC
  Administered 2021-04-12: 20 mL

## 2021-04-12 MED ORDER — LIDOCAINE HCL 2 % IJ SOLN
20.0000 mL | Freq: Once | INTRAMUSCULAR | Status: AC
  Administered 2021-04-12: 400 mg

## 2021-04-12 NOTE — Progress Notes (Signed)
Pt here for bladder instillation. 12 Fr catheter used and 89ml of pale yellow urine obtained.  Pt tolerated instillation well. Pt to call for any problems or concerns and when she feels that she needs a follow up appt.

## 2021-04-17 ENCOUNTER — Telehealth: Payer: Self-pay

## 2021-04-17 NOTE — Telephone Encounter (Signed)
Otila Kluver from [facility] left a voicemail stating  they have not received a referral for the patient. Please fax to [phone]  Or you can call back at [phone]

## 2021-05-02 DIAGNOSIS — M545 Low back pain, unspecified: Secondary | ICD-10-CM | POA: Diagnosis not present

## 2021-05-02 DIAGNOSIS — M25552 Pain in left hip: Secondary | ICD-10-CM | POA: Diagnosis not present

## 2021-05-09 DIAGNOSIS — M545 Low back pain, unspecified: Secondary | ICD-10-CM | POA: Diagnosis not present

## 2021-05-09 DIAGNOSIS — M25552 Pain in left hip: Secondary | ICD-10-CM | POA: Diagnosis not present

## 2021-05-31 DIAGNOSIS — M545 Low back pain, unspecified: Secondary | ICD-10-CM | POA: Diagnosis not present

## 2021-05-31 DIAGNOSIS — M25552 Pain in left hip: Secondary | ICD-10-CM | POA: Diagnosis not present

## 2021-06-14 ENCOUNTER — Other Ambulatory Visit: Payer: Self-pay

## 2021-06-14 DIAGNOSIS — D1722 Benign lipomatous neoplasm of skin and subcutaneous tissue of left arm: Secondary | ICD-10-CM | POA: Diagnosis not present

## 2021-06-19 DIAGNOSIS — M25552 Pain in left hip: Secondary | ICD-10-CM | POA: Diagnosis not present

## 2021-06-19 DIAGNOSIS — M545 Low back pain, unspecified: Secondary | ICD-10-CM | POA: Diagnosis not present

## 2021-07-06 DIAGNOSIS — M545 Low back pain, unspecified: Secondary | ICD-10-CM | POA: Diagnosis not present

## 2021-07-06 DIAGNOSIS — M25552 Pain in left hip: Secondary | ICD-10-CM | POA: Diagnosis not present

## 2021-07-20 DIAGNOSIS — M25552 Pain in left hip: Secondary | ICD-10-CM | POA: Diagnosis not present

## 2021-07-20 DIAGNOSIS — M545 Low back pain, unspecified: Secondary | ICD-10-CM | POA: Diagnosis not present

## 2021-07-27 HISTORY — PX: FINGER SURGERY: SHX640

## 2021-08-18 ENCOUNTER — Other Ambulatory Visit: Payer: Self-pay | Admitting: Nurse Practitioner

## 2021-08-18 NOTE — Telephone Encounter (Signed)
RX refill Estradiol Last annual 06/14/2020 over due I will route to Provider for approval

## 2021-08-21 ENCOUNTER — Other Ambulatory Visit: Payer: Self-pay | Admitting: *Deleted

## 2021-08-21 DIAGNOSIS — R300 Dysuria: Secondary | ICD-10-CM

## 2021-08-21 NOTE — Telephone Encounter (Signed)
Patient requesting Estrace vaginal cream.  Last annual exam 06/14/2020 DR. Kendall Last OV 10/07/2020 I will route to Provider for approval

## 2021-08-21 NOTE — Telephone Encounter (Signed)
Looks like the vaginal tablets were discontinued and she is now using the vaginal cream prescribed by Dr. Wannetta Sender. Can we verify?

## 2021-08-22 NOTE — Telephone Encounter (Signed)
Pt aware of denial and will reach out to Dr. Wannetta Sender office for approval

## 2021-08-22 NOTE — Telephone Encounter (Signed)
Called patient. Mail box full unable to leave message. I will try again later. See message below:  Ashlee Gammon, NP  You 2 hours ago (7:45 AM)   This was prescribed my Dr. Wannetta Sender. I will decline and it should be requested by her office. Thank you.

## 2021-09-09 ENCOUNTER — Other Ambulatory Visit: Payer: Self-pay | Admitting: Obstetrics and Gynecology

## 2021-09-09 DIAGNOSIS — R300 Dysuria: Secondary | ICD-10-CM

## 2021-10-12 ENCOUNTER — Ambulatory Visit: Payer: BC Managed Care – PPO | Admitting: Internal Medicine
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
## 2021-10-24 DIAGNOSIS — Z1231 Encounter for screening mammogram for malignant neoplasm of breast: Secondary | ICD-10-CM | POA: Diagnosis not present

## 2021-10-27 ENCOUNTER — Encounter: Payer: Self-pay | Admitting: Nurse Practitioner

## 2021-11-17 ENCOUNTER — Telehealth: Payer: Self-pay | Admitting: *Deleted

## 2021-11-17 NOTE — Telephone Encounter (Signed)
Patient called requesting Rx Zovirax cream for HSV 1.  Rx has expired.

## 2021-11-21 ENCOUNTER — Other Ambulatory Visit: Payer: Self-pay | Admitting: Nurse Practitioner

## 2021-11-21 DIAGNOSIS — B009 Herpesviral infection, unspecified: Secondary | ICD-10-CM

## 2021-11-21 MED ORDER — ACYCLOVIR 5 % EX OINT: 1.0000 "application " | TOPICAL_OINTMENT | Freq: Two times a day (BID) | CUTANEOUS | 1 refills | Status: DC

## 2021-11-21 NOTE — Telephone Encounter (Signed)
Prescription sent

## 2021-12-14 ENCOUNTER — Encounter: Payer: Self-pay | Admitting: Internal Medicine

## 2021-12-14 ENCOUNTER — Other Ambulatory Visit: Payer: Self-pay

## 2021-12-14 ENCOUNTER — Ambulatory Visit (INDEPENDENT_AMBULATORY_CARE_PROVIDER_SITE_OTHER): Payer: BC Managed Care – PPO | Admitting: Internal Medicine

## 2021-12-14 VITALS — BP 110/78 | HR 62 | Ht 64.0 in | Wt 129.2 lb

## 2021-12-14 DIAGNOSIS — E890 Postprocedural hypothyroidism: Secondary | ICD-10-CM | POA: Diagnosis not present

## 2021-12-14 DIAGNOSIS — E042 Nontoxic multinodular goiter: Secondary | ICD-10-CM | POA: Diagnosis not present

## 2021-12-14 LAB — T3, FREE: T3, Free: 4 pg/mL (ref 2.3–4.2)

## 2021-12-14 LAB — TSH: TSH: 3.01 u[IU]/mL (ref 0.35–5.50)

## 2021-12-14 LAB — T4, FREE: Free T4: 0.56 ng/dL — ABNORMAL LOW (ref 0.60–1.60)

## 2021-12-14 MED ORDER — ARMOUR THYROID 60 MG PO TABS: ORAL_TABLET | ORAL | 3 refills | Status: DC

## 2021-12-14 MED ORDER — ARMOUR THYROID 90 MG PO TABS: ORAL_TABLET | ORAL | 3 refills | Status: DC

## 2021-12-14 NOTE — Patient Instructions (Signed)
Please continue: - Armour 90 mg 1/7 days and 60 mg 6/7 days  Take the thyroid hormone every day, with water, at least 30 minutes before breakfast, separated by at least 4 hours from: - acid reflux medications - calcium - iron - multivitamins  Please stop at the lab.  Please return 1 year for an appt.
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
## 2021-12-19 ENCOUNTER — Other Ambulatory Visit: Payer: Self-pay | Admitting: Internal Medicine

## 2022-01-08 DIAGNOSIS — M79641 Pain in right hand: Secondary | ICD-10-CM | POA: Diagnosis not present

## 2022-01-30 ENCOUNTER — Encounter: Payer: Self-pay | Admitting: Nurse Practitioner

## 2022-01-30 ENCOUNTER — Other Ambulatory Visit: Payer: Self-pay

## 2022-01-30 ENCOUNTER — Ambulatory Visit (INDEPENDENT_AMBULATORY_CARE_PROVIDER_SITE_OTHER): Payer: BC Managed Care – PPO | Admitting: Nurse Practitioner

## 2022-01-30 VITALS — BP 118/78

## 2022-01-30 DIAGNOSIS — L659 Nonscarring hair loss, unspecified: Secondary | ICD-10-CM | POA: Diagnosis not present

## 2022-01-30 NOTE — Progress Notes (Signed)
? ?  Acute Office Visit ? ?Subjective:  ? ? Patient ID: [name], female    DOB: 05-07-1956, 66 y.o.   MRN: [id number] ? ? ?HPI ?66 y.o. presents today to discuss hair loss. She has lost 20-30 pounds on Keto diet and noticed thinning hair and increased hair loss in shower. She plans to lose a few more pounds. She is wondering if it is her hormones. History of hypothyroidism, normal thyroid panel 11/2021. She recently started biotin supplement and biotic shampoo. Takes Vitamin D, Vitamin C, B complex, zinc, and tumeric supplements. No change in hair products prior.  ? ? ?Review of Systems  ?Constitutional: Negative.   ?Skin:   ?     Thinning hair  ? ?   ?Objective:  ?  ?Physical Exam ?Constitutional:   ?   Appearance: Normal appearance.  ?Scalp: Generalized thinning, no skin changes ? ?BP 118/78   LMP 03/22/2015  ?Wt Readings from Last 3 Encounters:  ?12/14/21 129 lb 3.2 oz (58.6 kg)  ?04/12/21 147 lb (66.7 kg)  ?04/11/21 147 lb (66.7 kg)  ? ? ?   ? ?Patient informed chaperone available to be present for breast and pelvic exam. Patient has requested no chaperone to be present. Patient has been advised what will be completed during breast and pelvic exam.  ? ?Assessment & Plan:  ? ?Problem List Items Addressed This Visit   ?None ?Visit Diagnoses   ? ? Thinning hair    -  Primary  ? ?  ? ?Plan: Weight loss likely cause for hair loss. Recommend increasing protein intake, continue biotin supplement and add hyaluronic acid, continue biotic shampoo, and apply rosemary oil to scalp after each wash. Discussed that new hair growth can take 6-12 months to become apparent. If no improvement in 6 months dermatology referral recommended. She is agreeable to plan.  ? ? ? ? ?Tamela Gammon DNP, 9:24 AM 01/30/2022 ? ?

## 2022-02-20 ENCOUNTER — Other Ambulatory Visit: Payer: Self-pay | Admitting: Obstetrics and Gynecology

## 2022-02-20 DIAGNOSIS — R300 Dysuria: Secondary | ICD-10-CM

## 2022-03-06 ENCOUNTER — Ambulatory Visit (INDEPENDENT_AMBULATORY_CARE_PROVIDER_SITE_OTHER): Payer: BC Managed Care – PPO | Admitting: Nurse Practitioner

## 2022-03-06 ENCOUNTER — Encounter: Payer: Self-pay | Admitting: Nurse Practitioner

## 2022-03-06 VITALS — BP 100/78 | Ht 64.0 in | Wt 134.0 lb

## 2022-03-06 DIAGNOSIS — N951 Menopausal and female climacteric states: Secondary | ICD-10-CM

## 2022-03-06 DIAGNOSIS — B009 Herpesviral infection, unspecified: Secondary | ICD-10-CM | POA: Diagnosis not present

## 2022-03-06 DIAGNOSIS — N898 Other specified noninflammatory disorders of vagina: Secondary | ICD-10-CM

## 2022-03-06 DIAGNOSIS — M8589 Other specified disorders of bone density and structure, multiple sites: Secondary | ICD-10-CM | POA: Diagnosis not present

## 2022-03-06 DIAGNOSIS — Z01419 Encounter for gynecological examination (general) (routine) without abnormal findings: Secondary | ICD-10-CM

## 2022-03-06 DIAGNOSIS — Z8262 Family history of osteoporosis: Secondary | ICD-10-CM

## 2022-03-06 DIAGNOSIS — E785 Hyperlipidemia, unspecified: Secondary | ICD-10-CM

## 2022-03-06 LAB — WET PREP FOR TRICH, YEAST, CLUE

## 2022-03-06 MED ORDER — VALACYCLOVIR HCL 1 G PO TABS: 1000.0000 mg | ORAL_TABLET | Freq: Every day | ORAL | 1 refills | Status: DC

## 2022-03-06 MED ORDER — ESTRADIOL 0.1 MG/GM VA CREA: 0.5000 g | TOPICAL_CREAM | VAGINAL | 3 refills | Status: DC

## 2022-03-06 MED ORDER — HYDROXYZINE HCL 25 MG PO TABS: 25.0000 mg | ORAL_TABLET | Freq: Every evening | ORAL | 3 refills | Status: DC

## 2022-03-06 NOTE — Progress Notes (Signed)
? ?[name] 1956/09/21 [id number] ? ? ?History:  66 y.o. G2P2002 presents for annual exam. Postmenopausal - vaginal estrogen cream for dryness/atrophy. Cream and hydroxyzine initially prescribed by urogynecology. She would like to continue use. She has noticed vaginal discharge without itching or odor the last couple of days.  HSV-1, Valtrex as needed for outbreaks. Hypothyroidism managed by endocrinology. 2010 RSO for cyst.  ? ?Gynecologic History ?Patient's last menstrual period was 03/22/2015. ?  ?Contraception: post menopausal status ?Sexually active: Yes ? ?Health maintenance ?Last Pap: 06/14/2020. Results were: Normal, 5-year repeat ?Last mammogram: 10/27/2021. Results were: Normal ?Last colonoscopy: 11/10/2018. Results were: Polyps, 5-year follow up ?Last Dexa: 08/27/2018. Results were: T-score -1.1 FRAX 7.3% / 0.4% ? ?Past medical history, past surgical history, family history and social history were all reviewed and documented in the EPIC chart. 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 Mother with osteoporosis.  ? ?ROS:  A ROS was performed and pertinent positives and negatives are included. ? ?Exam: ? ?Vitals:  ? 03/06/22 1512  ?BP: 100/78  ?Weight: 134 lb (60.8 kg)  ?Height: '5\' 4"'$  (1.626 m)  ? ? ?Body mass index is 23 kg/m?. ? ?General appearance:  Normal ?Thyroid:  Symmetrical, normal in size, without palpable masses or nodularity. ?Respiratory ? Auscultation:  Clear without wheezing or rhonchi ?Cardiovascular ? Auscultation:  Regular rate, without rubs, murmurs or gallops ? Edema/varicosities:  Not grossly evident ?Abdominal ? Soft,nontender, without masses, guarding or rebound. ? Liver/spleen:  No organomegaly noted ? Hernia:  None appreciated ? Skin ? Inspection:  Grossly normal ?  ?Breasts: Examined lying and sitting.  ? Right: Without masses, retractions, discharge or axillary adenopathy. ? ? Left: Without masses, retractions, discharge or axillary  adenopathy. ?Gentitourinary  ? Inguinal/mons:  Normal without inguinal adenopathy ? External genitalia:  Normal discharge, no lesions ? BUS/Urethra/Skene's glands:  Normal ? Vagina:  Atrophic changes ? Cervix:  Normal ? Uterus:  Normal in size, shape and contour.  Midline and mobile ? Adnexa/parametria:   ?  Rt: Without masses or tenderness. ?  Lt: Without masses or tenderness. ? Anus and perineum: Normal ? Digital rectal exam: Normal sphincter tone without palpated masses or tenderness ? ?Patient informed chaperone available to be present for breast and pelvic exam. Patient has requested no chaperone to be present. Patient has been advised what will be completed during breast and pelvic exam.  ? ?Assessment/Plan:  66 y.o. [id number] for annual exam. ? ?Well female exam with routine gynecological exam - Plan: CBC with Differential/Platelet, Comprehensive metabolic panel. Education provided on SBEs, importance of preventative screenings, current guidelines, high calcium diet, regular exercise, and multivitamin daily. She will return for fasting labs.  ? ?Menopausal vaginal dryness - Plan: estradiol (ESTRACE) 0.1 MG/GM vaginal cream twice weekly, hydrOXYzine (ATARAX) 25 MG tablet as needed - she was initially provided this by Urogynecology for itching/irritation that affected her sleep. She wants to continue.  ? ?HSV-1 infection - Plan: valACYclovir (VALTREX) 1000 MG tablet as needed for outbreaks. Occasional outbreaks.  ? ?Osteopenia of multiple sites - Plan: DG Bone Density. T-score -1.1 wihtout elevated FRAX. Will schedule DXA now. Continue Vitamin D supplement, high calcium diet, and regular exercise. She is very active with pickle ball and golf.  ? ?Family history of osteoporosis in mother - Plan: DG Bone Density ? ?Vaginal discharge - Plan: [place], CLUE. Negative wet prep and exam.  ? ?Hyperlipidemia, unspecified hyperlipidemia type - Plan: Lipid panel ? ?Screening  for cervical cancer - 2018  ASCUS neg HR HPV, otherwise normal pap history. Will repeat at 5-year interval per guidelines. ? ?Screening for breast cancer - Normal mammogram history.  Continue annual screenings.  Normal breast exam today. ? ?Screening for colon cancer - 2019 colonoscopy. Will repeat at 5-year interval per GI's recommendation.  ? ?Follow up in 1 year for annual. ? ? ? ? ? ?Tamela Gammon [facility], 3:43 PM 03/06/2022 ?

## 2022-03-12 ENCOUNTER — Other Ambulatory Visit: Payer: BC Managed Care – PPO

## 2022-03-12 DIAGNOSIS — Z01419 Encounter for gynecological examination (general) (routine) without abnormal findings: Secondary | ICD-10-CM

## 2022-03-12 DIAGNOSIS — E785 Hyperlipidemia, unspecified: Secondary | ICD-10-CM

## 2022-03-12 LAB — LIPID PANEL
Cholesterol: 220 mg/dL — ABNORMAL HIGH (ref <200)
HDL: 74 mg/dL (ref >=50)
LDL Cholesterol (Calc): 126 mg/dL (calc) — ABNORMAL HIGH
Non-HDL Cholesterol (Calc): 146 mg/dL (calc) — ABNORMAL HIGH (ref <130)
Total CHOL/HDL Ratio: 3 (calc) (ref <5.0)
Triglycerides: 96 mg/dL (ref <150)

## 2022-03-12 LAB — CBC WITH DIFFERENTIAL/PLATELET
Absolute Monocytes: 334 cells/uL (ref 200–950)
Basophils Absolute: 48 cells/uL (ref 0–200)
Basophils Relative: 1.1 %
Eosinophils Absolute: 123 cells/uL (ref 15–500)
Eosinophils Relative: 2.8 %
HCT: 37 % (ref 35.0–45.0)
Hemoglobin: 12.2 g/dL (ref 11.7–15.5)
Lymphs Abs: 1659 cells/uL (ref 850–3900)
MCH: 29.7 pg (ref 27.0–33.0)
MCHC: 33 g/dL (ref 32.0–36.0)
MCV: 90 fL (ref 80.0–100.0)
MPV: 11.2 fL (ref 7.5–12.5)
Monocytes Relative: 7.6 %
Neutro Abs: 2235 cells/uL (ref 1500–7800)
Neutrophils Relative %: 50.8 %
Platelets: 270 10*3/uL (ref 140–400)
RBC: 4.11 10*6/uL (ref 3.80–5.10)
RDW: 12.8 % (ref 11.0–15.0)
Total Lymphocyte: 37.7 %
WBC: 4.4 10*3/uL (ref 3.8–10.8)

## 2022-03-12 LAB — COMPREHENSIVE METABOLIC PANEL
AG Ratio: 1.7 (calc) (ref 1.0–2.5)
ALT: 24 U/L (ref 6–29)
AST: 23 U/L (ref 10–35)
Albumin: 4.4 g/dL (ref 3.6–5.1)
Alkaline phosphatase (APISO): 66 U/L (ref 37–153)
BUN: 15 mg/dL (ref 7–25)
CO2: 31 mmol/L (ref 20–32)
Calcium: 9.4 mg/dL (ref 8.6–10.4)
Chloride: 102 mmol/L (ref 98–110)
Creat: 0.77 mg/dL (ref 0.50–1.05)
Globulin: 2.6 g/dL (calc) (ref 1.9–3.7)
Glucose, Bld: 94 mg/dL (ref 65–99)
Potassium: 4.4 mmol/L (ref 3.5–5.3)
Sodium: 139 mmol/L (ref 135–146)
Total Bilirubin: 0.4 mg/dL (ref 0.2–1.2)
Total Protein: 7 g/dL (ref 6.1–8.1)

## 2022-03-28 NOTE — Progress Notes (Signed)
? ?Office Visit Note ? ?Patient: [name]             ?Date of Birth: February 17, 1956           ?MRN: [id number]             ?PCP: Pcp, No ?Referring: No ref. provider found ?Visit Date: 04/11/2022 ?Occupation: '@GUAROCC'$ @ ? ?Subjective:  ?Right CMC joint pain  ? ?History of Present Illness: [name] is a 66 y.o. female with history of osteoarthritis, psoriasis, and raynaud's.  Patient was last seen in the office on 04/11/2021.  She reports that at her last office visit she was experiencing increased discomfort in her lower back.  She improved her mattress which has also improved her discomfort.  She states several weeks ago she had a flareup in her lower back where it locked up.  Her symptoms have resolved.  She is not experiencing any symptoms of radiculopathy at this time.  She denies any SI joint discomfort.  She continues to have chronic pain in the right thumb.  She is considering proceeding with surgery with Dr. Burney Gauze in fall 2023.  Her left CMC joint was previously operated on and has been doing well.  She has been using Voltaren gel topically as well as magnesium oil topically on her right thumb for symptomatic relief.  She also uses a right CMC joint brace if she is going to be performing overuse activities. ?She denies any joint swelling at this time.  She has not had any symptoms of Raynaud's.  She denies any skin tightness or thickening.  She has not had any digital ulcerations.  She states GI continues to have occasional scattered patches of psoriasis.  She previously used hydrocortisone lotion topically which would resolve these patches.  She requested a refill of hydrocortisone to be sent to the pharmacy. ? ? ? ?Activities of Daily Living:  ?Patient reports morning stiffness for 0 minutes.   ?Patient Denies nocturnal pain.  ?Difficulty dressing/grooming: Denies ?Difficulty climbing stairs: Denies ?Difficulty getting out of chair: Denies ?Difficulty using hands for taps, buttons, cutlery,  and/or writing: Reports ? ?Review of Systems  ?Constitutional:  Negative for fatigue.  ?HENT:  Negative for mouth sores, mouth dryness and nose dryness.   ?Eyes:  Negative for pain, itching and dryness.  ?Respiratory:  Negative for shortness of breath and difficulty breathing.   ?Cardiovascular:  Negative for chest pain and palpitations.  ?Gastrointestinal:  Negative for blood in stool, constipation and diarrhea.  ?Endocrine: Negative for increased urination.  ?Genitourinary:  Negative for difficulty urinating.  ?Musculoskeletal:  Positive for joint pain, joint pain and joint swelling. Negative for myalgias, morning stiffness, muscle tenderness and myalgias.  ?Skin:  Negative for color change, rash and redness.  ?Allergic/Immunologic: Negative for susceptible to infections.  ?Neurological:  Negative for dizziness, numbness, headaches, memory loss and weakness.  ?Hematological:  Negative for bruising/bleeding tendency.  ?Psychiatric/Behavioral:  Negative for confusion.   ? ?PMFS History:  ?Patient Active Problem List  ? Diagnosis Date Noted  ? Psoriasis 04/03/2018  ? Postablative hypothyroidism 01/30/2017  ? Reactive arthritis ([place]) 11/01/2016  ? Primary osteoarthritis of both knees 11/01/2016  ? Primary osteoarthritis of both hands 11/01/2016  ? Primary osteoarthritis of both feet 11/01/2016  ? Leukocytosis   ? Hyperlipidemia 05/24/2015  ? C. difficile colitis 05/24/2015  ? Hypokalemia 05/24/2015  ? Hyperglycemia 05/24/2015  ? Pain of right thigh 10/20/2014  ? Other iron deficiency anemias 08/05/2014  ? Palpitations 08/21/2013  ?  Personal history of endometriosis 02/04/2013  ? Fibroid uterus 02/04/2013  ? Varicose veins of lower extremities with other complications [id number]  ? Varicosities of leg 03/10/2012  ? Multiple thyroid nodules 01/22/2012  ?  ?Past Medical History:  ?Diagnosis Date  ? Arrhythmia 2013  ? PVCs,-monitor  ? Arthritis   ? Colon polyps 2008  ? BENIGN  ? Depression   ? PMDD  ? Hyperlipidemia   ?  IBS (irritable bowel syndrome)   ? Kidney infection   ? Osteopenia 08/2018  ? T score -1.5 FRAX 7.3% / 0.4%  ? Palpitations   ? Postablative hypothyroidism   ? Raynaud's phenomenon   ? Rotator cuff disorder   ? TEAR-  ?  ?Family History  ?Problem Relation Age of Onset  ? Hypertension Father   ? Lung cancer Father   ? Brain cancer Father   ? Hypertension Mother   ? Heart attack Mother 26  ? Osteoporosis Mother   ? Obesity Brother   ? Obesity Brother   ? Heart attack Maternal Grandfather   ? Migraines Daughter   ? ?Past Surgical History:  ?Procedure Laterality Date  ? DOPPLER ECHOCARDIOGRAPHY  08/14/2008  ? EF => 55% , no valvular pathology  ? EVENT MONITOR  08/15/2012-08/28/2012  ? NSR with PVC's ,PSVT vs Aflutter WITH 2:1  ? FINGER SURGERY Left 07/2021  ? cyst removed from left index finger  ? HAND SURGERY Left   ? NM MYOCAR PERF WALL MOTION  09/04/2012  ? EF 66% ,LV normal, exercise capcity 11 mets -completeing stae 3 and 1 minute into stage 4,developed some mild chest pain and throat  tightness caused to stop exercise. [facility] TM scor  2 - MOD RISK  ? OOPHORECTOMY  2010  ? LAP RSO  ? right heel surgery Right 04/2018  ? ROTATOR CUFF REPAIR    ? THYROID LOBECTOMY    ? RIGHT  ? ?Social History  ? ?Social History Narrative  ? Not on file  ? ?Immunization History  ?Administered Date(s) Administered  ? Influenza,inj,Quad PF,6+ Mos 12/27/2016, 08/10/2019, 10/07/2020  ? PFIZER(Purple Top)SARS-COV-2 Vaccination 02/06/2020, 02/27/2020  ? Tdap 01/23/2013  ?  ? ?Objective: ?Vital Signs: BP 107/67 (BP Location: Left Arm, Patient Position: Sitting, Cuff Size: Normal)   Pulse 62   Ht '5\' 4"'$  (1.626 m)   Wt 135 lb (61.2 kg)   LMP 03/22/2015   BMI 23.17 kg/m?   ? ?Physical Exam ?Vitals and nursing note reviewed.  ?Constitutional:   ?   Appearance: She is well-developed.  ?HENT:  ?   Head: Normocephalic and atraumatic.  ?Eyes:  ?   Conjunctiva/sclera: Conjunctivae normal.  ?Cardiovascular:  ?   Rate and Rhythm: Normal rate and  regular rhythm.  ?   Heart sounds: Normal heart sounds.  ?Pulmonary:  ?   Effort: Pulmonary effort is normal.  ?   Breath sounds: Normal breath sounds.  ?Abdominal:  ?   General: Bowel sounds are normal.  ?   Palpations: Abdomen is soft.  ?Musculoskeletal:  ?   Cervical back: Normal range of motion.  ?Skin: ?   General: Skin is warm and dry.  ?   Capillary Refill: Capillary refill takes less than 2 seconds.  ?   Comments: No digital ulcerations or signs of gangrene noted  ?Neurological:  ?   Mental Status: She is alert and oriented to person, place, and time.  ?Psychiatric:     ?   Behavior: Behavior normal.  ?  ? ?  Musculoskeletal Exam: C-spine, thoracic spine, lumbar spine have good range of motion.  No midline spinal tenderness or SI joint tenderness.  Shoulder joints, elbow joints, wrist joints, MCPs, PIPs, DIPs have good range of motion with no synovitis.  Tenderness palpation as well as thickening over the right CMC joint noted.  PIP and DIP thickening consistent with osteoarthritis of both hands.  Complete fist formation bilaterally.  Hip joints have good range of motion with no groin pain.  Knee joints have good range of motion with no warmth or effusion.  Ankle joints have good range of motion with no tenderness or joint swelling.  No evidence of Achilles tendinitis or plantar fasciitis.  No tenderness over MTP joints. ? ?CDAI Exam: ?CDAI Score: -- ?Patient Global: --; Provider Global: -- ?Swollen: --; Tender: -- ?Joint Exam 04/11/2022  ? ?No joint exam has been documented for this visit  ? ?There is currently no information documented on the homunculus. Go to the Rheumatology activity and complete the homunculus joint exam. ? ?Investigation: ?No additional findings. ? ?Imaging: ?No results found. ? ?Recent Labs: ?Lab Results  ?Component Value Date  ? WBC 4.4 03/12/2022  ? HGB 12.2 03/12/2022  ? PLT 270 03/12/2022  ? NA 139 03/12/2022  ? K 4.4 03/12/2022  ? CL 102 03/12/2022  ? CO2 31 03/12/2022  ? GLUCOSE  94 03/12/2022  ? BUN 15 03/12/2022  ? CREATININE 0.77 03/12/2022  ? BILITOT 0.4 03/12/2022  ? ALKPHOS 57 06/17/2017  ? AST 23 03/12/2022  ? ALT 24 03/12/2022  ? PROT 7.0 03/12/2022  ? ALBUMIN 3.9 06/17/2017  ? CALCIU

## 2022-04-03 ENCOUNTER — Ambulatory Visit (INDEPENDENT_AMBULATORY_CARE_PROVIDER_SITE_OTHER): Payer: BC Managed Care – PPO

## 2022-04-03 ENCOUNTER — Other Ambulatory Visit: Payer: Self-pay | Admitting: Nurse Practitioner

## 2022-04-03 DIAGNOSIS — Z780 Asymptomatic menopausal state: Secondary | ICD-10-CM

## 2022-04-03 DIAGNOSIS — Z8262 Family history of osteoporosis: Secondary | ICD-10-CM

## 2022-04-03 DIAGNOSIS — M8589 Other specified disorders of bone density and structure, multiple sites: Secondary | ICD-10-CM

## 2022-04-03 DIAGNOSIS — Z13820 Encounter for screening for osteoporosis: Secondary | ICD-10-CM | POA: Diagnosis not present

## 2022-04-10 ENCOUNTER — Ambulatory Visit: Payer: BC Managed Care – PPO | Admitting: Rheumatology

## 2022-04-11 ENCOUNTER — Ambulatory Visit (INDEPENDENT_AMBULATORY_CARE_PROVIDER_SITE_OTHER): Payer: BC Managed Care – PPO | Admitting: Physician Assistant

## 2022-04-11 ENCOUNTER — Encounter: Payer: Self-pay | Admitting: Physician Assistant

## 2022-04-11 VITALS — BP 107/67 | HR 62 | Ht 64.0 in | Wt 135.0 lb

## 2022-04-11 DIAGNOSIS — Z8659 Personal history of other mental and behavioral disorders: Secondary | ICD-10-CM

## 2022-04-11 DIAGNOSIS — M7062 Trochanteric bursitis, left hip: Secondary | ICD-10-CM

## 2022-04-11 DIAGNOSIS — M19042 Primary osteoarthritis, left hand: Secondary | ICD-10-CM

## 2022-04-11 DIAGNOSIS — M19072 Primary osteoarthritis, left ankle and foot: Secondary | ICD-10-CM

## 2022-04-11 DIAGNOSIS — M170 Bilateral primary osteoarthritis of knee: Secondary | ICD-10-CM

## 2022-04-11 DIAGNOSIS — M19071 Primary osteoarthritis, right ankle and foot: Secondary | ICD-10-CM | POA: Diagnosis not present

## 2022-04-11 DIAGNOSIS — M19041 Primary osteoarthritis, right hand: Secondary | ICD-10-CM

## 2022-04-11 DIAGNOSIS — M7061 Trochanteric bursitis, right hip: Secondary | ICD-10-CM

## 2022-04-11 DIAGNOSIS — M67442 Ganglion, left hand: Secondary | ICD-10-CM | POA: Diagnosis not present

## 2022-04-11 DIAGNOSIS — Z8639 Personal history of other endocrine, nutritional and metabolic disease: Secondary | ICD-10-CM

## 2022-04-11 DIAGNOSIS — M545 Low back pain, unspecified: Secondary | ICD-10-CM

## 2022-04-11 DIAGNOSIS — L409 Psoriasis, unspecified: Secondary | ICD-10-CM

## 2022-04-11 DIAGNOSIS — I7300 Raynaud's syndrome without gangrene: Secondary | ICD-10-CM

## 2022-04-11 DIAGNOSIS — Z8679 Personal history of other diseases of the circulatory system: Secondary | ICD-10-CM

## 2022-04-11 DIAGNOSIS — G8929 Other chronic pain: Secondary | ICD-10-CM

## 2022-04-11 MED ORDER — HYDROCORTISONE 1 % EX CREA: 1.0000 "application " | TOPICAL_CREAM | Freq: Two times a day (BID) | CUTANEOUS | 1 refills | Status: AC

## 2022-04-11 NOTE — Patient Instructions (Signed)
Hand Exercises ?Hand exercises can be helpful for almost anyone. These exercises can strengthen the hands, improve flexibility and movement, and increase blood flow to the hands. These results can make work and daily tasks easier. ?Hand exercises can be especially helpful for people who have joint pain from arthritis or have nerve damage from overuse (carpal tunnel syndrome). ?These exercises can also help people who have injured a hand. ?Exercises ?Most of these hand exercises are gentle stretching and motion exercises. It is usually safe to do them often throughout the day. Warming up your hands before exercise may help to reduce stiffness. You can do this with gentle massage or by placing your hands in warm water for 10-15 minutes. ?It is normal to feel some stretching, pulling, tightness, or mild discomfort as you begin new exercises. This will gradually improve. Stop an exercise right away if you feel sudden, severe pain or your pain gets worse. Ask your health care provider which exercises are best for you. ?Knuckle bend or "claw" fist ? ?Stand or sit with your arm, hand, and all five fingers pointed straight up. Make sure to keep your wrist straight during the exercise. ?Gently bend your fingers down toward your palm until the tips of your fingers are touching the top of your palm. Keep your big knuckle straight and just bend the small knuckles in your fingers. ?Hold this position for __________ seconds. ?Straighten (extend) your fingers back to the starting position. ?Repeat this exercise 5-10 times with each hand. ?Full finger fist ? ?Stand or sit with your arm, hand, and all five fingers pointed straight up. Make sure to keep your wrist straight during the exercise. ?Gently bend your fingers into your palm until the tips of your fingers are touching the middle of your palm. ?Hold this position for __________ seconds. ?Extend your fingers back to the starting position, stretching every joint fully. ?Repeat  this exercise 5-10 times with each hand. ?Straight fist ?Stand or sit with your arm, hand, and all five fingers pointed straight up. Make sure to keep your wrist straight during the exercise. ?Gently bend your fingers at the big knuckle, where your fingers meet your hand, and the middle knuckle. Keep the knuckle at the tips of your fingers straight and try to touch the bottom of your palm. ?Hold this position for __________ seconds. ?Extend your fingers back to the starting position, stretching every joint fully. ?Repeat this exercise 5-10 times with each hand. ?Tabletop ? ?Stand or sit with your arm, hand, and all five fingers pointed straight up. Make sure to keep your wrist straight during the exercise. ?Gently bend your fingers at the big knuckle, where your fingers meet your hand, as far down as you can while keeping the small knuckles in your fingers straight. Think of forming a tabletop with your fingers. ?Hold this position for __________ seconds. ?Extend your fingers back to the starting position, stretching every joint fully. ?Repeat this exercise 5-10 times with each hand. ?Finger spread ? ?Place your hand flat on a table with your palm facing down. Make sure your wrist stays straight as you do this exercise. ?Spread your fingers and thumb apart from each other as far as you can until you feel a gentle stretch. Hold this position for __________ seconds. ?Bring your fingers and thumb tight together again. Hold this position for __________ seconds. ?Repeat this exercise 5-10 times with each hand. ?Making circles ? ?Stand or sit with your arm, hand, and all five fingers pointed   straight up. Make sure to keep your wrist straight during the exercise. ?Make a circle by touching the tip of your thumb to the tip of your index finger. ?Hold for __________ seconds. Then open your hand wide. ?Repeat this motion with your thumb and each finger on your hand. ?Repeat this exercise 5-10 times with each hand. ?Thumb  motion ? ?Sit with your forearm resting on a table and your wrist straight. Your thumb should be facing up toward the ceiling. Keep your fingers relaxed as you move your thumb. ?Lift your thumb up as high as you can toward the ceiling. Hold for __________ seconds. ?Bend your thumb across your palm as far as you can, reaching the tip of your thumb for the small finger (pinkie) side of your palm. Hold for __________ seconds. ?Repeat this exercise 5-10 times with each hand. ?Grip strengthening ? ?Hold a stress ball or other soft ball in the middle of your hand. ?Slowly increase the pressure, squeezing the ball as much as you can without causing pain. Think of bringing the tips of your fingers into the middle of your palm. All of your finger joints should bend when doing this exercise. ?Hold your squeeze for __________ seconds, then relax. ?Repeat this exercise 5-10 times with each hand. ?Contact a health care provider if: ?Your hand pain or discomfort gets much worse when you do an exercise. ?Your hand pain or discomfort does not improve within 2 hours after you exercise. ?If you have any of these problems, stop doing these exercises right away. Do not do them again unless your health care provider says that you can. ?Get help right away if: ?You develop sudden, severe hand pain or swelling. If this happens, stop doing these exercises right away. Do not do them again unless your health care provider says that you can. ?This information is not intended to replace advice given to you by your health care provider. Make sure you discuss any questions you have with your health care provider ? ?Hip Bursitis Rehab ?Ask your health care provider which exercises are safe for you. Do exercises exactly as told by your health care provider and adjust them as directed. It is normal to feel mild stretching, pulling, tightness, or discomfort as you do these exercises. Stop right away if you feel sudden pain or your pain gets worse.  Do not begin these exercises until told by your health care provider. ?Stretching exercise ?This exercise warms up your muscles and joints and improves the movement and flexibility of your hip. This exercise also helps to relieve pain and stiffness. ?Iliotibial band stretch ?An iliotibial band is a strong band of muscle tissue that runs from the outer side of your hip to the outer side of your thigh and knee. ?Lie on your side with your left / right leg in the top position. ?Bend your left / right knee and grab your ankle. Stretch out your bottom arm to help you balance. ?Slowly bring your knee back so your thigh is slightly behind your body. ?Slowly lower your knee toward the floor until you feel a gentle stretch on the outside of your left / right thigh. If you do not feel a stretch and your knee will not lower more toward the floor, place the heel of your other foot on top of your knee and pull your knee down toward the floor with your foot. ?Hold this position for __________ seconds. ?Slowly return to the starting position. ?Repeat __________ times. Complete this  exercise __________ times a day. ?Strengthening exercises ?These exercises build strength and endurance in your hip and pelvis. Endurance is the ability to use your muscles for a long time, even after they get tired. ?Bridge ?This exercise strengthens the muscles that move your thigh backward (hip extensors). ?Lie on your back on a firm surface with your knees bent and your feet flat on the floor. ?Tighten your buttocks muscles and lift your buttocks off the floor until your trunk is level with your thighs. ?Do not arch your back. ?You should feel the muscles working in your buttocks and the back of your thighs. If you do not feel these muscles, slide your feet 1-2 inches (2.5-5 cm) farther away from your buttocks. ?If this exercise is too easy, try doing it with your arms crossed over your chest. ?Hold this position for __________ seconds. ?Slowly  lower your hips to the starting position. ?Let your muscles relax completely after each repetition. ?Repeat __________ times. Complete this exercise __________ times a day. ?Squats ?This exercise strengthens t

## 2022-07-24 DIAGNOSIS — H538 Other visual disturbances: Secondary | ICD-10-CM | POA: Diagnosis not present

## 2022-07-24 DIAGNOSIS — H43812 Vitreous degeneration, left eye: Secondary | ICD-10-CM | POA: Diagnosis not present

## 2022-08-02 DIAGNOSIS — M7989 Other specified soft tissue disorders: Secondary | ICD-10-CM | POA: Diagnosis not present

## 2022-08-02 DIAGNOSIS — M79641 Pain in right hand: Secondary | ICD-10-CM | POA: Diagnosis not present

## 2022-09-26 HISTORY — PX: HAND SURGERY: SHX662

## 2022-10-10 DIAGNOSIS — M654 Radial styloid tenosynovitis [de Quervain]: Secondary | ICD-10-CM | POA: Diagnosis not present

## 2022-10-10 DIAGNOSIS — M1811 Unilateral primary osteoarthritis of first carpometacarpal joint, right hand: Secondary | ICD-10-CM | POA: Diagnosis not present

## 2022-10-10 DIAGNOSIS — G8918 Other acute postprocedural pain: Secondary | ICD-10-CM | POA: Diagnosis not present

## 2022-10-30 DIAGNOSIS — Z1231 Encounter for screening mammogram for malignant neoplasm of breast: Secondary | ICD-10-CM | POA: Diagnosis not present

## 2022-11-02 ENCOUNTER — Encounter: Payer: Self-pay | Admitting: Nurse Practitioner

## 2022-11-14 DIAGNOSIS — M7989 Other specified soft tissue disorders: Secondary | ICD-10-CM | POA: Diagnosis not present

## 2022-11-14 DIAGNOSIS — M1811 Unilateral primary osteoarthritis of first carpometacarpal joint, right hand: Secondary | ICD-10-CM | POA: Diagnosis not present

## 2022-11-14 DIAGNOSIS — M79641 Pain in right hand: Secondary | ICD-10-CM | POA: Diagnosis not present

## 2022-11-14 DIAGNOSIS — M25641 Stiffness of right hand, not elsewhere classified: Secondary | ICD-10-CM | POA: Diagnosis not present

## 2022-12-07 ENCOUNTER — Other Ambulatory Visit: Payer: Self-pay | Admitting: Internal Medicine

## 2022-12-20 ENCOUNTER — Encounter: Payer: Self-pay | Admitting: Internal Medicine

## 2022-12-20 ENCOUNTER — Ambulatory Visit (INDEPENDENT_AMBULATORY_CARE_PROVIDER_SITE_OTHER): Payer: BC Managed Care – PPO | Admitting: Internal Medicine

## 2022-12-20 VITALS — BP 98/62 | HR 71 | Ht 64.0 in | Wt 143.0 lb

## 2022-12-20 DIAGNOSIS — E890 Postprocedural hypothyroidism: Secondary | ICD-10-CM | POA: Diagnosis not present

## 2022-12-20 DIAGNOSIS — E042 Nontoxic multinodular goiter: Secondary | ICD-10-CM | POA: Diagnosis not present
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
## 2022-12-20 NOTE — Patient Instructions (Signed)
Please continue: - Armour 90 mg 1/7 days and 60 mg 6/7 days  Take the thyroid hormone every day, with water, at least 30 minutes before breakfast, separated by at least 4 hours from: - acid reflux medications - calcium - iron - multivitamins  Please stop at the lab.  Please return 1 year.

## 2022-12-21 DIAGNOSIS — M1811 Unilateral primary osteoarthritis of first carpometacarpal joint, right hand: Secondary | ICD-10-CM | POA: Diagnosis not present

## 2022-12-21 DIAGNOSIS — R29898 Other symptoms and signs involving the musculoskeletal system: Secondary | ICD-10-CM | POA: Diagnosis not present

## 2022-12-21 DIAGNOSIS — M25641 Stiffness of right hand, not elsewhere classified: Secondary | ICD-10-CM | POA: Diagnosis not present

## 2022-12-21 DIAGNOSIS — M79641 Pain in right hand: Secondary | ICD-10-CM | POA: Diagnosis not present

## 2022-12-21 LAB — T3, FREE: T3, Free: 2.8 pg/mL (ref 2.3–4.2)

## 2022-12-21 LAB — TSH: TSH: 1.97 u[IU]/mL (ref 0.35–5.50)

## 2022-12-21 LAB — T4, FREE: Free T4: 0.51 ng/dL — ABNORMAL LOW (ref 0.60–1.60)

## 2022-12-21 MED ORDER — ARMOUR THYROID 90 MG PO TABS
ORAL_TABLET | ORAL | 3 refills | Status: DC
Start: 2022-12-21 — End: 2023-03-14

## 2022-12-21 MED ORDER — ARMOUR THYROID 60 MG PO TABS: ORAL_TABLET | ORAL | 3 refills | Status: DC

## 2023-03-13 ENCOUNTER — Telehealth: Payer: Self-pay

## 2023-03-13 DIAGNOSIS — E890 Postprocedural hypothyroidism: Secondary | ICD-10-CM

## 2023-03-13 NOTE — Telephone Encounter (Signed)
Pt turning 67 soon and her new insurance will not cover Armour Thyroid and she would like to know what her options as far as alternatives.

## 2023-03-14 MED ORDER — LEVOTHYROXINE SODIUM 112 MCG PO TABS: 112.0000 ug | ORAL_TABLET | Freq: Every day | ORAL | 5 refills | Status: DC

## 2023-03-14 NOTE — Telephone Encounter (Signed)
T, We can switch her to levothyroxine 112 mcg daily, which is equivalent to the dose that she is taking now.  Lets send a prescription for 45 tablets to her pharmacy with 5 refills.  Lets have her back for a TSH and free T4 (can you please order these?) in approximately 5 weeks.

## 2023-03-14 NOTE — Telephone Encounter (Signed)
Lvm for pt switch her to levothyroxine 112 mcg daily, which is equivalent to the dose that she is taking now.  Lets send a prescription for 45 tablets to her pharmacy with 5 refills.  Lets have her back for a TSH and free T4 in approximately 5 weeks.

## 2023-03-26 DIAGNOSIS — Z86010 Personal history of colonic polyps: Secondary | ICD-10-CM | POA: Diagnosis not present

## 2023-03-26 DIAGNOSIS — Z1211 Encounter for screening for malignant neoplasm of colon: Secondary | ICD-10-CM | POA: Diagnosis not present

## 2023-03-26 DIAGNOSIS — K580 Irritable bowel syndrome with diarrhea: Secondary | ICD-10-CM | POA: Diagnosis not present

## 2023-03-26 DIAGNOSIS — E039 Hypothyroidism, unspecified: Secondary | ICD-10-CM | POA: Diagnosis not present

## 2023-03-29 NOTE — Progress Notes (Signed)
Office Visit Note  Patient: [name]             Date of Birth: 1956/05/11           MRN: [id number]             PCP: Pcp, No Referring: No ref. provider found Visit Date: 04/11/2023 Occupation: @GUAROCC @  Subjective:  Joint stiffness  History of Present Illness: [name] is a 67 y.o. female with history of osteoarthritis, Raynauds and psoriasis.  She states she underwent right [facility] surgery by Dr. Mina Marble in November 2023.  She is gradually recovering from it.  She started playing tennis again.  She was found to have Dupuytren's contracture in her bilateral hands which does not require any further intervention at this time.  She states Raynauds symptoms are mild and not bothersome.  She has intermittent trochanteric bursa discomfort.  She states the pain is more when she is laying down on her side at night.  She denies any discomfort in her knee joints in her feet.  She has intermittent lower back pain.  She denies any SI joint discomfort.  She states psoriasis is mostly in her scalp during the spring season which resolved after using T-Gel shampoo.    Activities of Daily Living:  Patient reports morning stiffness for 0 minutes.   Patient Reports nocturnal pain.  Difficulty dressing/grooming: Denies Difficulty climbing stairs: Denies Difficulty getting out of chair: Denies Difficulty using hands for taps, buttons, cutlery, and/or writing: Denies  Review of Systems  Constitutional:  Negative for fatigue.  HENT:  Negative for mouth sores and mouth dryness.   Eyes:  Negative for dryness.  Respiratory:  Negative for shortness of breath.   Cardiovascular:  Negative for chest pain and palpitations.  Gastrointestinal:  Positive for diarrhea. Negative for blood in stool and constipation.  Endocrine: Negative for increased urination.  Genitourinary:  Negative for involuntary urination.  Musculoskeletal:  Positive for joint pain and joint pain. Negative for gait problem, joint  swelling, myalgias, muscle weakness, morning stiffness, muscle tenderness and myalgias.  Skin:  Negative for color change, rash, hair loss and sensitivity to sunlight.  Allergic/Immunologic: Positive for susceptible to infections.  Neurological:  Negative for dizziness and headaches.  Hematological:  Negative for swollen glands.  Psychiatric/Behavioral:  Negative for depressed mood and sleep disturbance. The patient is not nervous/anxious.     PMFS History:  Patient Active Problem List   Diagnosis Date Noted   Psoriasis 04/03/2018   Postablative hypothyroidism 01/30/2017   Reactive arthritis (HCC) 11/01/2016   Primary osteoarthritis of both knees 11/01/2016   Primary osteoarthritis of both hands 11/01/2016   Primary osteoarthritis of both feet 11/01/2016   Leukocytosis    Hyperlipidemia 05/24/2015   C. difficile colitis 05/24/2015   Hypokalemia 05/24/2015   Hyperglycemia 05/24/2015   Pain of right thigh 10/20/2014   Other iron deficiency anemias 08/05/2014   Palpitations 08/21/2013   Personal history of endometriosis 02/04/2013   Fibroid uterus 02/04/2013   Varicose veins of lower extremities with other complications 06/03/2012   Varicosities of leg 03/10/2012   Multiple thyroid nodules 01/22/2012    Past Medical History:  Diagnosis Date   Arrhythmia 2013   PVCs,-monitor   Arthritis    Colon polyps 2008   BENIGN   Depression    PMDD   Hyperlipidemia    IBS (irritable bowel syndrome)    Kidney infection    Osteopenia 08/2018   T score -1.5 FRAX 7.3% /  0.4%   Palpitations    Postablative hypothyroidism    Raynaud's phenomenon    Rotator cuff disorder    TEAR-    Family History  Problem Relation Age of Onset   Hypertension Father    Lung cancer Father    Brain cancer Father    Hypertension Mother    Heart attack Mother 40   Osteoporosis Mother    Obesity Brother    Obesity Brother    Heart attack Maternal Grandfather    Migraines Daughter    Past Surgical  History:  Procedure Laterality Date   DOPPLER ECHOCARDIOGRAPHY  08/14/2008   EF => 55% , no valvular pathology   EVENT MONITOR  08/15/2012-08/28/2012   NSR with PVC's ,PSVT vs Aflutter WITH 2:1   FINGER SURGERY Left 07/2021   cyst removed from left index finger   HAND SURGERY Left    HAND SURGERY Right 09/2022   right thumb   NM MYOCAR PERF WALL MOTION  09/04/2012   EF 66% ,LV normal, exercise [place] 11 mets -completeing stae 3 and 1 minute into stage 4,developed some mild chest pain and throat  tightness caused to stop exercise. [facility] TM scor  2 - MOD RISK   OOPHORECTOMY  2010   LAP RSO   right heel surgery Right 04/2018   ROTATOR CUFF REPAIR     THYROID LOBECTOMY     RIGHT   Social History   Social History Narrative   Not on file   Immunization History  Administered Date(s) Administered   Influenza,inj,Quad PF,6+ Mos 12/27/2016, 08/10/2019, 10/07/2020   PFIZER(Purple Top)SARS-COV-2 Vaccination 02/06/2020, 02/27/2020   Tdap 01/23/2013     Objective: Vital Signs: BP 108/72 (BP Location: Left Arm, Patient Position: Sitting, Cuff Size: Normal)   Pulse (!) 58   Resp 14   Ht 5\' 4"  (1.626 m)   Wt 141 lb (64 kg)   LMP 03/18/2015 (Approximate)   BMI 24.20 kg/m    Physical Exam Vitals and nursing note reviewed.  Constitutional:      Appearance: She is well-developed.  HENT:     Head: Normocephalic and atraumatic.  Eyes:     Conjunctiva/sclera: Conjunctivae normal.  Cardiovascular:     Rate and Rhythm: Normal rate and regular rhythm.     Heart sounds: Normal heart sounds.  Pulmonary:     Effort: Pulmonary effort is normal.     Breath sounds: Normal breath sounds.  Abdominal:     General: Bowel sounds are normal.     Palpations: Abdomen is soft.  Musculoskeletal:     Cervical back: Normal range of motion.  Lymphadenopathy:     Cervical: No cervical adenopathy.  Skin:    General: Skin is warm and dry.     Capillary Refill: Capillary refill takes less than 2  seconds.  Neurological:     Mental Status: She is alert and oriented to person, place, and time.  Psychiatric:        Behavior: Behavior normal.      Musculoskeletal Exam: Cervical, thoracic and lumbar spine were in good range of motion.  She had no SI joint tenderness.  Shoulders, elbow joints, wrist joints, MCPs PIPs and DIPs with good range of motion.  She has surgical scar over bilateral CMC's.  PIP and DIP thickening with no synovitis was noted.  Dupuytren's contractures were noted in the right third and fourth flexor tendon and left fourth flexor tendon.  Hip joints and knee joints in good range of motion  without any warmth swelling or effusion.  There was no tenderness over ankles or MTPs.  There was no Achilles tendinitis of Planter fasciitis.  CDAI Exam: CDAI Score: -- Patient Global: --; Provider Global: -- Swollen: --; Tender: -- Joint Exam 04/11/2023   No joint exam has been documented for this visit   There is currently no information documented on the homunculus. Go to the Rheumatology activity and complete the homunculus joint exam.  Investigation: No additional findings.  Imaging: No results found.  Recent Labs: Lab Results  Component Value Date   WBC 4.4 03/12/2022   HGB 12.2 03/12/2022   PLT 270 03/12/2022   NA 139 03/12/2022   K 4.4 03/12/2022   CL 102 03/12/2022   CO2 31 03/12/2022   GLUCOSE 94 03/12/2022   BUN 15 03/12/2022   CREATININE 0.77 03/12/2022   BILITOT 0.4 03/12/2022   ALKPHOS 57 06/17/2017   AST 23 03/12/2022   ALT 24 03/12/2022   PROT 7.0 03/12/2022   ALBUMIN 3.9 06/17/2017   CALCIUM 9.4 03/12/2022   GFRAA >60 05/25/2015    Speciality Comments: No specialty comments available.  Procedures:  No procedures performed Allergies: Lomotil [diphenoxylate]   Assessment / Plan:     Visit Diagnoses: Primary osteoarthritis of both hands - Status post right [facility] surgery November 2023 by Dr. Mina Marble.  She is gradually going from surgery and  started playing tennis again.  She had left CMC surgery in the past.  She is pleased with the results.  She has some PIP and DIP thickening in her hands and has stiffness after activities.  Joint protection was discussed.  Dupuytren's contracture of both hands - Right third and fourth fingers, left fourth finger.  Patient states Dr. Mina Marble also evaluated her Dupuytren's contracture and did not advise any treatment.  Trochanteric bursitis of both hips-she has intermittent discomfort in the trochanteric bursa especially when she is laying on her side at nighttime.  Using a pillow top on her mattress was advised.  Stretching exercises were advised.  Primary osteoarthritis of both knees-she had good range of motion without any discomfort.  She is not having pain in her knee joints.  Primary osteoarthritis of both feet-she denies any history of Planter fasciitis or Achilles tendinitis.  She has been wearing proper fitting shoes and not having much discomfort in her feet.  Chronic midline low back pain without sciatica -she has intermittent discomfort in her back.  She had good mobility without any point tenderness today.  X-ray showed mild levoscoliosis, mild degenerative disc disease and facet joint arthropathy.  Psoriasis-patient reports rises only during the spring season which responds T-Gel shampoo.  Raynaud's disease without gangrene-patient states her symptoms are mild and only during winter months.  She denies any sclerodactyly or digital ulcers.  Osteopenia of multiple sites -I reviewed DEXA scan from.  May 2023 DEXA scan T-score -1.6 BMD 0.875 lumbar, T-score -1.6, BMD 0.670 right femoral neck.  Use of calcium rich diet and vitamin D was advised.  Her vitamin D several years ago was normal.  The medical problems are listed as follows:  History of hypothyroidism  History of hypertension-blood pressure was normal today at 102/72.  History of hyperlipidemia  History of attention deficit  disorder  Orders: No orders of the defined types were placed in this encounter.  No orders of the defined types were placed in this encounter.    Follow-Up Instructions: Return in about 1 year (around 04/10/2024) for Osteoarthritis.   Janalyn Rouse  Estanislado Pandy, MD  Note - This record has been created using Editor, commissioning.  Chart creation errors have been sought, but may not always  have been located. Such creation errors do not reflect on  the standard of medical care.

## 2023-04-09 DIAGNOSIS — M7989 Other specified soft tissue disorders: Secondary | ICD-10-CM | POA: Diagnosis not present

## 2023-04-09 DIAGNOSIS — M79641 Pain in right hand: Secondary | ICD-10-CM | POA: Diagnosis not present

## 2023-04-11 ENCOUNTER — Encounter: Payer: Self-pay | Admitting: Rheumatology

## 2023-04-11 ENCOUNTER — Ambulatory Visit: Payer: Medicare Other | Attending: Rheumatology | Admitting: Rheumatology

## 2023-04-11 VITALS — BP 108/72 | HR 58 | Resp 14 | Ht 64.0 in | Wt 141.0 lb

## 2023-04-11 DIAGNOSIS — Z8639 Personal history of other endocrine, nutritional and metabolic disease: Secondary | ICD-10-CM

## 2023-04-11 DIAGNOSIS — M7061 Trochanteric bursitis, right hip: Secondary | ICD-10-CM

## 2023-04-11 DIAGNOSIS — M19042 Primary osteoarthritis, left hand: Secondary | ICD-10-CM

## 2023-04-11 DIAGNOSIS — G8929 Other chronic pain: Secondary | ICD-10-CM

## 2023-04-11 DIAGNOSIS — M720 Palmar fascial fibromatosis [Dupuytren]: Secondary | ICD-10-CM

## 2023-04-11 DIAGNOSIS — M19072 Primary osteoarthritis, left ankle and foot: Secondary | ICD-10-CM

## 2023-04-11 DIAGNOSIS — Z8659 Personal history of other mental and behavioral disorders: Secondary | ICD-10-CM

## 2023-04-11 DIAGNOSIS — M545 Low back pain, unspecified: Secondary | ICD-10-CM

## 2023-04-11 DIAGNOSIS — M19041 Primary osteoarthritis, right hand: Secondary | ICD-10-CM | POA: Diagnosis not present

## 2023-04-11 DIAGNOSIS — I7300 Raynaud's syndrome without gangrene: Secondary | ICD-10-CM

## 2023-04-11 DIAGNOSIS — L409 Psoriasis, unspecified: Secondary | ICD-10-CM

## 2023-04-11 DIAGNOSIS — M19071 Primary osteoarthritis, right ankle and foot: Secondary | ICD-10-CM

## 2023-04-11 DIAGNOSIS — M170 Bilateral primary osteoarthritis of knee: Secondary | ICD-10-CM | POA: Diagnosis not present

## 2023-04-11 DIAGNOSIS — M67442 Ganglion, left hand: Secondary | ICD-10-CM

## 2023-04-11 DIAGNOSIS — Z8679 Personal history of other diseases of the circulatory system: Secondary | ICD-10-CM

## 2023-04-11 DIAGNOSIS — M7062 Trochanteric bursitis, left hip: Secondary | ICD-10-CM

## 2023-04-11 DIAGNOSIS — M8589 Other specified disorders of bone density and structure, multiple sites: Secondary | ICD-10-CM

## 2023-04-12 DIAGNOSIS — K080 Exfoliation of teeth due to systemic causes: Secondary | ICD-10-CM | POA: Diagnosis not present

## 2023-05-23 ENCOUNTER — Other Ambulatory Visit (INDEPENDENT_AMBULATORY_CARE_PROVIDER_SITE_OTHER): Payer: Medicare Other

## 2023-05-23 DIAGNOSIS — E890 Postprocedural hypothyroidism: Secondary | ICD-10-CM

## 2023-05-24 LAB — TSH: TSH: 0.04 u[IU]/mL — ABNORMAL LOW (ref 0.35–5.50)

## 2023-05-24 LAB — T4, FREE: Free T4: 1.2 ng/dL (ref 0.60–1.60)

## 2023-05-24 MED ORDER — LEVOTHYROXINE SODIUM 100 MCG PO TABS: 100.0000 ug | ORAL_TABLET | Freq: Every day | ORAL | 3 refills | Status: DC

## 2023-05-24 NOTE — Addendum Note (Signed)
Addended by: Carlus Pavlov on: 05/24/2023 04:43 PM   Modules accepted: Orders

## 2023-07-16 ENCOUNTER — Other Ambulatory Visit (INDEPENDENT_AMBULATORY_CARE_PROVIDER_SITE_OTHER): Payer: Medicare Other

## 2023-07-16 DIAGNOSIS — E890 Postprocedural hypothyroidism: Secondary | ICD-10-CM

## 2023-07-19 LAB — T4, FREE: Free T4: 1.16 ng/dL (ref 0.60–1.60)

## 2023-07-19 LAB — TSH: TSH: 0.15 u[IU]/mL — ABNORMAL LOW (ref 0.35–5.50)

## 2023-07-19 MED ORDER — LEVOTHYROXINE SODIUM 88 MCG PO TABS: 88.0000 ug | ORAL_TABLET | Freq: Every day | ORAL | 3 refills | Status: DC

## 2023-08-07 DIAGNOSIS — Z23 Encounter for immunization: Secondary | ICD-10-CM | POA: Diagnosis not present

## 2023-08-07 DIAGNOSIS — H6123 Impacted cerumen, bilateral: Secondary | ICD-10-CM | POA: Diagnosis not present

## 2023-08-07 DIAGNOSIS — M8580 Other specified disorders of bone density and structure, unspecified site: Secondary | ICD-10-CM | POA: Diagnosis not present

## 2023-08-07 DIAGNOSIS — Z136 Encounter for screening for cardiovascular disorders: Secondary | ICD-10-CM | POA: Diagnosis not present

## 2023-08-07 DIAGNOSIS — Z0000 Encounter for general adult medical examination without abnormal findings: Secondary | ICD-10-CM | POA: Diagnosis not present

## 2023-08-07 DIAGNOSIS — Z79899 Other long term (current) drug therapy: Secondary | ICD-10-CM | POA: Diagnosis not present

## 2023-08-07 DIAGNOSIS — Z1159 Encounter for screening for other viral diseases: Secondary | ICD-10-CM | POA: Diagnosis not present

## 2023-09-02 DIAGNOSIS — H5213 Myopia, bilateral: Secondary | ICD-10-CM | POA: Diagnosis not present

## 2023-09-10 DIAGNOSIS — L570 Actinic keratosis: Secondary | ICD-10-CM | POA: Diagnosis not present

## 2023-09-10 DIAGNOSIS — L218 Other seborrheic dermatitis: Secondary | ICD-10-CM | POA: Diagnosis not present

## 2023-10-09 ENCOUNTER — Other Ambulatory Visit (INDEPENDENT_AMBULATORY_CARE_PROVIDER_SITE_OTHER): Payer: Medicare Other

## 2023-10-09 DIAGNOSIS — E890 Postprocedural hypothyroidism: Secondary | ICD-10-CM

## 2023-10-10 DIAGNOSIS — K080 Exfoliation of teeth due to systemic causes: Secondary | ICD-10-CM | POA: Diagnosis not present

## 2023-10-10 LAB — TSH: TSH: 1.14 u[IU]/mL (ref 0.35–5.50)

## 2023-10-10 LAB — T4, FREE: Free T4: 1.01 ng/dL (ref 0.60–1.60)

## 2023-11-05 DIAGNOSIS — Z1231 Encounter for screening mammogram for malignant neoplasm of breast: Secondary | ICD-10-CM | POA: Diagnosis not present

## 2023-11-07 ENCOUNTER — Encounter: Payer: Self-pay | Admitting: Nurse Practitioner

## 2023-11-15 ENCOUNTER — Other Ambulatory Visit: Payer: Self-pay | Admitting: Internal Medicine

## 2023-11-15 DIAGNOSIS — E890 Postprocedural hypothyroidism: Secondary | ICD-10-CM

## 2023-12-02 ENCOUNTER — Ambulatory Visit (INDEPENDENT_AMBULATORY_CARE_PROVIDER_SITE_OTHER): Payer: Medicare Other | Admitting: Nurse Practitioner

## 2023-12-02 ENCOUNTER — Encounter: Payer: Self-pay | Admitting: Nurse Practitioner

## 2023-12-02 VITALS — BP 116/72 | HR 69 | Ht 64.0 in | Wt 143.2 lb

## 2023-12-02 DIAGNOSIS — M8589 Other specified disorders of bone density and structure, multiple sites: Secondary | ICD-10-CM | POA: Diagnosis not present

## 2023-12-02 DIAGNOSIS — Z780 Asymptomatic menopausal state: Secondary | ICD-10-CM

## 2023-12-02 DIAGNOSIS — B009 Herpesviral infection, unspecified: Secondary | ICD-10-CM | POA: Diagnosis not present

## 2023-12-02 DIAGNOSIS — N94818 Other vulvodynia: Secondary | ICD-10-CM | POA: Diagnosis not present

## 2023-12-02 DIAGNOSIS — Z9189 Other specified personal risk factors, not elsewhere classified: Secondary | ICD-10-CM | POA: Diagnosis not present

## 2023-12-02 DIAGNOSIS — Z01419 Encounter for gynecological examination (general) (routine) without abnormal findings: Secondary | ICD-10-CM

## 2023-12-02 MED ORDER — VALACYCLOVIR HCL 1 G PO TABS: 1000.0000 mg | ORAL_TABLET | ORAL | 1 refills | Status: AC | PRN

## 2023-12-02 MED ORDER — HYDROXYZINE HCL 25 MG PO TABS: 25.0000 mg | ORAL_TABLET | Freq: Every evening | ORAL | 3 refills | Status: AC | PRN

## 2023-12-02 NOTE — Progress Notes (Signed)
 [name] [birth year] [id number]   History:  68 y.o. G2P2002 presents for annual exam. Postmenopausal. Has not been using the vaginal estrogen, doing OK with just lubricant. Hydroxyzine  initially prescribed by Urogynecology for vulvar discomfort and she would like to continue. HSV-1, Valtrex  as needed for outbreaks. Hypothyroidism managed by endocrinology. 2010 RSO for cyst.   Gynecologic History Patient's last menstrual period was 03/18/2015 (approximate).   Contraception: post menopausal status Sexually active: Yes  Health maintenance Last Pap: 06/14/2020. Results were: Normal neg HPV Last mammogram: 11/05/2023. Results were: Normal Last colonoscopy: 11/10/2018. Results were: Polyps, 5-year follow up Last Dexa: 04/03/2022. Results were: T-score -1.6 FRAX 17% / 1%  Past medical history, past surgical history, family history and social history were all reviewed and documented in the EPIC chart. Married. Housewife. 2 children, one in [place] and in [place], [place]. Mother with osteoporosis.   ROS:  A ROS was performed and pertinent positives and negatives are included.  Exam:  Vitals:   12/02/23 1035  BP: 116/72  Pulse: 69  SpO2: 100%  Weight: 143 lb 3.2 oz (65 kg)  Height: 5' 4 (1.626 m)     Body mass index is 24.58 kg/m.  General appearance:  Normal Thyroid :  Symmetrical, normal in size, without palpable masses or nodularity. Respiratory  Auscultation:  Clear without wheezing or rhonchi Cardiovascular  Auscultation:  Regular rate, without rubs, murmurs or gallops  Edema/varicosities:  Not grossly evident Abdominal  Soft,nontender, without masses, guarding or rebound.  Liver/spleen:  No organomegaly noted  Hernia:  None appreciated  Skin  Inspection:  Grossly normal   Breasts: Examined lying and sitting.   Right: Without masses, retractions, discharge or axillary adenopathy.   Left: Without masses, retractions, discharge or axillary adenopathy. Pelvic:  External genitalia:  no lesions              Urethra:  normal appearing urethra with no masses, tenderness or lesions              Bartholins and Skenes: normal                 Vagina: normal appearing vagina with normal color and discharge, no lesions. Atrophic changes              Cervix: no lesions Bimanual Exam:  Uterus:  no masses or tenderness              Adnexa: no mass, fullness, tenderness              Rectovaginal: Deferred              Anus:  normal, no lesions  Patient informed chaperone available to be present for breast and pelvic exam. Patient has requested no chaperone to be present. Patient has been advised what will be completed during breast and pelvic exam.   Assessment/Plan:  68 y.o. G2P2002 for annual exam.  Encounter for breast and pelvic examination - Education provided on SBEs, importance of preventative screenings, current guidelines, high calcium diet, regular exercise, and multivitamin daily. Labs with PCP.   Postmenopausal - no HRT, no bleeding  Vulvar discomfort - Plan: hydrOXYzine  (ATARAX ) 25 MG tablet at bedtime as needed. Initially prescribed by Urogynecology and wants to continue. Stopped vaginal estrogen. Will reach out if she decides to restart.   HSV-1 infection - Plan: valACYclovir  (VALTREX ) 1000 MG tablet as needed for outbreaks. Occasional outbreaks.   Osteopenia of multiple sites - 03/2022 T-score -1.6 wihtout elevated FRAX. Continue Vitamin  D supplement, high calcium diet, and regular exercise. She is very active with pickle ball and golf. Will schedule DXA in May/June at [facility].  Screening for cervical cancer - 2018 ASCUS neg HR HPV, otherwise normal pap history. Will repeat at 5-year interval per guidelines.  Screening for breast cancer - Normal mammogram history.  Continue annual screenings.  Normal breast exam today.  Screening for colon cancer - 2019 colonoscopy. Scheduled this month.   Return in about 1 year (around 12/01/2024) for Med follow  up, 2 years for B&P.      [name] [facility], 10:58 AM 12/02/2023

## 2023-12-23 ENCOUNTER — Ambulatory Visit: Payer: Medicare Other | Admitting: Internal Medicine

## 2023-12-23 VITALS — BP 120/70 | HR 70 | Ht 64.0 in | Wt 142.2 lb

## 2023-12-23 DIAGNOSIS — E890 Postprocedural hypothyroidism: Secondary | ICD-10-CM

## 2023-12-23 DIAGNOSIS — E042 Nontoxic multinodular goiter: Secondary | ICD-10-CM
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
## 2023-12-23 NOTE — Patient Instructions (Signed)
Please continue Levothyroxine 88 mcg daily.  Take the thyroid hormone every day, with water, at least 30 minutes before breakfast, separated by at least 4 hours from: - acid reflux medications - calcium - iron - multivitamins  Please stop at the lab.  Please return 1 year.

## 2023-12-24 ENCOUNTER — Encounter: Payer: Self-pay | Admitting: Internal Medicine

## 2023-12-24 LAB — TSH: TSH: 1.05 m[IU]/L (ref 0.40–4.50)

## 2023-12-24 LAB — T4, FREE: Free T4: 1.2 ng/dL (ref 0.8–1.8)

## 2023-12-24 MED ORDER — LEVOTHYROXINE SODIUM 88 MCG PO TABS: 88.0000 ug | ORAL_TABLET | Freq: Every day | ORAL | 3 refills | Status: DC

## 2023-12-30 DIAGNOSIS — Z1211 Encounter for screening for malignant neoplasm of colon: Secondary | ICD-10-CM | POA: Diagnosis not present

## 2023-12-30 DIAGNOSIS — K635 Polyp of colon: Secondary | ICD-10-CM | POA: Diagnosis not present

## 2023-12-30 DIAGNOSIS — Z860101 Personal history of adenomatous and serrated colon polyps: Secondary | ICD-10-CM | POA: Diagnosis not present

## 2023-12-30 DIAGNOSIS — Z860102 Personal history of hyperplastic colon polyps: Secondary | ICD-10-CM | POA: Diagnosis not present

## 2023-12-30 DIAGNOSIS — Z860100 Personal history of colon polyps, unspecified: Secondary | ICD-10-CM | POA: Diagnosis not present

## 2023-12-30 DIAGNOSIS — D120 Benign neoplasm of cecum: Secondary | ICD-10-CM | POA: Diagnosis not present

## 2023-12-30 LAB — HM COLONOSCOPY

## 2024-01-11 ENCOUNTER — Other Ambulatory Visit: Payer: Self-pay | Admitting: Internal Medicine

## 2024-03-05 DIAGNOSIS — H43812 Vitreous degeneration, left eye: Secondary | ICD-10-CM | POA: Diagnosis not present

## 2024-03-05 DIAGNOSIS — H43811 Vitreous degeneration, right eye: Secondary | ICD-10-CM | POA: Diagnosis not present

## 2024-03-05 DIAGNOSIS — H43313 Vitreous membranes and strands, bilateral: Secondary | ICD-10-CM | POA: Diagnosis not present

## 2024-03-05 DIAGNOSIS — H538 Other visual disturbances: Secondary | ICD-10-CM | POA: Diagnosis not present

## 2024-04-02 NOTE — Progress Notes (Deleted)
 Office Visit Note  Patient: [name]             Date of Birth: [birth year]           MRN: [id number]             PCP: Pcp, No Referring: No ref. provider found Visit Date: 04/09/2024 Occupation: @GUAROCC @  Subjective:  No chief complaint on file.   History of Present Illness: [name] is a 68 y.o. female ***     Activities of Daily Living:  Patient reports morning stiffness for *** {minute/hour:19697}.   Patient {ACTIONS;DENIES/REPORTS:21021675::"Denies"} nocturnal pain.  Difficulty dressing/grooming: {ACTIONS;DENIES/REPORTS:21021675::"Denies"} Difficulty climbing stairs: {ACTIONS;DENIES/REPORTS:21021675::"Denies"} Difficulty getting out of chair: {ACTIONS;DENIES/REPORTS:21021675::"Denies"} Difficulty using hands for taps, buttons, cutlery, and/or writing: {ACTIONS;DENIES/REPORTS:21021675::"Denies"}  No Rheumatology ROS completed.   PMFS History:  Patient Active Problem List   Diagnosis Date Noted   Psoriasis 04/03/2018   Postablative hypothyroidism 01/30/2017   Reactive arthritis (HCC) 11/01/2016   Primary osteoarthritis of both knees 11/01/2016   Primary osteoarthritis of both hands 11/01/2016   Primary osteoarthritis of both feet 11/01/2016   Leukocytosis    Hyperlipidemia 05/24/2015   C. difficile colitis 05/24/2015   Hypokalemia 05/24/2015   Hyperglycemia 05/24/2015   Pain of right thigh 10/20/2014   Other iron deficiency anemias 08/05/2014   Palpitations 08/21/2013   Personal history of endometriosis 02/04/2013   Fibroid uterus 02/04/2013   Varicose veins of bilateral lower extremities with other complications 06/03/2012   Varicosities of leg 03/10/2012   Multiple thyroid  nodules 01/22/2012    Past Medical History:  Diagnosis Date   Arrhythmia 2013   PVCs,-monitor   Arthritis    Colon polyps 2008   BENIGN   Depression    PMDD   Hyperlipidemia    IBS (irritable bowel syndrome)    Kidney infection    Osteopenia 08/2018   T score -1.5  FRAX 7.3% / 0.4%   Palpitations    Postablative hypothyroidism    Raynaud's phenomenon    Rotator cuff disorder    TEAR-    Family History  Problem Relation Age of Onset   Hypertension Father    Lung cancer Father    Brain cancer Father    Hypertension Mother    Heart attack Mother 51   Osteoporosis Mother    Obesity Brother    Obesity Brother    Heart attack Maternal Grandfather    Migraines Daughter    Past Surgical History:  Procedure Laterality Date   DOPPLER ECHOCARDIOGRAPHY  08/14/2008   EF => 55% , no valvular pathology   EVENT MONITOR  08/15/2012-08/28/2012   NSR with PVC's ,PSVT vs Aflutter WITH 2:1   FINGER SURGERY Left 07/2021   cyst removed from left index finger   HAND SURGERY Left    HAND SURGERY Right 09/2022   right thumb   NM MYOCAR PERF WALL MOTION  09/04/2012   EF 66% ,LV normal, exercise capcity 11 mets -completeing stae 3 and 1 minute into stage 4,developed some mild chest pain and throat  tightness caused to stop exercise. [facility] TM scor  2 - MOD RISK   OOPHORECTOMY  2010   LAP RSO   right heel surgery Right 04/2018   ROTATOR CUFF REPAIR     THYROID  LOBECTOMY     RIGHT   Social History   Social History Narrative   Not on file   Immunization History  Administered Date(s) Administered   Influenza,inj,Quad PF,6+ Mos 12/27/2016, 08/10/2019, 10/07/2020  PFIZER(Purple Top)SARS-COV-2 Vaccination 02/06/2020, 02/27/2020   Tdap 01/23/2013     Objective: Vital Signs: LMP 03/18/2015 (Approximate)    Physical Exam   Musculoskeletal Exam: ***  CDAI Exam: CDAI Score: -- Patient Global: --; Provider Global: -- Swollen: --; Tender: -- Joint Exam 04/09/2024   No joint exam has been documented for this visit   There is currently no information documented on the homunculus. Go to the Rheumatology activity and complete the homunculus joint exam.  Investigation: No additional findings.  Imaging: No results found.  Recent Labs: Lab Results   Component Value Date   WBC 4.4 03/12/2022   HGB 12.2 03/12/2022   PLT 270 03/12/2022   NA 139 03/12/2022   K 4.4 03/12/2022   CL 102 03/12/2022   CO2 31 03/12/2022   GLUCOSE 94 03/12/2022   BUN 15 03/12/2022   CREATININE 0.77 03/12/2022   BILITOT 0.4 03/12/2022   ALKPHOS 57 06/17/2017   AST 23 03/12/2022   ALT 24 03/12/2022   PROT 7.0 03/12/2022   ALBUMIN 3.9 06/17/2017   CALCIUM 9.4 03/12/2022   GFRAA >60 05/25/2015    Speciality Comments: No specialty comments available.  Procedures:  No procedures performed Allergies: Lomotil [diphenoxylate]   Assessment / Plan:     Visit Diagnoses: No diagnosis found.  Orders: No orders of the defined types were placed in this encounter.  No orders of the defined types were placed in this encounter.   Face-to-face time spent with patient was *** minutes. Greater than 50% of time was spent in counseling and coordination of care.  Follow-Up Instructions: No follow-ups on file.   Dee Farber, CMA  Note - This record has been created using Animal nutritionist.  Chart creation errors have been sought, but may not always  have been located. Such creation errors do not reflect on  the standard of medical care.

## 2024-04-09 ENCOUNTER — Ambulatory Visit: Payer: Medicare Other | Admitting: Rheumatology

## 2024-04-09 DIAGNOSIS — L409 Psoriasis, unspecified: Secondary | ICD-10-CM

## 2024-04-09 DIAGNOSIS — Z8679 Personal history of other diseases of the circulatory system: Secondary | ICD-10-CM

## 2024-04-09 DIAGNOSIS — Z8639 Personal history of other endocrine, nutritional and metabolic disease: Secondary | ICD-10-CM

## 2024-04-09 DIAGNOSIS — Z8659 Personal history of other mental and behavioral disorders: Secondary | ICD-10-CM

## 2024-04-09 DIAGNOSIS — I7300 Raynaud's syndrome without gangrene: Secondary | ICD-10-CM

## 2024-04-09 DIAGNOSIS — M19041 Primary osteoarthritis, right hand: Secondary | ICD-10-CM

## 2024-04-09 DIAGNOSIS — M720 Palmar fascial fibromatosis [Dupuytren]: Secondary | ICD-10-CM

## 2024-04-09 DIAGNOSIS — M545 Low back pain, unspecified: Secondary | ICD-10-CM

## 2024-04-09 DIAGNOSIS — M8589 Other specified disorders of bone density and structure, multiple sites: Secondary | ICD-10-CM

## 2024-04-09 DIAGNOSIS — M170 Bilateral primary osteoarthritis of knee: Secondary | ICD-10-CM

## 2024-04-09 DIAGNOSIS — M19072 Primary osteoarthritis, left ankle and foot: Secondary | ICD-10-CM

## 2024-04-09 DIAGNOSIS — M7061 Trochanteric bursitis, right hip: Secondary | ICD-10-CM

## 2024-04-24 NOTE — Progress Notes (Signed)
 Office Visit Note  Patient: [name]             Date of Birth: Oct 03, 1956           MRN: [id number]             PCP: Pcp, No Referring: No ref. provider found Visit Date: 05/08/2024 Occupation: @GUAROCC @  Subjective:  Lower back pain  History of Present Illness: [name] is a 68 y.o. female with osteoarthritis, Raynauds and psoriasis.  She returns today after last visit in May 2024.  She has not had any psoriasis flares.  She has been using topical agents.  She continues to have discomfort in her hands and lower back.  She states she has not had much trouble with her trochanteric bursitis or knee joints.  She has had no recent episodes of plantar fasciitis or Achilles tendinitis.  She denies any episodes of uveitis.  She has been playing tennis and golf on a regular basis.    Activities of Daily Living:  Patient reports morning stiffness for 5 minutes.   Patient Reports nocturnal pain.  Difficulty dressing/grooming: Denies Difficulty climbing stairs: Denies Difficulty getting out of chair: Denies Difficulty using hands for taps, buttons, cutlery, and/or writing: Denies  Review of Systems  Constitutional:  Negative for fatigue.  HENT:  Negative for mouth sores and mouth dryness.   Eyes:  Negative for dryness.  Respiratory:  Negative for shortness of breath.   Cardiovascular:  Negative for chest pain and palpitations.  Gastrointestinal:  Positive for diarrhea. Negative for blood in stool and constipation.  Endocrine: Negative for increased urination.  Genitourinary:  Negative for involuntary urination.  Musculoskeletal:  Positive for morning stiffness. Negative for joint pain, gait problem, joint pain, joint swelling, myalgias, muscle weakness, muscle tenderness and myalgias.  Skin:  Negative for color change, rash, hair loss and sensitivity to sunlight.  Allergic/Immunologic: Negative for susceptible to infections.  Neurological:  Negative for dizziness and  headaches.  Hematological:  Negative for swollen glands.  Psychiatric/Behavioral:  Negative for depressed mood and sleep disturbance. The patient is not nervous/anxious.     PMFS History:  Patient Active Problem List   Diagnosis Date Noted   Psoriasis 04/03/2018   Postablative hypothyroidism 01/30/2017   Reactive arthritis (HCC) 11/01/2016   Primary osteoarthritis of both knees 11/01/2016   Primary osteoarthritis of both hands 11/01/2016   Primary osteoarthritis of both feet 11/01/2016   Leukocytosis    Hyperlipidemia 05/24/2015   C. difficile colitis 05/24/2015   Hypokalemia 05/24/2015   Hyperglycemia 05/24/2015   Pain of right thigh 10/20/2014   Other iron deficiency anemias 08/05/2014   Palpitations 08/21/2013   Personal history of endometriosis 02/04/2013   Fibroid uterus 02/04/2013   Varicose veins of bilateral lower extremities with other complications 06/03/2012   Varicosities of leg 03/10/2012   Multiple thyroid  nodules 01/22/2012    Past Medical History:  Diagnosis Date   Arrhythmia 2013   PVCs,-monitor   Arthritis    Colon polyps 2008   BENIGN   Depression    PMDD   Hyperlipidemia    IBS (irritable bowel syndrome)    Kidney infection    Osteopenia 08/2018   T score -1.5 FRAX 7.3% / 0.4%   Palpitations    Postablative hypothyroidism    Raynaud's phenomenon    Rotator cuff disorder    TEAR-    Family History  Problem Relation Age of Onset   Hypertension Mother    Heart  attack Mother 45   Osteoporosis Mother    Hypertension Father    Lung cancer Father    Brain cancer Father    Obesity Brother    Obesity Brother    Heart attack Maternal Grandfather    Migraines Daughter    Past Surgical History:  Procedure Laterality Date   DOPPLER ECHOCARDIOGRAPHY  08/14/2008   EF => 55% , no valvular pathology   EVENT MONITOR  08/15/2012-08/28/2012   NSR with PVC's ,PSVT vs Aflutter WITH 2:1   FINGER SURGERY Left 07/2021   cyst removed from left index finger    HAND SURGERY Left    HAND SURGERY Right 09/2022   right thumb   NM MYOCAR PERF WALL MOTION  09/04/2012   EF 66% ,LV normal, exercise [place] 11 mets -completeing stae 3 and 1 minute into stage 4,developed some mild chest pain and throat  tightness caused to stop exercise. [facility] TM scor  2 - MOD RISK   OOPHORECTOMY  2010   LAP RSO   right heel surgery Right 04/2018   ROTATOR CUFF REPAIR     THYROID  LOBECTOMY     RIGHT   Social History   Social History Narrative   Not on file   Immunization History  Administered Date(s) Administered   Influenza,inj,Quad PF,6+ Mos 12/27/2016, 08/10/2019, 10/07/2020   PFIZER(Purple Top)SARS-COV-2 Vaccination 02/06/2020, 02/27/2020   Tdap 01/23/2013     Objective: Vital Signs: BP 116/68 (BP Location: Left Arm, Patient Position: Sitting, Cuff Size: Normal)   Pulse 65   Resp 16   Ht 5' 4 (1.626 m)   Wt 143 lb 3.2 oz (65 kg)   LMP 03/18/2015 (Approximate)   BMI 24.58 kg/m    Physical Exam Vitals and nursing note reviewed.  Constitutional:      Appearance: She is well-developed.  HENT:     Head: Normocephalic and atraumatic.   Eyes:     Conjunctiva/sclera: Conjunctivae normal.    Cardiovascular:     Rate and Rhythm: Normal rate and regular rhythm.     Heart sounds: Normal heart sounds.  Pulmonary:     Effort: Pulmonary effort is normal.     Breath sounds: Normal breath sounds.  Abdominal:     General: Bowel sounds are normal.     Palpations: Abdomen is soft.   Musculoskeletal:     Cervical back: Normal range of motion.  Lymphadenopathy:     Cervical: No cervical adenopathy.   Skin:    General: Skin is warm and dry.     Capillary Refill: Capillary refill takes less than 2 seconds.   Neurological:     Mental Status: She is alert and oriented to person, place, and time.   Psychiatric:        Behavior: Behavior normal.      Musculoskeletal Exam: Cervical, thoracic and lumbar spine with good range of motion.  She  complained of some discomfort in the lower lumbar region.  There was no tenderness over SI joints.  Shoulders, elbows, wrist, MCPs PIPs and DIPs were in good range of motion.  She had bilateral PIP and DIP thickening.  Dupuytren's contractures were noted in the bilateral 3rd and 4th flexor tendons.  Hip joints and knee joints were in good range of motion.  She had no tenderness over trochanteric bursa.  There was no tenderness over her ankles or MTPs.  CDAI Exam: CDAI Score: -- Patient Global: --; Provider Global: -- Swollen: --; Tender: -- Joint Exam 05/08/2024  No joint exam has been documented for this visit   There is currently no information documented on the homunculus. Go to the Rheumatology activity and complete the homunculus joint exam.  Investigation: No additional findings.  Imaging: No results found.  Recent Labs: Lab Results  Component Value Date   WBC 4.4 03/12/2022   HGB 12.2 03/12/2022   PLT 270 03/12/2022   NA 139 03/12/2022   K 4.4 03/12/2022   CL 102 03/12/2022   CO2 31 03/12/2022   GLUCOSE 94 03/12/2022   BUN 15 03/12/2022   CREATININE 0.77 03/12/2022   BILITOT 0.4 03/12/2022   ALKPHOS 57 06/17/2017   AST 23 03/12/2022   ALT 24 03/12/2022   PROT 7.0 03/12/2022   ALBUMIN 3.9 06/17/2017   CALCIUM 9.4 03/12/2022   GFRAA >60 05/25/2015    Speciality Comments: No specialty comments available.  Procedures:  No procedures performed Allergies: Lomotil [diphenoxylate]   Assessment / Plan:     Visit Diagnoses: Primary osteoarthritis of both hands - Status post right [facility] surgery November 2023 by Dr. Donzella Galley.  She states her CMC joints are doing well.  She continues to have some stiffness in her hands which is manageable.  Dupuytren's contracture of both hands -bilateral 3rd and 4th fingers.  Patient states it is not interfering with her routine activity.  She was evaluated by hand surgery in the past and no surgery was recommended at that  point.  Primary osteoarthritis of both knees-she continues to have some stiffness in her knee joints.  No warmth swelling or effusion was noted.  Trochanteric bursitis of both hips-currently not symptomatic.  Primary osteoarthritis of both feet-she denies any discomfort.  Proper fitting shoes were advised.  Chronic midline low back pain without sciatica -she continue to have some lower back pain especially in the lower lumbar region.  X-ray obtained in the past showed mild levoscoliosis, mild degenerative disc disease and facet joint arthropathy.  X-ray findings were reviewed.  A handout on back exercises was given.  I also offered physical therapy patient will contact us  if needed.  Psoriasis -she has not had any major psoriasis flares.  Patient response to T-Gel shampoo which she uses on a as needed basis.   Raynaud's disease without gangrene-currently not symptomatic.  Osteopenia of multiple sites - May 2023 DEXA scan T-score -1.6 BMD 0.875 lumbar, T-score -1.6, BMD 0.670 right femoral neck.  Use of calcium rich diet and vitamin D  was advised.  Repeat DEXA scan is pending.  Patient remains active.  Other medical problems listed as follows: History of hypothyroidism  History of hyperlipidemia  History of hypertension-blood pressure was normal at 116/68.  History of attention deficit disorder  Orders: No orders of the defined types were placed in this encounter.  No orders of the defined types were placed in this encounter.    Follow-Up Instructions: Return in about 1 year (around 05/08/2025) for Osteoarthritis.   Nicholas Bari, MD  Note - This record has been created using Animal nutritionist.  Chart creation errors have been sought, but may not always  have been located. Such creation errors do not reflect on  the standard of medical care.

## 2024-05-05 DIAGNOSIS — Z860100 Personal history of colon polyps, unspecified: Secondary | ICD-10-CM | POA: Diagnosis not present

## 2024-05-05 DIAGNOSIS — K580 Irritable bowel syndrome with diarrhea: Secondary | ICD-10-CM | POA: Diagnosis not present

## 2024-05-08 ENCOUNTER — Ambulatory Visit: Attending: Rheumatology | Admitting: Rheumatology

## 2024-05-08 ENCOUNTER — Encounter: Payer: Self-pay | Admitting: Rheumatology

## 2024-05-08 VITALS — BP 116/68 | HR 65 | Resp 16 | Ht 64.0 in | Wt 143.2 lb

## 2024-05-08 DIAGNOSIS — M19071 Primary osteoarthritis, right ankle and foot: Secondary | ICD-10-CM | POA: Diagnosis not present

## 2024-05-08 DIAGNOSIS — L409 Psoriasis, unspecified: Secondary | ICD-10-CM

## 2024-05-08 DIAGNOSIS — M170 Bilateral primary osteoarthritis of knee: Secondary | ICD-10-CM | POA: Diagnosis not present

## 2024-05-08 DIAGNOSIS — M720 Palmar fascial fibromatosis [Dupuytren]: Secondary | ICD-10-CM | POA: Diagnosis not present

## 2024-05-08 DIAGNOSIS — M8589 Other specified disorders of bone density and structure, multiple sites: Secondary | ICD-10-CM

## 2024-05-08 DIAGNOSIS — M19041 Primary osteoarthritis, right hand: Secondary | ICD-10-CM

## 2024-05-08 DIAGNOSIS — Z8679 Personal history of other diseases of the circulatory system: Secondary | ICD-10-CM

## 2024-05-08 DIAGNOSIS — Z8639 Personal history of other endocrine, nutritional and metabolic disease: Secondary | ICD-10-CM

## 2024-05-08 DIAGNOSIS — M19042 Primary osteoarthritis, left hand: Secondary | ICD-10-CM

## 2024-05-08 DIAGNOSIS — G8929 Other chronic pain: Secondary | ICD-10-CM

## 2024-05-08 DIAGNOSIS — M19072 Primary osteoarthritis, left ankle and foot: Secondary | ICD-10-CM

## 2024-05-08 DIAGNOSIS — I7300 Raynaud's syndrome without gangrene: Secondary | ICD-10-CM

## 2024-05-08 DIAGNOSIS — M7061 Trochanteric bursitis, right hip: Secondary | ICD-10-CM

## 2024-05-08 DIAGNOSIS — M545 Low back pain, unspecified: Secondary | ICD-10-CM

## 2024-05-08 DIAGNOSIS — Z8659 Personal history of other mental and behavioral disorders: Secondary | ICD-10-CM

## 2024-05-08 NOTE — Patient Instructions (Signed)
 Low Back Sprain or Strain Rehab Ask your health care provider which exercises are safe for you. Do exercises exactly as told by your health care provider and adjust them as directed. It is normal to feel mild stretching, pulling, tightness, or discomfort as you do these exercises. Stop right away if you feel sudden pain or your pain gets worse. Do not begin these exercises until told by your health care provider. Stretching and range-of-motion exercises These exercises warm up your muscles and joints and improve the movement and flexibility of your back. These exercises also help to relieve pain, numbness, and tingling. Lumbar rotation  Lie on your back on a firm bed or the floor with your knees bent. Straighten your arms out to your sides so each arm forms a 90-degree angle (right angle) with a side of your body. Slowly move (rotate) both of your knees to one side of your body until you feel a stretch in your lower back (lumbar). Try not to let your shoulders lift off the floor. Hold this position for __________ seconds. Tense your abdominal muscles and slowly move your knees back to the starting position. Repeat this exercise on the other side of your body. Repeat __________ times. Complete this exercise __________ times a day. Single knee to chest  Lie on your back on a firm bed or the floor with both legs straight. Bend one of your knees. Use your hands to move your knee up toward your chest until you feel a gentle stretch in your lower back and buttock. Hold your leg in this position by holding on to the front of your knee. Keep your other leg as straight as possible. Hold this position for __________ seconds. Slowly return to the starting position. Repeat with your other leg. Repeat __________ times. Complete this exercise __________ times a day. Prone extension on elbows  Lie on your abdomen on a firm bed or the floor (prone position). Prop yourself up on your elbows. Use your arms  to help lift your chest up until you feel a gentle stretch in your abdomen and your lower back. This will place some of your body weight on your elbows. If this is uncomfortable, try stacking pillows under your chest. Your hips should stay down, against the surface that you are lying on. Keep your hip and back muscles relaxed. Hold this position for __________ seconds. Slowly relax your upper body and return to the starting position. Repeat __________ times. Complete this exercise __________ times a day. Strengthening exercises These exercises build strength and endurance in your back. Endurance is the ability to use your muscles for a long time, even after they get tired. Pelvic tilt This exercise strengthens the muscles that lie deep in the abdomen. Lie on your back on a firm bed or the floor with your legs extended. Bend your knees so they are pointing toward the ceiling and your feet are flat on the floor. Tighten your lower abdominal muscles to press your lower back against the floor. This motion will tilt your pelvis so your tailbone points up toward the ceiling instead of pointing to your feet or the floor. To help with this exercise, you may place a small towel under your lower back and try to push your back into the towel. Hold this position for __________ seconds. Let your muscles relax completely before you repeat this exercise. Repeat __________ times. Complete this exercise __________ times a day. Alternating arm and leg raises  Get on your hands  and knees on a firm surface. If you are on a hard floor, you may want to use padding, such as an exercise mat, to cushion your knees. Line up your arms and legs. Your hands should be directly below your shoulders, and your knees should be directly below your hips. Lift your left leg behind you. At the same time, raise your right arm and straighten it in front of you. Do not lift your leg higher than your hip. Do not lift your arm higher  than your shoulder. Keep your abdominal and back muscles tight. Keep your hips facing the ground. Do not arch your back. Keep your balance carefully, and do not hold your breath. Hold this position for __________ seconds. Slowly return to the starting position. Repeat with your right leg and your left arm. Repeat __________ times. Complete this exercise __________ times a day. Abdominal set with straight leg raise  Lie on your back on a firm bed or the floor. Bend one of your knees and keep your other leg straight. Tense your abdominal muscles and lift your straight leg up, 4-6 inches (10-15 cm) off the ground. Keep your abdominal muscles tight and hold this position for __________ seconds. Do not hold your breath. Do not arch your back. Keep it flat against the ground. Keep your abdominal muscles tense as you slowly lower your leg back to the starting position. Repeat with your other leg. Repeat __________ times. Complete this exercise __________ times a day. Single leg lower with bent knees Lie on your back on a firm bed or the floor. Tense your abdominal muscles and lift your feet off the floor, one foot at a time, so your knees and hips are bent in 90-degree angles (right angles). Your knees should be over your hips and your lower legs should be parallel to the floor. Keeping your abdominal muscles tense and your knee bent, slowly lower one of your legs so your toe touches the ground. Lift your leg back up to return to the starting position. Do not hold your breath. Do not let your back arch. Keep your back flat against the ground. Repeat with your other leg. Repeat __________ times. Complete this exercise __________ times a day. Posture and body mechanics Good posture and healthy body mechanics can help to relieve stress in your body's tissues and joints. Body mechanics refers to the movements and positions of your body while you do your daily activities. Posture is part of body  mechanics. Good posture means: Your spine is in its natural S-curve position (neutral). Your shoulders are pulled back slightly. Your head is not tipped forward (neutral). Follow these guidelines to improve your posture and body mechanics in your everyday activities. Standing  When standing, keep your spine neutral and your feet about hip-width apart. Keep a slight bend in your knees. Your ears, shoulders, and hips should line up. When you do a task in which you stand in one place for a long time, place one foot up on a stable object that is 2-4 inches (5-10 cm) high, such as a footstool. This helps keep your spine neutral. Sitting  When sitting, keep your spine neutral and keep your feet flat on the floor. Use a footrest, if necessary, and keep your thighs parallel to the floor. Avoid rounding your shoulders, and avoid tilting your head forward. When working at a desk or a computer, keep your desk at a height where your hands are slightly lower than your elbows. Slide your  chair under your desk so you are close enough to maintain good posture. When working at a computer, place your monitor at a height where you are looking straight ahead and you do not have to tilt your head forward or downward to look at the screen. Resting When lying down and resting, avoid positions that are most painful for you. If you have pain with activities such as sitting, bending, stooping, or squatting, lie in a position in which your body does not bend very much. For example, avoid curling up on your side with your arms and knees near your chest (fetal position). If you have pain with activities such as standing for a long time or reaching with your arms, lie with your spine in a neutral position and bend your knees slightly. Try the following positions: Lying on your side with a pillow between your knees. Lying on your back with a pillow under your knees. Lifting  When lifting objects, keep your feet at least  shoulder-width apart and tighten your abdominal muscles. Bend your knees and hips and keep your spine neutral. It is important to lift using the strength of your legs, not your back. Do not lock your knees straight out. Always ask for help to lift heavy or awkward objects. This information is not intended to replace advice given to you by your health care provider. Make sure you discuss any questions you have with your health care provider. Document Revised: 03/18/2023 Document Reviewed: 01/30/2021 Elsevier Patient Education  2024 [employer].

## 2024-06-23 ENCOUNTER — Telehealth: Payer: Self-pay

## 2024-06-23 DIAGNOSIS — K080 Exfoliation of teeth due to systemic causes: Secondary | ICD-10-CM | POA: Diagnosis not present

## 2024-06-23 NOTE — Telephone Encounter (Signed)
 Patient called & needed an order for her bmd to [facility]. Order form placed in Tiffany, NP office & I will fax it on Tuesday. Patient is aware.

## 2024-07-30 DIAGNOSIS — M85852 Other specified disorders of bone density and structure, left thigh: Secondary | ICD-10-CM | POA: Diagnosis not present

## 2024-07-30 DIAGNOSIS — M85851 Other specified disorders of bone density and structure, right thigh: Secondary | ICD-10-CM | POA: Diagnosis not present

## 2024-07-30 LAB — HM DEXA SCAN

## 2024-08-03 ENCOUNTER — Encounter: Payer: Self-pay | Admitting: Nurse Practitioner

## 2024-08-04 ENCOUNTER — Ambulatory Visit: Payer: Self-pay | Admitting: Nurse Practitioner

## 2024-08-25 DIAGNOSIS — H5213 Myopia, bilateral: Secondary | ICD-10-CM | POA: Diagnosis not present

## 2024-10-14 ENCOUNTER — Other Ambulatory Visit: Payer: Self-pay | Admitting: Internal Medicine

## 2024-11-17 LAB — HM MAMMOGRAPHY

## 2024-12-02 ENCOUNTER — Encounter: Payer: Self-pay | Admitting: Nurse Practitioner

## 2024-12-02 ENCOUNTER — Ambulatory Visit: Payer: Medicare Other | Admitting: Nurse Practitioner

## 2024-12-02 VITALS — BP 116/70 | HR 80 | Ht 63.25 in | Wt 146.0 lb

## 2024-12-02 DIAGNOSIS — N94818 Other vulvodynia: Secondary | ICD-10-CM

## 2024-12-02 DIAGNOSIS — Z7689 Persons encountering health services in other specified circumstances: Secondary | ICD-10-CM | POA: Diagnosis not present

## 2024-12-02 DIAGNOSIS — B009 Herpesviral infection, unspecified: Secondary | ICD-10-CM

## 2024-12-02 MED ORDER — ACYCLOVIR 5 % EX OINT: 1.0000 | TOPICAL_OINTMENT | Freq: Two times a day (BID) | CUTANEOUS | 1 refills | Status: AC

## 2024-12-02 NOTE — Progress Notes (Signed)
" ° °  [name] 10/19/1956 [id number]   History:  69 y.o. G2P2002 presents for medication management.  Hydroxyzine  initially prescribed by Urogynecology for vulvar discomfort and she would like to continue. Only uses 2-3 times per month. HSV-1, Valtrex  and/or topical acyclovir  for outbreaks. Would like to ose 10-15 pounds. Has not been active, diet high in carbs due to IBS and comfort, and has had increased stress the last 6 months.   Past medical history, past surgical history, family history and social history were all reviewed and documented in the EPIC chart.  ROS:  A ROS was performed and pertinent positives and negatives are included.  Exam:  Vitals:   12/02/24 1514  BP: 116/70  Pulse: 80  SpO2: 97%  Weight: 146 lb (66.2 kg)  Height: 5' 3.25 (1.607 m)   Body mass index is 25.66 kg/m.  General appearance: Normal  Assessment/Plan:  69 y.o. [id number] for medication management.   Vulvar discomfort - Uses hydroxyzine  2-3 times per month. Wants to continue. Does not need refills at this time.   HSV-1 (herpes simplex virus 1) infection - Plan: acyclovir  ointment (ZOVIRAX ) 5 % as needed for outbreaks. Does not need Valtrex  refills at this time.   Encounter for weight management - discussed importance of daily calorie deficit for weight loss. Use app to track this, decrease carb and sugar intake, increase exercise.   Return in about 1 year (around 12/02/2025) for B&P.    Ashlee DELENA Shutter DNP, 3:35 PM 12/02/2024  "

## 2025-05-07 ENCOUNTER — Ambulatory Visit: Admitting: Rheumatology

## 2025-12-06 ENCOUNTER — Encounter: Admitting: Nurse Practitioner
